# Patient Record
Sex: Male | Born: 1950
Health system: Southern US, Community
[De-identification: ages and names within clinical notes are randomized; demographics above are authoritative.]

## PROBLEM LIST (undated history)

## (undated) DIAGNOSIS — I10 Essential (primary) hypertension: Secondary | ICD-10-CM

## (undated) DIAGNOSIS — D72819 Decreased white blood cell count, unspecified: Secondary | ICD-10-CM

## (undated) DIAGNOSIS — E7800 Pure hypercholesterolemia, unspecified: Secondary | ICD-10-CM

## (undated) HISTORY — DX: Pure hypercholesterolemia, unspecified: E78.00

## (undated) HISTORY — DX: Essential (primary) hypertension: I10

## (undated) HISTORY — DX: Decreased white blood cell count, unspecified: D72.819

## (undated) HISTORY — PX: WISDOM TOOTH EXTRACTION: SHX21

---

## 2007-02-13 ENCOUNTER — Ambulatory Visit: Payer: Self-pay | Admitting: Gastroenterology

## 2007-02-13 LAB — HM COLONOSCOPY: HM Colonoscopy: NORMAL

## 2008-01-01 ENCOUNTER — Ambulatory Visit: Payer: Self-pay | Admitting: Internal Medicine

## 2008-01-16 ENCOUNTER — Ambulatory Visit: Payer: Self-pay | Admitting: Internal Medicine

## 2008-02-01 ENCOUNTER — Ambulatory Visit: Payer: Self-pay | Admitting: Internal Medicine

## 2008-03-03 ENCOUNTER — Ambulatory Visit: Payer: Self-pay | Admitting: Internal Medicine

## 2008-03-31 ENCOUNTER — Ambulatory Visit: Payer: Self-pay | Admitting: Internal Medicine

## 2010-10-13 LAB — LIPID PANEL: HDL: 47 mg/dL (ref 35–70)

## 2011-11-08 ENCOUNTER — Other Ambulatory Visit: Payer: Self-pay | Admitting: *Deleted

## 2011-11-08 MED ORDER — LOSARTAN POTASSIUM 25 MG PO TABS: 25.0000 mg | ORAL_TABLET | Freq: Every day | ORAL | Status: DC

## 2011-11-08 NOTE — Telephone Encounter (Signed)
Rx was faxed to pharmacy.  

## 2011-12-29 ENCOUNTER — Ambulatory Visit: Payer: Self-pay | Admitting: Internal Medicine

## 2012-01-12 ENCOUNTER — Ambulatory Visit: Payer: Self-pay | Admitting: Internal Medicine

## 2012-02-08 ENCOUNTER — Encounter: Payer: Self-pay | Admitting: *Deleted

## 2012-02-08 DIAGNOSIS — E7800 Pure hypercholesterolemia, unspecified: Secondary | ICD-10-CM

## 2012-02-08 DIAGNOSIS — D72819 Decreased white blood cell count, unspecified: Secondary | ICD-10-CM

## 2012-02-08 DIAGNOSIS — I10 Essential (primary) hypertension: Secondary | ICD-10-CM

## 2012-02-09 ENCOUNTER — Encounter: Payer: Self-pay | Admitting: Internal Medicine

## 2012-02-09 ENCOUNTER — Encounter: Payer: Self-pay | Admitting: *Deleted

## 2012-02-09 ENCOUNTER — Ambulatory Visit (INDEPENDENT_AMBULATORY_CARE_PROVIDER_SITE_OTHER): Payer: BC Managed Care – PPO | Admitting: Internal Medicine

## 2012-02-09 VITALS — BP 132/86 | HR 72 | Temp 97.7°F | Ht 72.0 in | Wt 161.0 lb

## 2012-02-09 DIAGNOSIS — D72819 Decreased white blood cell count, unspecified: Secondary | ICD-10-CM | POA: Insufficient documentation

## 2012-02-09 DIAGNOSIS — R5383 Other fatigue: Secondary | ICD-10-CM

## 2012-02-09 DIAGNOSIS — Z23 Encounter for immunization: Secondary | ICD-10-CM

## 2012-02-09 DIAGNOSIS — E7800 Pure hypercholesterolemia, unspecified: Secondary | ICD-10-CM | POA: Insufficient documentation

## 2012-02-09 DIAGNOSIS — I10 Essential (primary) hypertension: Secondary | ICD-10-CM | POA: Insufficient documentation

## 2012-02-09 DIAGNOSIS — R0602 Shortness of breath: Secondary | ICD-10-CM

## 2012-02-09 DIAGNOSIS — R5381 Other malaise: Secondary | ICD-10-CM

## 2012-02-09 LAB — COMPREHENSIVE METABOLIC PANEL
ALT: 28 U/L (ref 0–53)
AST: 30 U/L (ref 0–37)
Alkaline Phosphatase: 54 U/L (ref 39–117)
BUN: 11 mg/dL (ref 6–23)
Calcium: 9 mg/dL (ref 8.4–10.5)
Chloride: 99 mEq/L (ref 96–112)
Creatinine, Ser: 0.9 mg/dL (ref 0.4–1.5)
Total Bilirubin: 0.8 mg/dL (ref 0.3–1.2)

## 2012-02-09 LAB — CBC WITH DIFFERENTIAL/PLATELET
Basophils Absolute: 0 10*3/uL (ref 0.0–0.1)
Basophils Relative: 0.9 % (ref 0.0–3.0)
Eosinophils Absolute: 0 10*3/uL (ref 0.0–0.7)
Hemoglobin: 14.5 g/dL (ref 13.0–17.0)
Lymphocytes Relative: 39.5 % (ref 12.0–46.0)
Lymphs Abs: 1.7 10*3/uL (ref 0.7–4.0)
MCHC: 34.1 g/dL (ref 30.0–36.0)
MCV: 87.8 fl (ref 78.0–100.0)
Monocytes Absolute: 0.4 10*3/uL (ref 0.1–1.0)
Neutro Abs: 2.1 10*3/uL (ref 1.4–7.7)
RBC: 4.86 Mil/uL (ref 4.22–5.81)
RDW: 13 % (ref 11.5–14.6)

## 2012-02-09 LAB — LIPID PANEL
HDL: 59.2 mg/dL (ref >39.00)
Total CHOL/HDL Ratio: 4
Triglycerides: 91 mg/dL (ref 0.0–149.0)

## 2012-02-09 LAB — LDL CHOLESTEROL, DIRECT: Direct LDL: 129.9 mg/dL

## 2012-02-09 MED ORDER — LOSARTAN POTASSIUM 25 MG PO TABS: 25.0000 mg | ORAL_TABLET | Freq: Every day | ORAL | Status: DC

## 2012-02-10 ENCOUNTER — Other Ambulatory Visit: Payer: Self-pay | Admitting: Internal Medicine

## 2012-02-10 DIAGNOSIS — E871 Hypo-osmolality and hyponatremia: Secondary | ICD-10-CM

## 2012-02-10 NOTE — Progress Notes (Signed)
Order placed for follow up sodium

## 2012-02-12 ENCOUNTER — Encounter: Payer: Self-pay | Admitting: Internal Medicine

## 2012-02-12 NOTE — Assessment & Plan Note (Signed)
Low cholesterol diet.  Check lipid panel.    

## 2012-02-12 NOTE — Assessment & Plan Note (Signed)
Blood pressure under good control on outside checks.  Follow. Same medication regimen.  Check metabolic panel.

## 2012-02-12 NOTE — Progress Notes (Signed)
  Subjective:    Patient ID: Yoshua Geisinger, male    DOB: 1950/05/27, 62 y.o.   MRN: [id number]  HPI 62 year old male with past history of leukopenia (felt to be benign cyclical) and hypertension who comes in today to follow up on these issues as well as for a complete physical exam.  He states his blood pressure has been running 115-120 systolic readings.  Some increased stress, but he feels he is handling things relatively well.  He has noticed that at night  He has awakened with some light headedness.  May occur 1x/week.  Feels it is associated with dreaming.  He went swimming yesterday.  Has noticed ncreased sob with exertion.  Bowels stable.    Past Medical History  Diagnosis Date  . Hypercholesterolemia   . Hypertension   . Leukopenia     benign cyclical    Current Outpatient Prescriptions on File Prior to Visit  Medication Sig Dispense Refill  . aspirin EC 81 MG tablet Take 81 mg by mouth daily.      Marland Kitchen losartan (COZAAR) 25 MG tablet Take 1 tablet (25 mg total) by mouth daily.  90 tablet  3    Review of Systems Patient denies any headache.  Light headedness as outlined.  Does not occur during the day.   No chest pain, tightness or palpitations.  No cough or congestion.  Does report increased sob with exertion.  Feels this is increasing.  No nausea or vomiting.  No acid reflux.   No abdominal pain or cramping.  No bowel change, such as diarrhea, constipation, BRBPR or melana.  No urine change.        Objective:   Physical Exam Filed Vitals:   02/09/12 0809  BP: 132/86  Pulse: 72  Temp: 97.7 F (39.51 C)   62 year old male in no acute distress.  HEENT:  Nares - clear.  Oropharynx - without lesions. NECK:  Supple.  Nontender.  No audible carotid bruit.  HEART:  Appears to be regular.   LUNGS:  No crackles or wheezing audible.  Respirations even and unlabored.   RADIAL PULSE:  Equal bilaterally.  ABDOMEN:  Soft.  Nontender.  Bowel sounds present and normal.  No audible abdominal  bruit.  GU:  Performed by Dr Lonna Cobb.   EXTREMITIES:  No increased edema present.  DP pulses palpable and equal bilaterally.          Assessment & Plan:  CARDIOVASCULAR.  SOB with exertion as outlined.  EKG obtained and revealed SR with no acute ischemic changes.  Will schedule stress echo to confirm no active ischemia.  Continue risk factor modification.    GU.  Has seen Dr Lonna Cobb.  Continues to follow with him regarding prostate checks and psa's.    PREVIOUS WEIGHT LOSS.  Weight is up from last weight at [facility] (155).  Follow.  Eating well.    HEALTH MAINTENANCE.  Physical today.  GU exams and PSA - Dr Lonna Cobb.  Colonoscopy 02/13/07 - normal.  IFOB today.  Flu shot given today.

## 2012-02-12 NOTE — Assessment & Plan Note (Signed)
Felt to be benign cyclical leukopenia.  Recheck cbc.

## 2012-03-08 ENCOUNTER — Telehealth: Payer: Self-pay | Admitting: Internal Medicine

## 2012-03-08 NOTE — Telephone Encounter (Signed)
Patient wanted to know if we have received his [place] yet? I did not see that we have received it.

## 2012-03-08 NOTE — Telephone Encounter (Signed)
Will need to call and get stress test results.  He must have had the test at [facility].  Notify pt we will call and get results.

## 2012-03-08 NOTE — Telephone Encounter (Signed)
Pt is calling and says he never received his test results and would like a call back with those.

## 2012-03-12 ENCOUNTER — Other Ambulatory Visit (INDEPENDENT_AMBULATORY_CARE_PROVIDER_SITE_OTHER): Payer: BC Managed Care – PPO

## 2012-03-12 DIAGNOSIS — E871 Hypo-osmolality and hyponatremia: Secondary | ICD-10-CM

## 2012-03-12 NOTE — Telephone Encounter (Signed)
Pt came in today for labs and wanted to get stress test results  Best number [phone]

## 2012-03-12 NOTE — Telephone Encounter (Signed)
Patient notified. Also mailed copy to patient.

## 2012-06-08 ENCOUNTER — Ambulatory Visit (INDEPENDENT_AMBULATORY_CARE_PROVIDER_SITE_OTHER): Payer: BC Managed Care – PPO | Admitting: Internal Medicine

## 2012-06-08 ENCOUNTER — Encounter: Payer: Self-pay | Admitting: Internal Medicine

## 2012-06-08 VITALS — BP 130/80 | HR 74 | Temp 98.0°F | Ht 72.0 in | Wt 162.5 lb

## 2012-06-08 DIAGNOSIS — E7800 Pure hypercholesterolemia, unspecified: Secondary | ICD-10-CM

## 2012-06-08 DIAGNOSIS — I10 Essential (primary) hypertension: Secondary | ICD-10-CM

## 2012-06-08 DIAGNOSIS — D72819 Decreased white blood cell count, unspecified: Secondary | ICD-10-CM

## 2012-06-08 MED ORDER — LOSARTAN POTASSIUM 50 MG PO TABS: 50.0000 mg | ORAL_TABLET | Freq: Every day | ORAL | Status: DC

## 2012-06-10 ENCOUNTER — Encounter: Payer: Self-pay | Admitting: Internal Medicine

## 2012-06-10 NOTE — Assessment & Plan Note (Signed)
Felt to be benign cyclical leukopenia.  Recheck cbc.

## 2012-06-10 NOTE — Assessment & Plan Note (Signed)
Low cholesterol diet.  Check lipid panel.    

## 2012-06-10 NOTE — Assessment & Plan Note (Signed)
Blood pressure as outlined.  Will go ahead and increase cozaar to 50mg  q day.  Follow pressures.  Get him back in soon to reassess.   Check metabolic panel.

## 2012-06-10 NOTE — Progress Notes (Signed)
  Subjective:    Patient ID: Scott Cohen, male    DOB: 1950/04/23, 62 y.o.   MRN: [id number]  HPI 62 year old male with past history of leukopenia (felt to be benign cyclical) and hypertension who comes in today for a scheduled follow up.  He states his blood pressure has been running 135-145 systolic readings.  Some increased stress, but he feels he is handling things relatively well.  Sleeping well.  No chest pain or tightness.  States when he swims (freestyle only) - notices the feeling of not getting enough air.  When he does the back stroke or other swimming or exercise - has no problems with sob or doe.  States he feels it may be a mental thing - with trying to breathe properly (putting his head in and out of the water).  Overall he feels good and feels things are stable.    Past Medical History  Diagnosis Date  . Hypercholesterolemia   . Hypertension   . Leukopenia     benign cyclical    Outpatient Encounter Prescriptions as of 06/08/2012  Medication Sig Dispense Refill  . aspirin EC 81 MG tablet Take 81 mg by mouth daily.      Marland Kitchen losartan (COZAAR) 50 MG tablet Take 1 tablet (50 mg total) by mouth daily.  30 tablet  4  . [DISCONTINUED] losartan (COZAAR) 25 MG tablet Take 1 tablet (25 mg total) by mouth daily.  90 tablet  3   No facility-administered encounter medications on file as of 06/08/2012.    Review of Systems Patient denies any headache, light headedness or dizziness.  No significant sinus or allergy symptoms.   No chest pain, tightness or palpitations.  No cough or congestion. Breathing as outlined.  No nausea or vomiting.  No acid reflux.   No abdominal pain or cramping.  No bowel change, such as diarrhea, constipation, BRBPR or melana.  No urine change.  Blood pressure as outlined.      Objective:   Physical Exam  Filed Vitals:   06/08/12 0803  BP: 130/80  Pulse: 74  Temp: 98 F (36.7 C)   Blood pressure recheck:  70/32  62 year old male in no acute distress.   HEENT:  Nares - clear.  Oropharynx - without lesions. NECK:  Supple.  Nontender.  No audible carotid bruit.  HEART:  Appears to be regular.   LUNGS:  No crackles or wheezing audible.  Respirations even and unlabored.   RADIAL PULSE:  Equal bilaterally.  ABDOMEN:  Soft.  Nontender.  Bowel sounds present and normal.  No audible abdominal bruit.  EXTREMITIES:  No increased edema present.  DP pulses palpable and equal bilaterally.          Assessment & Plan:  CARDIOVASCULAR.  Stress echo recently revealed no ischemia.  Currently doing well.  Follow.  The sob is only noted when he is swimming freestyle and he is worried about breathing in and out of water.  Does not notice at other times during exertion.  Will hold on further w/up.  Pt comfortable with this plan.   GU.  Has seen Dr Lonna Cobb.  Continues to follow with him regarding prostate checks and psa's.    PREVIOUS WEIGHT LOSS.  Weight is up from last weight.   Follow.  Eating well.    HEALTH MAINTENANCE.  Physical 02/09/12.  GU exams and PSA - Dr Lonna Cobb.  Colonoscopy 02/13/07 - normal.

## 2012-07-12 ENCOUNTER — Encounter: Payer: Self-pay | Admitting: Internal Medicine

## 2012-08-22 ENCOUNTER — Other Ambulatory Visit (INDEPENDENT_AMBULATORY_CARE_PROVIDER_SITE_OTHER): Payer: BC Managed Care – PPO

## 2012-08-22 ENCOUNTER — Encounter: Payer: Self-pay | Admitting: Internal Medicine

## 2012-08-22 DIAGNOSIS — D72819 Decreased white blood cell count, unspecified: Secondary | ICD-10-CM

## 2012-08-22 DIAGNOSIS — I10 Essential (primary) hypertension: Secondary | ICD-10-CM

## 2012-08-22 DIAGNOSIS — E7800 Pure hypercholesterolemia, unspecified: Secondary | ICD-10-CM

## 2012-08-22 LAB — BASIC METABOLIC PANEL
CO2: 27 mEq/L (ref 19–32)
Calcium: 9.3 mg/dL (ref 8.4–10.5)
Chloride: 103 mEq/L (ref 96–112)
Creatinine, Ser: 0.9 mg/dL (ref 0.4–1.5)
Glucose, Bld: 90 mg/dL (ref 70–99)

## 2012-08-22 LAB — CBC WITH DIFFERENTIAL/PLATELET
Basophils Absolute: 0 10*3/uL (ref 0.0–0.1)
Eosinophils Relative: 1.5 % (ref 0.0–5.0)
HCT: 40.5 % (ref 39.0–52.0)
Hemoglobin: 13.9 g/dL (ref 13.0–17.0)
Lymphocytes Relative: 47 % — ABNORMAL HIGH (ref 12.0–46.0)
Lymphs Abs: 1.5 10*3/uL (ref 0.7–4.0)
Monocytes Relative: 11.8 % (ref 3.0–12.0)
Neutro Abs: 1.3 10*3/uL — ABNORMAL LOW (ref 1.4–7.7)
RBC: 4.57 Mil/uL (ref 4.22–5.81)
WBC: 3.3 10*3/uL — ABNORMAL LOW (ref 4.5–10.5)

## 2012-08-22 LAB — LIPID PANEL
HDL: 57.6 mg/dL (ref >39.00)
Total CHOL/HDL Ratio: 4
Triglycerides: 120 mg/dL (ref 0.0–149.0)

## 2012-08-22 LAB — LDL CHOLESTEROL, DIRECT: Direct LDL: 144.2 mg/dL

## 2012-09-10 ENCOUNTER — Ambulatory Visit (INDEPENDENT_AMBULATORY_CARE_PROVIDER_SITE_OTHER): Payer: BC Managed Care – PPO | Admitting: Internal Medicine

## 2012-09-10 ENCOUNTER — Encounter: Payer: Self-pay | Admitting: Internal Medicine

## 2012-09-10 VITALS — BP 120/80 | HR 71 | Temp 97.8°F | Ht 72.0 in | Wt 160.8 lb

## 2012-09-10 DIAGNOSIS — D72819 Decreased white blood cell count, unspecified: Secondary | ICD-10-CM

## 2012-09-10 DIAGNOSIS — I10 Essential (primary) hypertension: Secondary | ICD-10-CM

## 2012-09-10 DIAGNOSIS — E7800 Pure hypercholesterolemia, unspecified: Secondary | ICD-10-CM

## 2012-09-10 MED ORDER — LOSARTAN POTASSIUM 50 MG PO TABS: 50.0000 mg | ORAL_TABLET | Freq: Every day | ORAL | Status: DC

## 2012-09-10 NOTE — Assessment & Plan Note (Signed)
Low cholesterol diet.  Follow lipid panel.  Last lipid panel elevated some.

## 2012-09-10 NOTE — Assessment & Plan Note (Signed)
Blood pressure as outlined.  Appears to be better.  Continue current medication regimen.  Follow.

## 2012-09-10 NOTE — Progress Notes (Signed)
  Subjective:    Patient ID: Scott Cohen, male    DOB: September 08, 1950, 62 y.o.   MRN: [id number]  HPI 62 year old male with past history of leukopenia (felt to be benign cyclical) and hypertension who comes in today for a scheduled follow up.   Just changed jobs.  Working for [employer] now.  Some increased stress, but he feels he is handling things relatively well.  No chest pain or tightness.  Breathing stable.  Watching his cholesterol.  Bowels stable.  Overall he feels good and feels things are stable.    Past Medical History  Diagnosis Date  . Hypercholesterolemia   . Hypertension   . Leukopenia     benign cyclical    Outpatient Encounter Prescriptions as of 09/10/2012  Medication Sig Dispense Refill  . aspirin EC 81 MG tablet Take 81 mg by mouth daily.      Marland Kitchen losartan (COZAAR) 50 MG tablet Take 1 tablet (50 mg total) by mouth daily.  30 tablet  4   No facility-administered encounter medications on file as of 09/10/2012.    Review of Systems Patient denies any headache, light headedness or dizziness.  No significant sinus or allergy symptoms.   No chest pain, tightness or palpitations.  No cough or congestion.  Breathing as outlined.  No nausea or vomiting.  No acid reflux.   No abdominal pain or cramping.  No bowel change, such as diarrhea, constipation, BRBPR or melana.  No urine change.       Objective:   Physical Exam  Filed Vitals:   09/10/12 0803  BP: 120/80  Pulse: 71  Temp: 97.8 F (54.42 C)   62 year old male in no acute distress.  HEENT:  Nares - clear.  Oropharynx - without lesions. NECK:  Supple.  Nontender.  No audible carotid bruit.  HEART:  Appears to be regular.   LUNGS:  No crackles or wheezing audible.  Respirations even and unlabored.   RADIAL PULSE:  Equal bilaterally.  ABDOMEN:  Soft.  Nontender.  Bowel sounds present and normal.  No audible abdominal bruit.  EXTREMITIES:  No increased edema present.  DP pulses palpable and equal bilaterally.           Assessment & Plan:  CARDIOVASCULAR.  Stress echo recently revealed no ischemia.  Currently doing well.  Follow.   GU.  Has seen Dr Lonna Cobb.  Continues to follow with him regarding prostate checks and psa's.    PREVIOUS WEIGHT LOSS.  Weight relatively stable.   Follow.  Eating well.    HEALTH MAINTENANCE.  Physical 02/09/12.  GU exams and PSA - Dr Lonna Cobb.  Colonoscopy 02/13/07 - normal.

## 2012-09-10 NOTE — Assessment & Plan Note (Signed)
Felt to be benign cyclical leukopenia.  Follow cbc.

## 2012-12-06 ENCOUNTER — Other Ambulatory Visit: Payer: Self-pay

## 2013-03-07 ENCOUNTER — Other Ambulatory Visit: Payer: BC Managed Care – PPO

## 2013-03-12 ENCOUNTER — Other Ambulatory Visit: Payer: Self-pay | Admitting: Internal Medicine

## 2013-03-12 ENCOUNTER — Ambulatory Visit (INDEPENDENT_AMBULATORY_CARE_PROVIDER_SITE_OTHER): Payer: BC Managed Care – PPO | Admitting: Internal Medicine

## 2013-03-12 ENCOUNTER — Encounter: Payer: Self-pay | Admitting: Internal Medicine

## 2013-03-12 VITALS — BP 130/90 | HR 75 | Temp 97.6°F | Ht 71.5 in | Wt 158.8 lb

## 2013-03-12 DIAGNOSIS — E7800 Pure hypercholesterolemia, unspecified: Secondary | ICD-10-CM

## 2013-03-12 DIAGNOSIS — I10 Essential (primary) hypertension: Secondary | ICD-10-CM

## 2013-03-12 DIAGNOSIS — E559 Vitamin D deficiency, unspecified: Secondary | ICD-10-CM

## 2013-03-12 DIAGNOSIS — D72819 Decreased white blood cell count, unspecified: Secondary | ICD-10-CM

## 2013-03-12 DIAGNOSIS — Z1211 Encounter for screening for malignant neoplasm of colon: Secondary | ICD-10-CM

## 2013-03-12 NOTE — Progress Notes (Signed)
  Subjective:    Patient ID: Scott Cohen, male    DOB: 1950/10/20, 63 y.o.   MRN: [id number]  HPI 63 year old male with past history of leukopenia (felt to be benign cyclical) and hypertension who comes in today to follow up on these issues as well as for a complete physical exam.   Has changed jobs.  Working for [employer] now.  Job is going well.   No chest pain or tightness.  Breathing stable.  Watching his cholesterol.  Bowels stable.  Overall he feels good and feels things are stable.  Wakes up some - early am.  Not exercising.  Plans to restart.  Feels this will help.    Past Medical History  Diagnosis Date  . Hypercholesterolemia   . Hypertension   . Leukopenia     benign cyclical    Outpatient Encounter Prescriptions as of 03/12/2013  Medication Sig  . aspirin EC 81 MG tablet Take 81 mg by mouth daily.  Marland Kitchen. losartan (COZAAR) 50 MG tablet Take 1 tablet (50 mg total) by mouth daily.    Review of Systems Patient denies any headache, light headedness or dizziness.  No significant sinus or allergy symptoms.   No chest pain, tightness or palpitations.  No cough or congestion.  No sob.  No nausea or vomiting.  No acid reflux.   No abdominal pain or cramping.  No bowel change, such as diarrhea, constipation, BRBPR or melana.  No urine change.  Overall feels good. Plans to start exercising.       Objective:   Physical Exam  Filed Vitals:   03/12/13 0839  BP: 130/90  Pulse: 75  Temp: 97.6 F (36.4 C)   Blood pressure recheck:  72134/4384  63 year old male in no acute distress.   HEENT:  Nares- clear.  Oropharynx - without lesions. NECK:  Supple.  Nontender.  No audible bruit.  HEART:  Appears to be regular. LUNGS:  No crackles or wheezing audible.  Respirations even and unlabored.  RADIAL PULSE:  Equal bilaterally.    BREASTS:  No nipple discharge or nipple retraction present.  Could not appreciate any distinct nodules or axillary adenopathy.  ABDOMEN:  Soft, nontender.  Bowel sounds  present and normal.  No audible abdominal bruit.  GU:  Normal external genitalia.  Vaginal vault without lesions.  Cervix identified.  Pap performed. Could not appreciate any adnexal masses or tenderness.   RECTAL:  Heme negative.   EXTREMITIES:  No increased edema present.  DP pulses palpable and equal bilaterally.          Assessment & Plan:  CARDIOVASCULAR.  Stress echo recently revealed no ischemia.  Currently doing well.  Follow.   GU.  Has seen Dr Lonna CobbStoioff.  Will start following here for f/u prostate checks and PSA.     PREVIOUS WEIGHT LOSS.  Weight relatively stable.   Follow.  Eating well.    HEALTH MAINTENANCE.  Physical today.  Check psa with next lab.   Colonoscopy 02/13/07 - normal.  IFOB given today.

## 2013-03-12 NOTE — Assessment & Plan Note (Signed)
Blood pressure as outlined.  Outside checks averaging 120-125/<90.   Continue current medication regimen.  Follow.

## 2013-03-12 NOTE — Assessment & Plan Note (Signed)
White blood cell count 3.1 recently.  Recheck cbc in 2 months to confirm stable.

## 2013-03-12 NOTE — Assessment & Plan Note (Signed)
Vitamin D level decreased.  Start vitamin D3 1000 q day.  Follow.

## 2013-03-12 NOTE — Progress Notes (Signed)
Ordered placed for labs.   

## 2013-03-12 NOTE — Assessment & Plan Note (Signed)
Low cholesterol diet.  Follow lipid panel.  Last LDL 112.

## 2013-03-12 NOTE — Progress Notes (Signed)
Pre-visit discussion using our clinic review tool. No additional management support is needed unless otherwise documented below in the visit note.  

## 2013-03-27 ENCOUNTER — Other Ambulatory Visit (INDEPENDENT_AMBULATORY_CARE_PROVIDER_SITE_OTHER): Payer: BC Managed Care – PPO

## 2013-03-27 DIAGNOSIS — Z1211 Encounter for screening for malignant neoplasm of colon: Secondary | ICD-10-CM

## 2013-03-27 LAB — FECAL OCCULT BLOOD, IMMUNOCHEMICAL: Fecal Occult Bld: NEGATIVE

## 2013-03-28 ENCOUNTER — Encounter: Payer: Self-pay | Admitting: Internal Medicine

## 2013-07-11 ENCOUNTER — Ambulatory Visit (INDEPENDENT_AMBULATORY_CARE_PROVIDER_SITE_OTHER): Payer: BC Managed Care – PPO | Admitting: Internal Medicine

## 2013-07-11 ENCOUNTER — Encounter: Payer: Self-pay | Admitting: Internal Medicine

## 2013-07-11 VITALS — BP 130/70 | HR 71 | Temp 97.9°F | Ht 71.5 in | Wt 166.2 lb

## 2013-07-11 DIAGNOSIS — D72819 Decreased white blood cell count, unspecified: Secondary | ICD-10-CM

## 2013-07-11 DIAGNOSIS — E559 Vitamin D deficiency, unspecified: Secondary | ICD-10-CM

## 2013-07-11 DIAGNOSIS — I10 Essential (primary) hypertension: Secondary | ICD-10-CM

## 2013-07-11 DIAGNOSIS — E7800 Pure hypercholesterolemia, unspecified: Secondary | ICD-10-CM

## 2013-07-11 MED ORDER — CLOTRIMAZOLE 1 % EX CREA: 1.0000 "application " | TOPICAL_CREAM | Freq: Two times a day (BID) | CUTANEOUS | Status: DC

## 2013-07-11 NOTE — Progress Notes (Signed)
Pre visit review using our clinic review tool, if applicable. No additional management support is needed unless otherwise documented below in the visit note. 

## 2013-07-11 NOTE — Progress Notes (Signed)
  Subjective:    Patient ID: Scott Cohen, male    DOB: [birth year], 63 y.o.   MRN: [id number]  HPI 63 year old male with past history of leukopenia (felt to be benign cyclical) and hypertension who comes in today for a scheduled follow up.  Has changed jobs.  Working for [employer] now.  Job is going well.   No chest pain or tightness.  Breathing stable.  Watching his cholesterol.  Bowels stable.  Overall he feels good and feels things are stable.  Not exercising.  Plans to restart.  Is going to be involved in a research study at [place] (exercise study).    Past Medical History  Diagnosis Date  . Hypercholesterolemia   . Hypertension   . Leukopenia     benign cyclical    Outpatient Encounter Prescriptions as of 07/11/2013  Medication Sig  . aspirin EC 81 MG tablet Take 81 mg by mouth daily.  Marland Kitchen losartan (COZAAR) 50 MG tablet Take 1 tablet (50 mg total) by mouth daily.  . clotrimazole (LOTRIMIN) 1 % cream Apply 1 application topically 2 (two) times daily.    Review of Systems Patient denies any headache, light headedness or dizziness.  No significant sinus or allergy symptoms.   No chest pain, tightness or palpitations.  No cough or congestion.  No sob.  No nausea or vomiting.  No acid reflux.   No abdominal pain or cramping.  No bowel change, such as diarrhea, constipation, BRBPR or melana.  No urine change.  Overall feels good. Plans to start exercising.       Objective:   Physical Exam  Filed Vitals:   07/11/13 0756  BP: 130/70  Pulse: 71  Temp: 97.9 F (36.6 C)   Blood pressure recheck:  70/82  63 year old male in no acute distress.   HEENT:  Nares- clear.  Oropharynx - without lesions. NECK:  Supple.  Nontender.  No audible bruit.  HEART:  Appears to be regular. LUNGS:  No crackles or wheezing audible.  Respirations even and unlabored.  RADIAL PULSE:  Equal bilaterally.  ABDOMEN:  Soft, nontender.  Bowel sounds present and normal.  No audible abdominal bruit.  EXTREMITIES:  No  increased edema present.  DP pulses palpable and equal bilaterally.          Assessment & Plan:  CARDIOVASCULAR.  Stress echo recently revealed no ischemia.  Currently doing well.  Follow.   GU.  Has seen Dr Lonna Cobb.  Will start following here for f/u prostate checks and PSA.  PSA 07/04/13 - .5.    PREVIOUS WEIGHT LOSS.  Weight up a few pounds from last check.   Follow.  Eating well.    HEALTH MAINTENANCE.  Physical 03/12/13.  PSA 07/04/13 .5.   Colonoscopy 02/13/07 - normal.  IFOB negative 03/17/13.

## 2013-07-14 ENCOUNTER — Encounter: Payer: Self-pay | Admitting: Internal Medicine

## 2013-07-14 NOTE — Assessment & Plan Note (Signed)
Low cholesterol diet.  Follow lipid panel.    

## 2013-07-14 NOTE — Assessment & Plan Note (Signed)
White blood cell count just checked 3.5.  Stable.

## 2013-07-14 NOTE — Assessment & Plan Note (Signed)
Vitamin D level previously decreased.  Taking vitamin D3 1000 q day.  Follow.

## 2013-07-14 NOTE — Assessment & Plan Note (Signed)
Blood pressure doing well.  Continue current medication regimen.  Follow.  

## 2013-07-24 ENCOUNTER — Encounter: Payer: Self-pay | Admitting: Internal Medicine

## 2013-08-18 ENCOUNTER — Telehealth: Payer: Self-pay | Admitting: Internal Medicine

## 2013-08-18 NOTE — Telephone Encounter (Signed)
Pt notified of lab results via my chart.  Cholesterol improved.

## 2013-08-31 ENCOUNTER — Encounter: Payer: Self-pay | Admitting: Internal Medicine

## 2013-09-08 ENCOUNTER — Other Ambulatory Visit: Payer: Self-pay | Admitting: Internal Medicine

## 2014-03-03 ENCOUNTER — Encounter: Payer: Self-pay | Admitting: Internal Medicine

## 2014-03-14 ENCOUNTER — Other Ambulatory Visit: Payer: Self-pay | Admitting: Internal Medicine

## 2014-04-22 ENCOUNTER — Encounter: Payer: Self-pay | Admitting: Internal Medicine

## 2014-04-29 ENCOUNTER — Ambulatory Visit: Payer: Self-pay | Admitting: Internal Medicine

## 2014-06-02 ENCOUNTER — Telehealth: Payer: Self-pay | Admitting: Internal Medicine

## 2014-06-02 DIAGNOSIS — D72819 Decreased white blood cell count, unspecified: Secondary | ICD-10-CM

## 2014-06-02 DIAGNOSIS — I10 Essential (primary) hypertension: Secondary | ICD-10-CM

## 2014-06-02 DIAGNOSIS — E7800 Pure hypercholesterolemia, unspecified: Secondary | ICD-10-CM

## 2014-06-02 NOTE — Telephone Encounter (Signed)
[facility]  Does he want them ordered here of rx [facility]

## 2014-06-02 NOTE — Telephone Encounter (Signed)
Pt request to have blood work done prior to appt. Please advise

## 2014-06-04 NOTE — Telephone Encounter (Signed)
Order placed for labs.

## 2014-06-04 NOTE — Telephone Encounter (Signed)
Pt new insurance plan doesn't allow labs to be drawn in [place]. Needs labs drawn here. I scheduled him a fasting lab appt on: 06/27/14 (Fri) @ 9:00 AM.  (Needs orders placed)

## 2014-06-14 ENCOUNTER — Other Ambulatory Visit: Payer: Self-pay | Admitting: Internal Medicine

## 2014-06-16 ENCOUNTER — Other Ambulatory Visit: Payer: Self-pay | Admitting: Internal Medicine

## 2014-06-16 NOTE — Telephone Encounter (Signed)
Appt 07/03/14

## 2014-06-16 NOTE — Telephone Encounter (Signed)
Refilled losartan #90 with no refills.

## 2014-06-16 NOTE — Telephone Encounter (Signed)
Last OV 6.11.15, nest OV 6.2.16.  Please advise refill

## 2014-06-24 ENCOUNTER — Encounter: Payer: Self-pay | Admitting: Internal Medicine

## 2014-06-27 ENCOUNTER — Other Ambulatory Visit (INDEPENDENT_AMBULATORY_CARE_PROVIDER_SITE_OTHER): Payer: BLUE CROSS/BLUE SHIELD

## 2014-06-27 DIAGNOSIS — I10 Essential (primary) hypertension: Secondary | ICD-10-CM

## 2014-06-27 DIAGNOSIS — E7800 Pure hypercholesterolemia, unspecified: Secondary | ICD-10-CM

## 2014-06-27 DIAGNOSIS — D72819 Decreased white blood cell count, unspecified: Secondary | ICD-10-CM

## 2014-06-27 LAB — BASIC METABOLIC PANEL
BUN: 13 mg/dL (ref 6–23)
CALCIUM: 9.2 mg/dL (ref 8.4–10.5)
CHLORIDE: 98 meq/L (ref 96–112)
CO2: 28 meq/L (ref 19–32)
CREATININE: 0.86 mg/dL (ref 0.40–1.50)
GFR: 95.22 mL/min (ref >60.00)
Glucose, Bld: 95 mg/dL (ref 70–99)
Potassium: 4.5 mEq/L (ref 3.5–5.1)
SODIUM: 132 meq/L — AB (ref 135–145)

## 2014-06-27 LAB — LIPID PANEL
Cholesterol: 218 mg/dL — ABNORMAL HIGH (ref 0–200)
HDL: 65 mg/dL (ref >39.00)
LDL Cholesterol: 134 mg/dL — ABNORMAL HIGH (ref 0–99)
NonHDL: 153
Total CHOL/HDL Ratio: 3
Triglycerides: 94 mg/dL (ref 0.0–149.0)
VLDL: 18.8 mg/dL (ref 0.0–40.0)

## 2014-06-27 LAB — CBC WITH DIFFERENTIAL/PLATELET
BASOS PCT: 1.1 % (ref 0.0–3.0)
Basophils Absolute: 0 10*3/uL (ref 0.0–0.1)
Eosinophils Absolute: 0.1 10*3/uL (ref 0.0–0.7)
Eosinophils Relative: 1.4 % (ref 0.0–5.0)
HCT: 42.1 % (ref 39.0–52.0)
Hemoglobin: 14.5 g/dL (ref 13.0–17.0)
LYMPHS ABS: 1.8 10*3/uL (ref 0.7–4.0)
LYMPHS PCT: 43.9 % (ref 12.0–46.0)
MCHC: 34.5 g/dL (ref 30.0–36.0)
MCV: 88.6 fl (ref 78.0–100.0)
MONO ABS: 0.5 10*3/uL (ref 0.1–1.0)
MONOS PCT: 11.8 % (ref 3.0–12.0)
NEUTROS ABS: 1.7 10*3/uL (ref 1.4–7.7)
Neutrophils Relative %: 41.8 % — ABNORMAL LOW (ref 43.0–77.0)
Platelets: 225 10*3/uL (ref 150.0–400.0)
RBC: 4.75 Mil/uL (ref 4.22–5.81)
RDW: 12.2 % (ref 11.5–15.5)
WBC: 4 10*3/uL (ref 4.0–10.5)

## 2014-06-27 LAB — HEPATIC FUNCTION PANEL
ALK PHOS: 54 U/L (ref 39–117)
ALT: 19 U/L (ref 0–53)
AST: 20 U/L (ref 0–37)
Albumin: 4.4 g/dL (ref 3.5–5.2)
BILIRUBIN DIRECT: 0.1 mg/dL (ref 0.0–0.3)
Total Bilirubin: 0.5 mg/dL (ref 0.2–1.2)
Total Protein: 6.8 g/dL (ref 6.0–8.3)

## 2014-06-27 LAB — TSH: TSH: 1.41 u[IU]/mL (ref 0.35–4.50)

## 2014-06-28 ENCOUNTER — Encounter: Payer: Self-pay | Admitting: Internal Medicine

## 2014-07-03 ENCOUNTER — Ambulatory Visit (INDEPENDENT_AMBULATORY_CARE_PROVIDER_SITE_OTHER): Payer: BLUE CROSS/BLUE SHIELD | Admitting: Internal Medicine

## 2014-07-03 ENCOUNTER — Encounter: Payer: Self-pay | Admitting: Internal Medicine

## 2014-07-03 VITALS — BP 120/80 | HR 65 | Temp 97.9°F | Ht 71.0 in | Wt 161.0 lb

## 2014-07-03 DIAGNOSIS — E7800 Pure hypercholesterolemia, unspecified: Secondary | ICD-10-CM

## 2014-07-03 DIAGNOSIS — Z125 Encounter for screening for malignant neoplasm of prostate: Secondary | ICD-10-CM | POA: Diagnosis not present

## 2014-07-03 DIAGNOSIS — D72819 Decreased white blood cell count, unspecified: Secondary | ICD-10-CM

## 2014-07-03 DIAGNOSIS — E871 Hypo-osmolality and hyponatremia: Secondary | ICD-10-CM

## 2014-07-03 DIAGNOSIS — E559 Vitamin D deficiency, unspecified: Secondary | ICD-10-CM

## 2014-07-03 DIAGNOSIS — I10 Essential (primary) hypertension: Secondary | ICD-10-CM

## 2014-07-03 DIAGNOSIS — Z0000 Encounter for general adult medical examination without abnormal findings: Secondary | ICD-10-CM | POA: Diagnosis not present

## 2014-07-03 NOTE — Progress Notes (Signed)
Patient ID: Scott GessDavid A Cohen, male   DOB: 06/30/1950, 64 y.o.   MRN: [id number]   Subjective:    Patient ID: Scott Cohen, male    DOB: 06/30/1950, 64 y.o.   MRN: [id number]  HPI  Patient here to follow up on his current medical issues as well as for a complete physical exam.  Job is going well.  He and his wife are taking care of their grandchildren.  Not exercising on a regular basis.  Left knee bothers him at times.  Notices if he turns fast.  No significant pain. Stays active.  No cardiac symptoms with increased activity or exertion.  Breathing stable.  Bowels stable.     Past Medical History  Diagnosis Date  . Hypercholesterolemia   . Hypertension   . Leukopenia     benign cyclical    Current Outpatient Prescriptions on File Prior to Visit  Medication Sig Dispense Refill  . aspirin EC 81 MG tablet Take 81 mg by mouth daily.    . clotrimazole (LOTRIMIN) 1 % cream Apply 1 application topically 2 (two) times daily. 30 g 0  . losartan (COZAAR) 50 MG tablet TAKE ONE TABLET BY MOUTH ONCE DAILY 90 tablet 0   No current facility-administered medications on file prior to visit.    Review of Systems  Constitutional: Negative for appetite change and unexpected weight change.  HENT: Negative for congestion and sinus pressure.   Eyes: Negative for pain and visual disturbance.  Respiratory: Negative for cough, chest tightness and shortness of breath.   Cardiovascular: Negative for chest pain, palpitations and leg swelling.  Gastrointestinal: Negative for nausea, vomiting, abdominal pain and diarrhea.  Genitourinary: Negative for dysuria and difficulty urinating.  Musculoskeletal: Negative for back pain.       Some left knee pain as outlined.   Skin: Negative for color change and rash.  Neurological: Negative for dizziness, light-headedness and headaches.  Hematological: Negative for adenopathy. Does not bruise/bleed easily.  Psychiatric/Behavioral: Negative for dysphoric mood and agitation.         Objective:     Blood pressure recheck:  132/74  Physical Exam  Constitutional: He is oriented to person, place, and time. He appears well-developed and well-nourished. No distress.  HENT:  Head: Normocephalic and atraumatic.  Nose: Nose normal.  Mouth/Throat: Oropharynx is clear and moist. No oropharyngeal exudate.  Eyes: Conjunctivae are normal. Right eye exhibits no discharge. Left eye exhibits no discharge.  Neck: Neck supple. No thyromegaly present.  Cardiovascular: Normal rate and regular rhythm.   Pulmonary/Chest: Breath sounds normal. No respiratory distress. He has no wheezes.  Abdominal: Soft. Bowel sounds are normal. There is no tenderness.  Genitourinary:  Rectal exam - no palpable prostate nodules.  Heme negative.   Musculoskeletal: He exhibits no edema or tenderness.  Lymphadenopathy:    He has no cervical adenopathy.  Neurological: He is alert and oriented to person, place, and time.  Skin: Skin is warm and dry. No rash noted.  Psychiatric: He has a normal mood and affect. His behavior is normal.    BP 120/80 mmHg  Pulse 65  Temp(Src) 97.9 F (36.6 C) (Oral)  Ht 5\' 11"  (1.803 m)  Wt 161 lb (73.029 kg)  BMI 22.46 kg/m2  SpO2 97% Wt Readings from Last 3 Encounters:  07/03/14 161 lb (73.029 kg)  07/11/13 166 lb 4 oz (75.411 kg)  03/12/13 158 lb 12 oz (72.009 kg)     Lab Results  Component Value Date  WBC 4.0 06/27/2014   HGB 14.5 06/27/2014   HCT 42.1 06/27/2014   PLT 225.0 06/27/2014   GLUCOSE 95 06/27/2014   CHOL 218* 06/27/2014   TRIG 94.0 06/27/2014   HDL 65.00 06/27/2014   LDLDIRECT 144.2 08/22/2012   LDLCALC 134* 06/27/2014   ALT 19 06/27/2014   AST 20 06/27/2014   NA 136 07/03/2014   K 4.5 06/27/2014   CL 98 06/27/2014   CREATININE 0.86 06/27/2014   BUN 13 06/27/2014   CO2 28 06/27/2014   TSH 1.41 06/27/2014   PSA 0.50 07/03/2014       Assessment & Plan:   Problem List Items Addressed This Visit    Health care  maintenance    Physical today 07/03/14.  Check psa today.  Colonoscopy 1/13/12009 - normal.        Hypercholesterolemia    Low cholesterol diet and exercise.  Follow lipid panel.  Discussed exercise.        Hypertension    Blood pressure doing well.  Same medication regimen.  Follow pressures.  Follow metabolic panel.       Leukopenia    White count wnl 06/27/14.        Vitamin D deficiency - Primary    Recheck vitamin D level with these labs.       Relevant Orders   Vit D  25 hydroxy (rtn osteoporosis monitoring) (Completed)    Other Visit Diagnoses    Prostate cancer screening        Relevant Orders    PSA (Completed)    Hyponatremia        Relevant Orders    Sodium (Completed)      I spent 25 minutes with the patient and more than 50% of the time was spent in consultation regarding the above.     [place], MD

## 2014-07-03 NOTE — Progress Notes (Signed)
Pre visit review using our clinic review tool, if applicable. No additional management support is needed unless otherwise documented below in the visit note. 

## 2014-07-04 LAB — SODIUM: Sodium: 136 mEq/L (ref 135–145)

## 2014-07-04 LAB — VITAMIN D 25 HYDROXY (VIT D DEFICIENCY, FRACTURES): VITD: 22.42 ng/mL — AB (ref 30.00–100.00)

## 2014-07-04 LAB — PSA: PSA: 0.5 ng/mL (ref 0.10–4.00)

## 2014-07-06 ENCOUNTER — Encounter: Payer: Self-pay | Admitting: Internal Medicine

## 2014-07-13 ENCOUNTER — Encounter: Payer: Self-pay | Admitting: Internal Medicine

## 2014-07-13 DIAGNOSIS — Z0000 Encounter for general adult medical examination without abnormal findings: Secondary | ICD-10-CM | POA: Insufficient documentation

## 2014-07-13 NOTE — Assessment & Plan Note (Signed)
Recheck vitamin D level with these labs.

## 2014-07-13 NOTE — Assessment & Plan Note (Signed)
Physical today 07/03/14.  Check psa today.  Colonoscopy 1/13/12009 - normal.

## 2014-07-13 NOTE — Assessment & Plan Note (Signed)
Blood pressure doing well.  Same medication regimen.  Follow pressures.  Follow metabolic panel.   

## 2014-07-13 NOTE — Assessment & Plan Note (Signed)
Low cholesterol diet and exercise.  Follow lipid panel.  Discussed exercise.

## 2014-07-13 NOTE — Assessment & Plan Note (Signed)
White count wnl 06/27/14.

## 2014-09-02 ENCOUNTER — Encounter: Payer: Self-pay | Admitting: Internal Medicine

## 2014-09-02 NOTE — Telephone Encounter (Signed)
I am ok to change this to a physical.  Will you forward this to the correct person, or let me know what I need to do.   Thanks.

## 2014-09-02 NOTE — Telephone Encounter (Signed)
Please advise 

## 2014-09-17 NOTE — Telephone Encounter (Signed)
This has been forwarded to charge correction.

## 2014-09-24 ENCOUNTER — Encounter: Payer: Self-pay | Admitting: Internal Medicine

## 2014-09-25 NOTE — Telephone Encounter (Signed)
I am ok to change this to a physical.  Please forward to appropriate person.  Let me know if I need to do anything.

## 2014-10-15 ENCOUNTER — Other Ambulatory Visit: Payer: Self-pay | Admitting: Internal Medicine

## 2014-12-31 ENCOUNTER — Ambulatory Visit: Payer: BLUE CROSS/BLUE SHIELD | Admitting: Internal Medicine

## 2015-01-02 ENCOUNTER — Ambulatory Visit: Payer: BLUE CROSS/BLUE SHIELD | Admitting: Internal Medicine

## 2015-01-07 ENCOUNTER — Ambulatory Visit (INDEPENDENT_AMBULATORY_CARE_PROVIDER_SITE_OTHER): Payer: BLUE CROSS/BLUE SHIELD | Admitting: Internal Medicine

## 2015-01-07 ENCOUNTER — Encounter: Payer: Self-pay | Admitting: Internal Medicine

## 2015-01-07 VITALS — BP 122/80 | HR 69 | Temp 97.9°F | Resp 18 | Ht 71.0 in | Wt 155.5 lb

## 2015-01-07 DIAGNOSIS — D72819 Decreased white blood cell count, unspecified: Secondary | ICD-10-CM

## 2015-01-07 DIAGNOSIS — Z23 Encounter for immunization: Secondary | ICD-10-CM | POA: Diagnosis not present

## 2015-01-07 DIAGNOSIS — E559 Vitamin D deficiency, unspecified: Secondary | ICD-10-CM

## 2015-01-07 DIAGNOSIS — E7800 Pure hypercholesterolemia, unspecified: Secondary | ICD-10-CM | POA: Diagnosis not present

## 2015-01-07 DIAGNOSIS — I10 Essential (primary) hypertension: Secondary | ICD-10-CM | POA: Diagnosis not present

## 2015-01-07 NOTE — Patient Instructions (Signed)

## 2015-01-07 NOTE — Progress Notes (Signed)
Patient ID: Scott GessDavid A Cohen, male   DOB: 1950-06-10, 64 y.o.   MRN: [id number]   Subjective:    Patient ID: Scott Cohen, male    DOB: 1950-06-10, 64 y.o.   MRN: [id number]  HPI  Patient with past history of hypercholesterolemia and hypertension.  He comes in today to follow up on these issues.  He states he is doing well.  Stays active.  No cardiac symptoms with increased activity or exertion.  No sob.  No acid reflux.  No abdominal pain or cramping.  Bowels stable.   Is taking vitamin D supplements.  Handling stress well.    Past Medical History  Diagnosis Date  . Hypercholesterolemia   . Hypertension   . Leukopenia     benign cyclical   Past Surgical History  Procedure Laterality Date  . Wisdom tooth extraction     Family History  Problem Relation Age of Onset  . Heart disease Mother     pacemaker  . Heart disease Father     myocardial infarction  . Hypertension Father   . Diabetes Father   . CVA Father   . Diabetes Mother   . Colon cancer Neg Hx   . Prostate cancer Neg Hx    Social History   Social History  . Marital Status: Married    Spouse Name: N/A  . Number of Children: N/A  . Years of Education: N/A   Occupational History  .     Social History Main Topics  . Smoking status: Never Smoker   . Smokeless tobacco: Never Used  . Alcohol Use: 0.0 oz/week    0 Standard drinks or equivalent per week     Comment: occasional  . Drug Use: No  . Sexual Activity: Not Asked   Other Topics Concern  . None   Social History Narrative    Outpatient Encounter Prescriptions as of 01/07/2015  Medication Sig  . aspirin EC 81 MG tablet Take 81 mg by mouth daily.  Marland Kitchen. losartan (COZAAR) 50 MG tablet TAKE ONE TABLET BY MOUTH ONCE DAILY  . [DISCONTINUED] clotrimazole (LOTRIMIN) 1 % cream Apply 1 application topically 2 (two) times daily.  . [DISCONTINUED] losartan (COZAAR) 50 MG tablet TAKE ONE TABLET BY MOUTH ONCE DAILY   No facility-administered encounter medications on  file as of 01/07/2015.    Review of Systems  Constitutional: Negative for appetite change and unexpected weight change.  HENT: Negative for congestion and sinus pressure.   Respiratory: Negative for cough, chest tightness and shortness of breath.   Cardiovascular: Negative for chest pain, palpitations and leg swelling.  Gastrointestinal: Negative for nausea, vomiting, abdominal pain and diarrhea.  Skin: Negative for color change and rash.  Neurological: Negative for dizziness, light-headedness and headaches.  Psychiatric/Behavioral: Negative for dysphoric mood and agitation.       Objective:    Physical Exam  Constitutional: He appears well-developed and well-nourished. No distress.  HENT:  Nose: Nose normal.  Mouth/Throat: Oropharynx is clear and moist.  Eyes: Conjunctivae are normal. Right eye exhibits no discharge. Left eye exhibits no discharge.  Neck: Neck supple. No thyromegaly present.  Cardiovascular: Normal rate and regular rhythm.   Pulmonary/Chest: Effort normal and breath sounds normal. No respiratory distress.  Abdominal: Soft. Bowel sounds are normal. There is no tenderness.  Musculoskeletal: He exhibits no edema or tenderness.  Lymphadenopathy:    He has no cervical adenopathy.  Skin: No rash noted. No erythema.  Psychiatric: He has a normal mood and  affect. His behavior is normal.    BP 122/80 mmHg  Pulse 69  Temp(Src) 97.9 F (36.6 C) (Oral)  Resp 18  Ht  (1.803 m)  Wt 155 lb 8 oz (70.534 kg)  BMI 21.70 kg/m2  SpO2 98% Wt Readings from Last 3 Encounters:  01/07/15 155 lb 8 oz (70.534 kg)  07/03/14 161 lb (73.029 kg)  07/11/13 166 lb 4 oz (75.411 kg)     Lab Results  Component Value Date   WBC 4.0 06/27/2014   HGB 14.5 06/27/2014   HCT 42.1 06/27/2014   PLT 225.0 06/27/2014   GLUCOSE 95 06/27/2014   CHOL 218* 06/27/2014   TRIG 94.0 06/27/2014   HDL 65.00 06/27/2014   LDLDIRECT 144.2 08/22/2012   LDLCALC 134* 06/27/2014   ALT 19  06/27/2014   AST 20 06/27/2014   NA 136 07/03/2014   K 4.5 06/27/2014   CL 98 06/27/2014   CREATININE 0.86 06/27/2014   BUN 13 06/27/2014   CO2 28 06/27/2014   TSH 1.41 06/27/2014   PSA 0.50 07/03/2014       Assessment & Plan:   Problem List Items Addressed This Visit    Hypercholesterolemia    Low cholesterol diet and exercise.  Follow lipid panel.       Relevant Orders   Lipid panel   Hepatic function panel   Hypertension - Primary    Blood pressure under good control.  Continue same medication regimen.  Follow pressures.  Follow metabolic panel.        Relevant Orders   Basic metabolic panel   Leukopenia    06/27/14 wbc count wnl.  Follow.        Relevant Orders   CBC with Differential/Platelet   Vitamin D deficiency    Continue vitamin D supplements.  Follow vitamin D level.           Dale Warren, MD

## 2015-01-07 NOTE — Progress Notes (Signed)
Pre-visit discussion using our clinic review tool. No additional management support is needed unless otherwise documented below in the visit note.  

## 2015-01-12 ENCOUNTER — Encounter: Payer: Self-pay | Admitting: Internal Medicine

## 2015-01-12 NOTE — Assessment & Plan Note (Signed)
Blood pressure under good control.  Continue same medication regimen.  Follow pressures.  Follow metabolic panel.   

## 2015-01-12 NOTE — Assessment & Plan Note (Signed)
Low cholesterol diet and exercise.  Follow lipid panel.   

## 2015-01-12 NOTE — Assessment & Plan Note (Signed)
Continue vitamin D supplements.  Follow vitamin D level.   

## 2015-01-12 NOTE — Assessment & Plan Note (Signed)
06/27/14 wbc count wnl.  Follow.

## 2015-01-14 ENCOUNTER — Other Ambulatory Visit (INDEPENDENT_AMBULATORY_CARE_PROVIDER_SITE_OTHER): Payer: BLUE CROSS/BLUE SHIELD

## 2015-01-14 DIAGNOSIS — E7800 Pure hypercholesterolemia, unspecified: Secondary | ICD-10-CM

## 2015-01-14 DIAGNOSIS — D72819 Decreased white blood cell count, unspecified: Secondary | ICD-10-CM

## 2015-01-14 DIAGNOSIS — I10 Essential (primary) hypertension: Secondary | ICD-10-CM

## 2015-01-14 LAB — CBC WITH DIFFERENTIAL/PLATELET
BASOS ABS: 0 10*3/uL (ref 0.0–0.1)
BASOS PCT: 0.7 % (ref 0.0–3.0)
Eosinophils Absolute: 0 10*3/uL (ref 0.0–0.7)
Eosinophils Relative: 0.9 % (ref 0.0–5.0)
HEMATOCRIT: 43.1 % (ref 39.0–52.0)
Hemoglobin: 14.7 g/dL (ref 13.0–17.0)
LYMPHS ABS: 1.6 10*3/uL (ref 0.7–4.0)
Lymphocytes Relative: 41.1 % (ref 12.0–46.0)
MCHC: 34.1 g/dL (ref 30.0–36.0)
MCV: 90.1 fl (ref 78.0–100.0)
MONOS PCT: 9.3 % (ref 3.0–12.0)
Monocytes Absolute: 0.4 10*3/uL (ref 0.1–1.0)
NEUTROS ABS: 1.8 10*3/uL (ref 1.4–7.7)
NEUTROS PCT: 48 % (ref 43.0–77.0)
PLATELETS: 246 10*3/uL (ref 150.0–400.0)
RBC: 4.78 Mil/uL (ref 4.22–5.81)
RDW: 12.2 % (ref 11.5–15.5)
WBC: 3.8 10*3/uL — ABNORMAL LOW (ref 4.0–10.5)

## 2015-01-14 LAB — HEPATIC FUNCTION PANEL
ALBUMIN: 4.5 g/dL (ref 3.5–5.2)
ALK PHOS: 62 U/L (ref 39–117)
ALT: 16 U/L (ref 0–53)
AST: 19 U/L (ref 0–37)
BILIRUBIN DIRECT: 0.2 mg/dL (ref 0.0–0.3)
BILIRUBIN TOTAL: 0.6 mg/dL (ref 0.2–1.2)
Total Protein: 7.2 g/dL (ref 6.0–8.3)

## 2015-01-14 LAB — BASIC METABOLIC PANEL
BUN: 12 mg/dL (ref 6–23)
CALCIUM: 9.5 mg/dL (ref 8.4–10.5)
CHLORIDE: 101 meq/L (ref 96–112)
CO2: 28 meq/L (ref 19–32)
CREATININE: 0.92 mg/dL (ref 0.40–1.50)
GFR: 87.94 mL/min (ref >60.00)
GLUCOSE: 95 mg/dL (ref 70–99)
Potassium: 4.4 mEq/L (ref 3.5–5.1)
Sodium: 136 mEq/L (ref 135–145)

## 2015-01-14 LAB — LIPID PANEL
CHOL/HDL RATIO: 3
Cholesterol: 211 mg/dL — ABNORMAL HIGH (ref 0–200)
HDL: 76 mg/dL (ref >39.00)
LDL Cholesterol: 121 mg/dL — ABNORMAL HIGH (ref 0–99)
NONHDL: 134.81
Triglycerides: 71 mg/dL (ref 0.0–149.0)
VLDL: 14.2 mg/dL (ref 0.0–40.0)

## 2015-01-15 ENCOUNTER — Encounter: Payer: Self-pay | Admitting: Internal Medicine

## 2015-07-08 ENCOUNTER — Encounter: Payer: Self-pay | Admitting: Internal Medicine

## 2015-07-08 ENCOUNTER — Ambulatory Visit (INDEPENDENT_AMBULATORY_CARE_PROVIDER_SITE_OTHER): Payer: BLUE CROSS/BLUE SHIELD | Admitting: Internal Medicine

## 2015-07-08 VITALS — BP 128/80 | HR 78 | Temp 98.9°F | Resp 16 | Ht 71.0 in | Wt 156.0 lb

## 2015-07-08 DIAGNOSIS — I10 Essential (primary) hypertension: Secondary | ICD-10-CM | POA: Diagnosis not present

## 2015-07-08 DIAGNOSIS — D72819 Decreased white blood cell count, unspecified: Secondary | ICD-10-CM | POA: Diagnosis not present

## 2015-07-08 DIAGNOSIS — E7800 Pure hypercholesterolemia, unspecified: Secondary | ICD-10-CM

## 2015-07-08 DIAGNOSIS — Z125 Encounter for screening for malignant neoplasm of prostate: Secondary | ICD-10-CM

## 2015-07-08 DIAGNOSIS — R0789 Other chest pain: Secondary | ICD-10-CM

## 2015-07-08 DIAGNOSIS — Z0000 Encounter for general adult medical examination without abnormal findings: Secondary | ICD-10-CM

## 2015-07-08 DIAGNOSIS — E559 Vitamin D deficiency, unspecified: Secondary | ICD-10-CM

## 2015-07-08 NOTE — Progress Notes (Signed)
Patient ID: Scott Cohen, male   DOB: October 29, 1950, 65 y.o.   MRN: [id number]   Subjective:    Patient ID: Scott Cohen, male    DOB: Dec 29, 1950, 65 y.o.   MRN: [id number]  HPI  Patient here for a physical exam.   He reports he has been doing well.  Working.  Not exercising regularly.  No chest pain or tightness with increased activity or exertion.  No sob. Reports some increased left side pain - flares intermittently. Notices when lying down on left side.  May occur one time per week.  More tender if presses on the area.  No pain with deep breathing.  No sob.  No known injury or trauma.  Has noticed some left shoulder pain after working - at times.  No abdominal pain or cramping.  Bowels stable.     Past Medical History  Diagnosis Date  . Hypercholesterolemia   . Hypertension   . Leukopenia     benign cyclical   Past Surgical History  Procedure Laterality Date  . Wisdom tooth extraction     Family History  Problem Relation Age of Onset  . Heart disease Mother     pacemaker  . Heart disease Father     myocardial infarction  . Hypertension Father   . Diabetes Father   . CVA Father   . Diabetes Mother   . Colon cancer Neg Hx   . Prostate cancer Neg Hx    Social History   Social History  . Marital Status: Married    Spouse Name: N/A  . Number of Children: N/A  . Years of Education: N/A   Occupational History  .     Social History Main Topics  . Smoking status: Never Smoker   . Smokeless tobacco: Never Used  . Alcohol Use: 0.0 oz/week    0 Standard drinks or equivalent per week     Comment: 2-3 beers daily  . Drug Use: No  . Sexual Activity: Not Asked   Other Topics Concern  . None   Social History Narrative    Outpatient Encounter Prescriptions as of 07/08/2015  Medication Sig  . aspirin EC 81 MG tablet Take 81 mg by mouth daily.  . cholecalciferol (VITAMIN D) 1000 units tablet Take 1,000 Units by mouth daily.  Marland Kitchen losartan (COZAAR) 50 MG tablet TAKE ONE TABLET BY  MOUTH ONCE DAILY   No facility-administered encounter medications on file as of 07/08/2015.    Review of Systems  Constitutional: Negative for appetite change and unexpected weight change.  HENT: Negative for congestion and sinus pressure.   Eyes: Negative for pain and visual disturbance.  Respiratory: Negative for cough, chest tightness and shortness of breath.   Cardiovascular: Positive for chest pain (chest wall pain as outlined. ). Negative for palpitations and leg swelling.  Gastrointestinal: Negative for nausea, vomiting, abdominal pain and diarrhea.  Genitourinary: Negative for dysuria and difficulty urinating.  Musculoskeletal: Negative for back pain and joint swelling.  Skin: Negative for color change and rash.  Neurological: Negative for dizziness, light-headedness and headaches.  Hematological: Negative for adenopathy. Does not bruise/bleed easily.  Psychiatric/Behavioral: Negative for dysphoric mood and agitation.       Objective:    Physical Exam  Constitutional: He is oriented to person, place, and time. He appears well-developed and well-nourished. No distress.  HENT:  Head: Normocephalic and atraumatic.  Nose: Nose normal.  Mouth/Throat: Oropharynx is clear and moist. No oropharyngeal exudate.  Eyes: Conjunctivae are  normal. Right eye exhibits no discharge. Left eye exhibits no discharge.  Neck: Neck supple. No thyromegaly present.  Cardiovascular: Normal rate and regular rhythm.   Pulmonary/Chest: Breath sounds normal. No respiratory distress. He has no wheezes.  Abdominal: Soft. Bowel sounds are normal. There is no tenderness.  Genitourinary:  Rectal exam - no palpable prostate nodule.  Heme negative.   Musculoskeletal: He exhibits no edema or tenderness.  Lymphadenopathy:    He has no cervical adenopathy.  Neurological: He is alert and oriented to person, place, and time.  Skin: Skin is warm and dry. No rash noted. No erythema.  Psychiatric: He has a normal  mood and affect. His behavior is normal.    BP 128/80 mmHg  Pulse 78  Temp(Src) 98.9 F (37.2 C) (Oral)  Resp 16  Ht 5\' 11"  (1.803 m)  Wt 156 lb (70.761 kg)  BMI 21.77 kg/m2  SpO2 98% Wt Readings from Last 3 Encounters:  07/09/15 156 lb (70.761 kg)  07/08/15 156 lb (70.761 kg)  01/07/15 155 lb 8 oz (70.534 kg)     Lab Results  Component Value Date   WBC 4.2 07/09/2015   HGB 14.1 07/09/2015   HCT 41.1 07/09/2015   PLT 195 07/09/2015   GLUCOSE 101* 07/09/2015   CHOL 211* 01/14/2015   TRIG 71.0 01/14/2015   HDL 76.00 01/14/2015   LDLDIRECT 144.2 08/22/2012   LDLCALC 121* 01/14/2015   ALT 22 07/09/2015   AST 26 07/09/2015   NA 134* 07/09/2015   K 4.6 07/09/2015   CL 102 07/09/2015   CREATININE 0.88 07/09/2015   BUN 11 07/09/2015   CO2 24 07/09/2015   TSH 1.41 06/27/2014   PSA 0.50 07/03/2014       Assessment & Plan:   Problem List Items Addressed This Visit    Health care maintenance    Physical today 07/08/15.  Check psa with next labs.  Colonoscopy 02/13/07 - normal.       Hypercholesterolemia    Low cholesterol diet and exercise.  Follow lipid panel.       Relevant Orders   Lipid panel   Hypertension    Blood pressure under good control.  Continue same medication regimen.  Follow pressures.  Follow metabolic panel.        Relevant Orders   TSH   Basic metabolic panel   Left-sided chest wall pain    Occurs intermittently when lying on left side.  Is aggravated by palpation on the area.  No known injury or trauma.  Discussed further w/up.  Avoid pressure on that side.  Tylenol as directed.  Hold on xray or scanning.  Follow.  Call with update.        Leukopenia    Recheck cbc to confirm wnl.       Vitamin D deficiency    Continue vitamin D supplements.  Check vitamin D level with next labs.       Relevant Orders   VITAMIN D 25 Hydroxy (Vit-D Deficiency, Fractures)    Other Visit Diagnoses    Routine general medical examination at a health care  facility    -  Primary    Prostate cancer screening        Relevant Orders    PSA        Scott Cohen, Scott Tosh, MD

## 2015-07-09 ENCOUNTER — Encounter: Payer: Self-pay | Admitting: Internal Medicine

## 2015-07-09 ENCOUNTER — Emergency Department
Admission: EM | Admit: 2015-07-09 | Discharge: 2015-07-09 | Disposition: A | Discharge status: Home / Self Care | Payer: BLUE CROSS/BLUE SHIELD | Attending: Emergency Medicine | Admitting: Emergency Medicine

## 2015-07-09 ENCOUNTER — Telehealth: Payer: Self-pay | Admitting: Cardiology

## 2015-07-09 ENCOUNTER — Emergency Department: Payer: BLUE CROSS/BLUE SHIELD

## 2015-07-09 DIAGNOSIS — Z79899 Other long term (current) drug therapy: Secondary | ICD-10-CM | POA: Diagnosis not present

## 2015-07-09 DIAGNOSIS — R55 Syncope and collapse: Secondary | ICD-10-CM | POA: Diagnosis not present

## 2015-07-09 DIAGNOSIS — Z7982 Long term (current) use of aspirin: Secondary | ICD-10-CM | POA: Insufficient documentation

## 2015-07-09 DIAGNOSIS — I10 Essential (primary) hypertension: Secondary | ICD-10-CM | POA: Diagnosis not present

## 2015-07-09 LAB — URINE DRUG SCREEN, QUALITATIVE (ARMC ONLY)
AMPHETAMINES, UR SCREEN: NOT DETECTED
BENZODIAZEPINE, UR SCRN: NOT DETECTED
Barbiturates, Ur Screen: NOT DETECTED
Cannabinoid 50 Ng, Ur ~~LOC~~: NOT DETECTED
Cocaine Metabolite,Ur ~~LOC~~: NOT DETECTED
MDMA (ECSTASY) UR SCREEN: NOT DETECTED
METHADONE SCREEN, URINE: NOT DETECTED
Opiate, Ur Screen: NOT DETECTED
Phencyclidine (PCP) Ur S: NOT DETECTED
TRICYCLIC, UR SCREEN: NOT DETECTED

## 2015-07-09 LAB — CBC WITH DIFFERENTIAL/PLATELET
Basophils Absolute: 0.1 10*3/uL (ref 0–0.1)
Basophils Relative: 1 %
EOS ABS: 0 10*3/uL (ref 0–0.7)
Eosinophils Relative: 1 %
HCT: 41.1 % (ref 40.0–52.0)
HEMOGLOBIN: 14.1 g/dL (ref 13.0–18.0)
LYMPHS ABS: 1.1 10*3/uL (ref 1.0–3.6)
LYMPHS PCT: 26 %
MCH: 30.2 pg (ref 26.0–34.0)
MCHC: 34.2 g/dL (ref 32.0–36.0)
MCV: 88.4 fL (ref 80.0–100.0)
Monocytes Absolute: 0.4 10*3/uL (ref 0.2–1.0)
Monocytes Relative: 9 %
NEUTROS PCT: 63 %
Neutro Abs: 2.7 10*3/uL (ref 1.4–6.5)
Platelets: 195 10*3/uL (ref 150–440)
RBC: 4.65 MIL/uL (ref 4.40–5.90)
RDW: 12.3 % (ref 11.5–14.5)
WBC: 4.2 10*3/uL (ref 3.8–10.6)

## 2015-07-09 LAB — COMPREHENSIVE METABOLIC PANEL
ALK PHOS: 56 U/L (ref 38–126)
ALT: 22 U/L (ref 17–63)
AST: 26 U/L (ref 15–41)
Albumin: 4.2 g/dL (ref 3.5–5.0)
Anion gap: 8 (ref 5–15)
BUN: 11 mg/dL (ref 6–20)
CALCIUM: 9 mg/dL (ref 8.9–10.3)
CO2: 24 mmol/L (ref 22–32)
CREATININE: 0.88 mg/dL (ref 0.61–1.24)
Chloride: 102 mmol/L (ref 101–111)
GFR calc non Af Amer: >60 mL/min (ref >60)
GLUCOSE: 101 mg/dL — AB (ref 65–99)
Potassium: 4.6 mmol/L (ref 3.5–5.1)
SODIUM: 134 mmol/L — AB (ref 135–145)
Total Bilirubin: 0.7 mg/dL (ref 0.3–1.2)
Total Protein: 7 g/dL (ref 6.5–8.1)

## 2015-07-09 LAB — TROPONIN I: Troponin I: <0.03 ng/mL (ref <0.031)

## 2015-07-09 LAB — URINALYSIS COMPLETE WITH MICROSCOPIC (ARMC ONLY)
BACTERIA UA: NONE SEEN
Bilirubin Urine: NEGATIVE
GLUCOSE, UA: NEGATIVE mg/dL
Ketones, ur: NEGATIVE mg/dL
Leukocytes, UA: NEGATIVE
Nitrite: NEGATIVE
Protein, ur: NEGATIVE mg/dL
SQUAMOUS EPITHELIAL / LPF: NONE SEEN
Specific Gravity, Urine: 1.005 (ref 1.005–1.030)
WBC UA: NONE SEEN WBC/hpf (ref 0–5)
pH: 6 (ref 5.0–8.0)

## 2015-07-09 LAB — ETHANOL

## 2015-07-09 NOTE — Telephone Encounter (Signed)
Lmov for patient to make an ED fu apportionment seen for Near Syncope

## 2015-07-09 NOTE — ED Provider Notes (Signed)
[facility] Emergency Department Provider Note        Time seen: ----------------------------------------- 11:37 AM on 07/09/2015 -----------------------------------------    I have reviewed the triage vital signs and the nursing notes.   HISTORY  Chief Complaint No chief complaint on file.    HPI Scott Cohen is a 65 y.o. male who presents to ER after having had a syncopal event. Patient was talking on the phone sitting down and describes sensation like a wave was washed over him. Patient then realized he had dropped a phone and realize he must of blacked out temporarily. He denies any recent illness, states he ate breakfast like normal. He does admit to taking alcohol daily. Denies history of this before.   Past Medical History  Diagnosis Date  . Hypercholesterolemia   . Hypertension   . Leukopenia     benign cyclical    Patient Active Problem List   Diagnosis Date Noted  . Health care maintenance 07/13/2014  . Vitamin D deficiency 03/12/2013  . Hypertension 02/09/2012  . Hypercholesterolemia 02/09/2012  . Leukopenia 02/09/2012    Past Surgical History  Procedure Laterality Date  . Wisdom tooth extraction      Allergies Review of patient's allergies indicates no known allergies.  Social History Social History  Substance Use Topics  . Smoking status: Never Smoker   . Smokeless tobacco: Never Used  . Alcohol Use: 0.0 oz/week    0 Standard drinks or equivalent per week     Comment: occasional    Review of Systems Constitutional: Negative for fever. Eyes: Negative for visual changes. ENT: Negative for sore throat. Cardiovascular: Negative for chest pain. Respiratory: Negative for shortness of breath. Gastrointestinal: Negative for abdominal pain, vomiting and diarrhea. Genitourinary: Negative for dysuria. Musculoskeletal: Negative for back pain. Skin: Negative for rash. Neurological: Negative for headaches, focal weakness or  numbness.  10-point ROS otherwise negative.  ____________________________________________   PHYSICAL EXAM:  VITAL SIGNS: ED Triage Vitals  Enc Vitals Group     BP --      Pulse --      Resp --      Temp --      Temp src --      SpO2 --      Weight --      Height --      Head Cir --      Peak Flow --      Pain Score --      Pain Loc --      Pain Edu? --      Excl. in GC? --     Constitutional: Alert and oriented. Well appearing and in no distress. Eyes: Conjunctivae are normal. PERRL. Normal extraocular movements. ENT   Head: Normocephalic and atraumatic.   Nose: No congestion/rhinnorhea.   Mouth/Throat: Mucous membranes are moist.   Neck: No stridor. Cardiovascular: Normal rate, regular rhythm. No murmurs, rubs, or gallops. Respiratory: Normal respiratory effort without tachypnea nor retractions. Breath sounds are clear and equal bilaterally. No wheezes/rales/rhonchi. Gastrointestinal: Soft and nontender. Normal bowel sounds Musculoskeletal: Nontender with normal range of motion in all extremities. No lower extremity tenderness nor edema. Neurologic:  Normal speech and language. No gross focal neurologic deficits are appreciated.  Skin:  Skin is warm, dry and intact. No rash noted. Psychiatric: Mood and affect are normal. Speech and behavior are normal.  ____________________________________________  EKG: Interpreted by me.Sinus rhythm with a rate of 69 bpm, normal axis, normal intervals, no evidence  of hypertrophy or acute infarction  ____________________________________________  ED COURSE:  Pertinent labs & imaging results that were available during my care of the patient were reviewed by me and considered in my medical decision making (see chart for details). Patient was syncope of unclear etiology. We will check cardiac labs, placed on the monitor and observe in the ER. ____________________________________________    LABS (pertinent  positives/negatives)  Labs Reviewed  COMPREHENSIVE METABOLIC PANEL - Abnormal; Notable for the following:    Sodium 134 (*)    Glucose, Bld 101 (*)    All other components within normal limits  URINALYSIS COMPLETEWITH MICROSCOPIC ([facility] ONLY) - Abnormal; Notable for the following:    Color, Urine COLORLESS (*)    APPearance CLEAR (*)    Hgb urine dipstick 1+ (*)    All other components within normal limits  CBC WITH DIFFERENTIAL/PLATELET  TROPONIN I  ETHANOL  URINE DRUG SCREEN, QUALITATIVE ([facility] ONLY)    RADIOLOGY  Chest x-ray  IMPRESSION: No edema or consolidation. ____________________________________________  FINAL ASSESSMENT AND PLAN  Syncope  Plan: Patient with labs and imaging as dictated above. Patient presents with very brief syncopal event of uncertain etiology. His lab work, chest x-ray, EKG and telemetry while he's been in the ER has been normal. He will be referred to cardiology for outpatient follow-up.   [name], [name], MD   Note: This dictation was prepared with Dragon dictation. Any transcriptional errors that result from this process are unintentional   [name], MD 07/09/15 [phone]

## 2015-07-09 NOTE — Discharge Instructions (Signed)

## 2015-07-09 NOTE — ED Notes (Signed)
Patient at work. Felt "funny" and dropped phone while talking. Did not pass out but had memory loss at time.

## 2015-07-10 DIAGNOSIS — R0789 Other chest pain: Secondary | ICD-10-CM | POA: Diagnosis not present

## 2015-07-10 DIAGNOSIS — E7800 Pure hypercholesterolemia, unspecified: Secondary | ICD-10-CM | POA: Diagnosis not present

## 2015-07-10 DIAGNOSIS — I10 Essential (primary) hypertension: Secondary | ICD-10-CM | POA: Diagnosis not present

## 2015-07-10 DIAGNOSIS — Z87898 Personal history of other specified conditions: Secondary | ICD-10-CM | POA: Diagnosis not present

## 2015-07-10 DIAGNOSIS — R55 Syncope and collapse: Secondary | ICD-10-CM | POA: Diagnosis not present

## 2015-07-10 NOTE — Telephone Encounter (Signed)
Order placed for cardiology referral.  Pt notified via my chart.

## 2015-07-11 ENCOUNTER — Encounter: Payer: Self-pay | Admitting: Internal Medicine

## 2015-07-11 DIAGNOSIS — R0789 Other chest pain: Secondary | ICD-10-CM | POA: Insufficient documentation

## 2015-07-11 NOTE — Assessment & Plan Note (Signed)
Continue vitamin D supplements.  Check vitamin D level with next labs.  

## 2015-07-11 NOTE — Assessment & Plan Note (Signed)
Recheck cbc to confirm wnl.  

## 2015-07-11 NOTE — Assessment & Plan Note (Signed)
Occurs intermittently when lying on left side.  Is aggravated by palpation on the area.  No known injury or trauma.  Discussed further w/up.  Avoid pressure on that side.  Tylenol as directed.  Hold on xray or scanning.  Follow.  Call with update.

## 2015-07-11 NOTE — Assessment & Plan Note (Signed)
Blood pressure under good control.  Continue same medication regimen.  Follow pressures.  Follow metabolic panel.   

## 2015-07-11 NOTE — Assessment & Plan Note (Signed)
Physical today 07/08/15.  Check psa with next labs.  Colonoscopy 02/13/07 - normal.

## 2015-07-11 NOTE — Assessment & Plan Note (Signed)
Low cholesterol diet and exercise.  Follow lipid panel.   

## 2015-07-14 DIAGNOSIS — Z87898 Personal history of other specified conditions: Secondary | ICD-10-CM | POA: Diagnosis not present

## 2015-07-15 ENCOUNTER — Ambulatory Visit: Payer: BLUE CROSS/BLUE SHIELD | Admitting: Cardiology

## 2015-07-17 ENCOUNTER — Other Ambulatory Visit (INDEPENDENT_AMBULATORY_CARE_PROVIDER_SITE_OTHER): Payer: BLUE CROSS/BLUE SHIELD

## 2015-07-17 DIAGNOSIS — E559 Vitamin D deficiency, unspecified: Secondary | ICD-10-CM | POA: Diagnosis not present

## 2015-07-17 DIAGNOSIS — I10 Essential (primary) hypertension: Secondary | ICD-10-CM

## 2015-07-17 DIAGNOSIS — E7800 Pure hypercholesterolemia, unspecified: Secondary | ICD-10-CM | POA: Diagnosis not present

## 2015-07-17 DIAGNOSIS — Z125 Encounter for screening for malignant neoplasm of prostate: Secondary | ICD-10-CM | POA: Diagnosis not present

## 2015-07-17 LAB — LIPID PANEL
CHOL/HDL RATIO: 3
CHOLESTEROL: 208 mg/dL — AB (ref 0–200)
HDL: 64 mg/dL (ref >39.00)
LDL CALC: 112 mg/dL — AB (ref 0–99)
NonHDL: 144.42
TRIGLYCERIDES: 164 mg/dL — AB (ref 0.0–149.0)
VLDL: 32.8 mg/dL (ref 0.0–40.0)

## 2015-07-17 LAB — BASIC METABOLIC PANEL
BUN: 13 mg/dL (ref 6–23)
CALCIUM: 9.2 mg/dL (ref 8.4–10.5)
CHLORIDE: 99 meq/L (ref 96–112)
CO2: 25 meq/L (ref 19–32)
CREATININE: 0.98 mg/dL (ref 0.40–1.50)
GFR: 81.63 mL/min (ref >60.00)
Glucose, Bld: 89 mg/dL (ref 70–99)
Potassium: 4.5 mEq/L (ref 3.5–5.1)
Sodium: 134 mEq/L — ABNORMAL LOW (ref 135–145)

## 2015-07-17 LAB — TSH: TSH: 1.23 u[IU]/mL (ref 0.35–4.50)

## 2015-07-17 LAB — VITAMIN D 25 HYDROXY (VIT D DEFICIENCY, FRACTURES): VITD: 37.3 ng/mL (ref 30.00–100.00)

## 2015-07-17 LAB — PSA: PSA: 1.02 ng/mL (ref 0.10–4.00)

## 2015-07-21 ENCOUNTER — Other Ambulatory Visit: Payer: Self-pay | Admitting: Internal Medicine

## 2015-07-21 ENCOUNTER — Encounter: Payer: Self-pay | Admitting: *Deleted

## 2015-07-21 DIAGNOSIS — E871 Hypo-osmolality and hyponatremia: Secondary | ICD-10-CM

## 2015-07-21 NOTE — Progress Notes (Signed)
Order placed for f/u sodium.  ?

## 2015-08-19 ENCOUNTER — Other Ambulatory Visit (INDEPENDENT_AMBULATORY_CARE_PROVIDER_SITE_OTHER): Payer: BLUE CROSS/BLUE SHIELD

## 2015-08-19 DIAGNOSIS — E871 Hypo-osmolality and hyponatremia: Secondary | ICD-10-CM | POA: Diagnosis not present

## 2015-08-19 LAB — SODIUM: Sodium: 133 mEq/L — ABNORMAL LOW (ref 135–145)

## 2015-08-20 ENCOUNTER — Other Ambulatory Visit: Payer: Self-pay | Admitting: Internal Medicine

## 2015-08-20 ENCOUNTER — Encounter: Payer: Self-pay | Admitting: *Deleted

## 2015-08-20 DIAGNOSIS — E871 Hypo-osmolality and hyponatremia: Secondary | ICD-10-CM

## 2015-08-20 NOTE — Progress Notes (Signed)
Order placed for f/u sodium lab.  

## 2015-10-06 ENCOUNTER — Encounter: Payer: Self-pay | Admitting: Internal Medicine

## 2015-10-06 ENCOUNTER — Other Ambulatory Visit (INDEPENDENT_AMBULATORY_CARE_PROVIDER_SITE_OTHER): Payer: BLUE CROSS/BLUE SHIELD

## 2015-10-06 DIAGNOSIS — E871 Hypo-osmolality and hyponatremia: Secondary | ICD-10-CM

## 2015-10-06 LAB — SODIUM: SODIUM: 135 meq/L (ref 135–145)

## 2015-10-17 ENCOUNTER — Other Ambulatory Visit: Payer: Self-pay | Admitting: Internal Medicine

## 2015-11-09 ENCOUNTER — Ambulatory Visit (INDEPENDENT_AMBULATORY_CARE_PROVIDER_SITE_OTHER): Payer: BLUE CROSS/BLUE SHIELD | Admitting: Internal Medicine

## 2015-11-09 ENCOUNTER — Encounter: Payer: Self-pay | Admitting: Internal Medicine

## 2015-11-09 VITALS — BP 138/88 | HR 62 | Temp 97.7°F | Ht 72.0 in | Wt 157.4 lb

## 2015-11-09 DIAGNOSIS — I10 Essential (primary) hypertension: Secondary | ICD-10-CM

## 2015-11-09 DIAGNOSIS — D72819 Decreased white blood cell count, unspecified: Secondary | ICD-10-CM | POA: Diagnosis not present

## 2015-11-09 DIAGNOSIS — E559 Vitamin D deficiency, unspecified: Secondary | ICD-10-CM | POA: Diagnosis not present

## 2015-11-09 DIAGNOSIS — E7800 Pure hypercholesterolemia, unspecified: Secondary | ICD-10-CM

## 2015-11-09 DIAGNOSIS — R972 Elevated prostate specific antigen [PSA]: Secondary | ICD-10-CM

## 2015-11-09 DIAGNOSIS — G479 Sleep disorder, unspecified: Secondary | ICD-10-CM

## 2015-11-09 DIAGNOSIS — R0981 Nasal congestion: Secondary | ICD-10-CM | POA: Diagnosis not present

## 2015-11-09 NOTE — Patient Instructions (Signed)
Saline nasal spray - flush nose at least 2-3x/day  flonase nasal spray - 2 sprays each nostril one time per day.  Do this in the evening.   

## 2015-11-09 NOTE — Progress Notes (Signed)
Patient ID: MOHAMMEDALI BEDOY, male   DOB: Jun 13, 1950, 65 y.o.   MRN: [id number]   Subjective:    Patient ID: [name], male    DOB: 07-19-1950, 65 y.o.   MRN: [id number]  HPI  Patient here for a scheduled follow up.  States he is doing relatively well.  Increased stress.  Overall handling things relatively well.  States this has been a stressful morning.  No chest pain.  Tries to stay active.  Not exercising as much recently.  No sob.  No further syncope or near syncopal episodes.  Saw cardiology.  Had holter monitor - SR with occasional PVCs.  States stress test was ok.  Eating and drinking.  Trying to stay hydrated.  No nausea or vomiting.  Bowels stable.  Has been having trouble staying asleep.  This has become an every night occurrence.     Past Medical History:  Diagnosis Date  . Hypercholesterolemia   . Hypertension   . Leukopenia    benign cyclical   Past Surgical History:  Procedure Laterality Date  . WISDOM TOOTH EXTRACTION     Family History  Problem Relation Age of Onset  . Heart disease Mother     pacemaker  . Diabetes Mother   . Heart disease Father     myocardial infarction  . Hypertension Father   . Diabetes Father   . CVA Father   . Colon cancer Neg Hx   . Prostate cancer Neg Hx    Social History   Social History  . Marital status: Married    Spouse name: N/A  . Number of children: N/A  . Years of education: N/A   Occupational History  .  [employer]   Social History Main Topics  . Smoking status: Never Smoker  . Smokeless tobacco: Never Used  . Alcohol use 0.0 oz/week     Comment: 2-3 beers daily  . Drug use: No  . Sexual activity: Not Asked   Other Topics Concern  . None   Social History Narrative  . None    Outpatient Encounter Prescriptions as of 11/09/2015  Medication Sig  . aspirin EC 81 MG tablet Take 81 mg by mouth daily.  . cholecalciferol (VITAMIN D) 1000 units tablet Take 1,000 Units by mouth daily.  Marland Kitchen losartan (COZAAR) 50  MG tablet TAKE ONE TABLET BY MOUTH ONCE DAILY   No facility-administered encounter medications on file as of 11/09/2015.     Review of Systems  Constitutional: Negative for appetite change and unexpected weight change.       No significant increased daytime somnolence.    HENT: Negative for sinus pressure.        Minimal nasal congestion and drainage.    Respiratory: Negative for cough, chest tightness and shortness of breath.   Cardiovascular: Negative for chest pain, palpitations and leg swelling.  Gastrointestinal: Negative for abdominal pain, diarrhea, nausea and vomiting.  Genitourinary: Negative for difficulty urinating and dysuria.  Musculoskeletal: Negative for back pain and joint swelling.  Skin: Negative for color change and rash.  Neurological: Negative for dizziness, light-headedness and headaches.  Psychiatric/Behavioral: Positive for sleep disturbance. Negative for agitation and dysphoric mood.       Objective:     Blood pressure rechecked by me:  142/84  Physical Exam  Constitutional: He appears well-developed and well-nourished. No distress.  HENT:  Nose: Nose normal.  Mouth/Throat: Oropharynx is clear and moist.  Neck: Neck supple. No thyromegaly present.  Cardiovascular: Normal  rate and regular rhythm.   Pulmonary/Chest: Effort normal and breath sounds normal. No respiratory distress.  Abdominal: Soft. Bowel sounds are normal. There is no tenderness.  Musculoskeletal: He exhibits no edema or tenderness.  Lymphadenopathy:    He has no cervical adenopathy.  Skin: No rash noted. No erythema.  Psychiatric: He has a normal mood and affect. His behavior is normal.    BP 138/88   Pulse 62   Temp 97.7 F (36.5 C) (Oral)   Ht 6' (1.829 m)   Wt 157 lb 6.4 oz (71.4 kg)   SpO2 98%   BMI 21.35 kg/m  Wt Readings from Last 3 Encounters:  11/09/15 157 lb 6.4 oz (71.4 kg)  07/09/15 156 lb (70.8 kg)  07/08/15 156 lb (70.8 kg)     Lab Results  Component Value  Date   WBC 4.2 07/09/2015   HGB 14.1 07/09/2015   HCT 41.1 07/09/2015   PLT 195 07/09/2015   GLUCOSE 89 07/17/2015   CHOL 208 (H) 07/17/2015   TRIG 164.0 (H) 07/17/2015   HDL 64.00 07/17/2015   LDLDIRECT 144.2 08/22/2012   LDLCALC 112 (H) 07/17/2015   ALT 22 07/09/2015   AST 26 07/09/2015   NA 135 10/06/2015   K 4.5 07/17/2015   CL 99 07/17/2015   CREATININE 0.98 07/17/2015   BUN 13 07/17/2015   CO2 25 07/17/2015   TSH 1.23 07/17/2015   PSA 1.02 07/17/2015    Dg Chest 1 View  Result Date: 07/09/2015 CLINICAL DATA:  Syncope EXAM: CHEST 1 VIEW COMPARISON:  None. FINDINGS: There is no edema or consolidation. Heart size and pulmonary vascularity are normal. No adenopathy. No bone lesions. IMPRESSION: No edema or consolidation. Electronically Signed   By: Bretta BangWilliam  Woodruff III M.D.   On: 07/09/2015 12:26       Assessment & Plan:   Problem List Items Addressed This Visit    Hypercholesterolemia    Low cholesterol diet and exercise.  Follow lipid panel.       Relevant Orders   Lipid panel   Hepatic function panel   Hypertension    Blood pressure a little elevated today.  He is not sleeping as well.  Increased stress.  Discussed with him today.  Will try melatonin to help him sleep.  Have him spot check his pressure.  Follow. Get him back in soon to reassess.  Hold on making changes today.  If persistent elevation, will adjust medication.       Relevant Orders   Basic metabolic panel   Leukopenia    Follow cbc.       Relevant Orders   CBC with Differential/Platelet   Sleeping difficulty    Has been having trouble staying asleep.  Discussed treatment options.  Try melatonin.  Follow.        Vitamin D deficiency    Continue vitamin D supplements.  Follow vitamin D level.         Other Visit Diagnoses    Nasal congestion    -  Primary   saline nasal spray and flonase as directed.  follow.    PSA elevation       Relevant Orders   PSA       Dale DurhamSCOTT, Collene Massimino,  MD

## 2015-11-09 NOTE — Progress Notes (Signed)
Pre visit review using our clinic review tool, if applicable. No additional management support is needed unless otherwise documented below in the visit note. 

## 2015-11-10 ENCOUNTER — Encounter: Payer: Self-pay | Admitting: Internal Medicine

## 2015-11-10 DIAGNOSIS — G479 Sleep disorder, unspecified: Secondary | ICD-10-CM | POA: Insufficient documentation

## 2015-11-10 NOTE — Assessment & Plan Note (Signed)
Continue vitamin D supplements.  Follow vitamin D level.   

## 2015-11-10 NOTE — Assessment & Plan Note (Signed)
Follow cbc.  

## 2015-11-10 NOTE — Assessment & Plan Note (Signed)
Blood pressure a little elevated today.  He is not sleeping as well.  Increased stress.  Discussed with him today.  Will try melatonin to help him sleep.  Have him spot check his pressure.  Follow. Get him back in soon to reassess.  Hold on making changes today.  If persistent elevation, will adjust medication.

## 2015-11-10 NOTE — Assessment & Plan Note (Signed)
Has been having trouble staying asleep.  Discussed treatment options.  Try melatonin.  Follow.

## 2015-11-10 NOTE — Assessment & Plan Note (Signed)
Low cholesterol diet and exercise.  Follow lipid panel.   

## 2015-12-29 ENCOUNTER — Other Ambulatory Visit (INDEPENDENT_AMBULATORY_CARE_PROVIDER_SITE_OTHER): Payer: BLUE CROSS/BLUE SHIELD

## 2015-12-29 DIAGNOSIS — E7800 Pure hypercholesterolemia, unspecified: Secondary | ICD-10-CM | POA: Diagnosis not present

## 2015-12-29 DIAGNOSIS — D72819 Decreased white blood cell count, unspecified: Secondary | ICD-10-CM

## 2015-12-29 DIAGNOSIS — I10 Essential (primary) hypertension: Secondary | ICD-10-CM

## 2015-12-29 DIAGNOSIS — R972 Elevated prostate specific antigen [PSA]: Secondary | ICD-10-CM | POA: Diagnosis not present

## 2015-12-29 LAB — CBC WITH DIFFERENTIAL/PLATELET
BASOS ABS: 0 10*3/uL (ref 0.0–0.1)
Basophils Relative: 0.6 % (ref 0.0–3.0)
Eosinophils Absolute: 0 10*3/uL (ref 0.0–0.7)
Eosinophils Relative: 1 % (ref 0.0–5.0)
HEMATOCRIT: 43 % (ref 39.0–52.0)
HEMOGLOBIN: 14.9 g/dL (ref 13.0–17.0)
LYMPHS PCT: 36.6 % (ref 12.0–46.0)
Lymphs Abs: 1.7 10*3/uL (ref 0.7–4.0)
MCHC: 34.5 g/dL (ref 30.0–36.0)
MCV: 89.3 fl (ref 78.0–100.0)
MONOS PCT: 10.7 % (ref 3.0–12.0)
Monocytes Absolute: 0.5 10*3/uL (ref 0.1–1.0)
NEUTROS PCT: 51.1 % (ref 43.0–77.0)
Neutro Abs: 2.3 10*3/uL (ref 1.4–7.7)
Platelets: 234 10*3/uL (ref 150.0–400.0)
RBC: 4.82 Mil/uL (ref 4.22–5.81)
RDW: 12.3 % (ref 11.5–15.5)
WBC: 4.6 10*3/uL (ref 4.0–10.5)

## 2015-12-29 LAB — BASIC METABOLIC PANEL
BUN: 11 mg/dL (ref 6–23)
CHLORIDE: 100 meq/L (ref 96–112)
CO2: 29 meq/L (ref 19–32)
CREATININE: 0.9 mg/dL (ref 0.40–1.50)
Calcium: 9.4 mg/dL (ref 8.4–10.5)
GFR: 89.93 mL/min (ref >60.00)
Glucose, Bld: 96 mg/dL (ref 70–99)
Potassium: 4.6 mEq/L (ref 3.5–5.1)
Sodium: 136 mEq/L (ref 135–145)

## 2015-12-29 LAB — LIPID PANEL
CHOL/HDL RATIO: 3
Cholesterol: 219 mg/dL — ABNORMAL HIGH (ref 0–200)
HDL: 77.6 mg/dL (ref >39.00)
LDL CALC: 120 mg/dL — AB (ref 0–99)
NonHDL: 141.14
TRIGLYCERIDES: 108 mg/dL (ref 0.0–149.0)
VLDL: 21.6 mg/dL (ref 0.0–40.0)

## 2015-12-29 LAB — HEPATIC FUNCTION PANEL
ALBUMIN: 4.4 g/dL (ref 3.5–5.2)
ALT: 22 U/L (ref 0–53)
AST: 21 U/L (ref 0–37)
Alkaline Phosphatase: 58 U/L (ref 39–117)
BILIRUBIN DIRECT: 0.1 mg/dL (ref 0.0–0.3)
TOTAL PROTEIN: 7 g/dL (ref 6.0–8.3)
Total Bilirubin: 0.6 mg/dL (ref 0.2–1.2)

## 2015-12-29 LAB — PSA: PSA: 1.06 ng/mL (ref 0.10–4.00)

## 2015-12-30 ENCOUNTER — Telehealth: Payer: Self-pay

## 2015-12-30 ENCOUNTER — Other Ambulatory Visit: Payer: Self-pay | Admitting: Internal Medicine

## 2015-12-30 DIAGNOSIS — E7800 Pure hypercholesterolemia, unspecified: Secondary | ICD-10-CM

## 2015-12-30 MED ORDER — ROSUVASTATIN CALCIUM 10 MG PO TABS: 10.0000 mg | ORAL_TABLET | Freq: Every day | ORAL | 3 refills | Status: DC

## 2015-12-30 NOTE — Telephone Encounter (Signed)
Patient notified, rx sent to pharmacy and lab scheduled, please place lab order

## 2015-12-30 NOTE — Telephone Encounter (Signed)
Order placed for f/u liver panel.  

## 2015-12-30 NOTE — Telephone Encounter (Signed)
-----   Message from Dale Durhamharlene Scott, MD sent at 12/30/2015  5:59 AM EST ----- Please call and notify pt that his cholesterol has increased some from the previous check.  Given his history of elevated blood pressure, etc  - I recommend starting him on a cholesterol medication.  I would like to start crestor 10mg  q day.  Will need f/u liver panel checked 6 weeks after starting the medication.  PSA wnl.  Hgb, kidney function tests and liver function tests are wnl.

## 2015-12-30 NOTE — Progress Notes (Signed)
Order placed for f/u lab - liver panel.

## 2016-01-01 ENCOUNTER — Encounter: Payer: Self-pay | Admitting: Internal Medicine

## 2016-01-01 ENCOUNTER — Ambulatory Visit (INDEPENDENT_AMBULATORY_CARE_PROVIDER_SITE_OTHER): Payer: BLUE CROSS/BLUE SHIELD | Admitting: Internal Medicine

## 2016-01-01 DIAGNOSIS — E7800 Pure hypercholesterolemia, unspecified: Secondary | ICD-10-CM

## 2016-01-01 DIAGNOSIS — G479 Sleep disorder, unspecified: Secondary | ICD-10-CM | POA: Diagnosis not present

## 2016-01-01 DIAGNOSIS — I10 Essential (primary) hypertension: Secondary | ICD-10-CM

## 2016-01-01 NOTE — Progress Notes (Signed)
Pre visit review using our clinic review tool, if applicable. No additional management support is needed unless otherwise documented below in the visit note. 

## 2016-01-01 NOTE — Progress Notes (Signed)
Patient ID: Scott Cohen, male   DOB: 02/15/1950, 65 y.o.   MRN: [id number]   Subjective:    Patient ID: Scott Cohen, male    DOB: 02/15/1950, 65 y.o.   MRN: [id number]  HPI  Patient here for a scheduled follow up. He is doing well.  Has been having difficulty sleeping.  Tried melatonin.  Feel is caused him to have a headache.  Took on two separate occasions.  Headache occurred.  No other headache.  Eating and drinking well.  Tries to stay active.  No chest pain.  No sob.  No nausea or vomiting.  Bowels stable.  Blood pressure on outside checks averaging 111-133 systolic readings.  Discussed his sleeping issues.  We discussed the possibility of sleep apnea.  Discussed frequent awakenings, possible snoring, previous witnessed apneic episodes, etc.  He wants to hold on sleep study.  Follow.     Past Medical History:  Diagnosis Date  . Hypercholesterolemia   . Hypertension   . Leukopenia    benign cyclical   Past Surgical History:  Procedure Laterality Date  . WISDOM TOOTH EXTRACTION     Family History  Problem Relation Age of Onset  . Heart disease Mother     pacemaker  . Diabetes Mother   . Heart disease Father     myocardial infarction  . Hypertension Father   . Diabetes Father   . CVA Father   . Colon cancer Neg Hx   . Prostate cancer Neg Hx    Social History   Social History  . Marital status: Married    Spouse name: N/A  . Number of children: N/A  . Years of education: N/A   Occupational History  .  [employer]   Social History Main Topics  . Smoking status: Never Smoker  . Smokeless tobacco: Never Used  . Alcohol use 0.0 oz/week     Comment: 2-3 beers daily  . Drug use: No  . Sexual activity: Not Asked   Other Topics Concern  . None   Social History Narrative  . None    Outpatient Encounter Prescriptions as of 01/01/2016  Medication Sig  . aspirin EC 81 MG tablet Take 81 mg by mouth daily.  . cholecalciferol (VITAMIN D) 1000 units tablet Take  1,000 Units by mouth daily.  Marland Kitchen. losartan (COZAAR) 50 MG tablet TAKE ONE TABLET BY MOUTH ONCE DAILY  . rosuvastatin (CRESTOR) 10 MG tablet Take 1 tablet (10 mg total) by mouth daily.   No facility-administered encounter medications on file as of 01/01/2016.     Review of Systems  Constitutional: Negative for appetite change and unexpected weight change.  HENT: Negative for congestion and sinus pressure.   Respiratory: Negative for cough, chest tightness and shortness of breath.   Cardiovascular: Negative for chest pain, palpitations and leg swelling.  Gastrointestinal: Negative for abdominal pain, diarrhea, nausea and vomiting.  Genitourinary: Negative for difficulty urinating and dysuria.  Musculoskeletal: Negative for back pain and joint swelling.  Skin: Negative for color change and rash.  Neurological: Negative for dizziness, light-headedness and headaches.       No headache now.    Psychiatric/Behavioral: Negative for agitation and dysphoric mood.       Objective:    Physical Exam  Constitutional: He appears well-developed and well-nourished. No distress.  HENT:  Nose: Nose normal.  Mouth/Throat: Oropharynx is clear and moist.  Neck: Neck supple. No thyromegaly present.  Cardiovascular: Normal rate and regular rhythm.  Pulmonary/Chest: Effort normal and breath sounds normal. No respiratory distress.  Abdominal: Soft. Bowel sounds are normal. There is no tenderness.  Musculoskeletal: He exhibits no edema or tenderness.  Lymphadenopathy:    He has no cervical adenopathy.  Skin: No rash noted. No erythema.  Psychiatric: He has a normal mood and affect. His behavior is normal.    BP 130/70   Pulse 83   Temp 97.4 F (36.3 C) (Oral)   Ht 6' (1.829 m)   Wt 158 lb 6.4 oz (71.8 kg)   SpO2 98%   BMI 21.48 kg/m  Wt Readings from Last 3 Encounters:  01/01/16 158 lb 6.4 oz (71.8 kg)  11/09/15 157 lb 6.4 oz (71.4 kg)  07/09/15 156 lb (70.8 kg)     Lab Results  Component  Value Date   WBC 4.6 12/29/2015   HGB 14.9 12/29/2015   HCT 43.0 12/29/2015   PLT 234.0 12/29/2015   GLUCOSE 96 12/29/2015   CHOL 219 (H) 12/29/2015   TRIG 108.0 12/29/2015   HDL 77.60 12/29/2015   LDLDIRECT 144.2 08/22/2012   LDLCALC 120 (H) 12/29/2015   ALT 22 12/29/2015   AST 21 12/29/2015   NA 136 12/29/2015   K 4.6 12/29/2015   CL 100 12/29/2015   CREATININE 0.90 12/29/2015   BUN 11 12/29/2015   CO2 29 12/29/2015   TSH 1.23 07/17/2015   PSA 1.06 12/29/2015    Dg Chest 1 View  Result Date: 07/09/2015 CLINICAL DATA:  Syncope EXAM: CHEST 1 VIEW COMPARISON:  None. FINDINGS: There is no edema or consolidation. Heart size and pulmonary vascularity are normal. No adenopathy. No bone lesions. IMPRESSION: No edema or consolidation. Electronically Signed   By: Bretta BangWilliam  Woodruff III M.D.   On: 07/09/2015 12:26       Assessment & Plan:   Problem List Items Addressed This Visit    Hypercholesterolemia    Reviewed cholesterol levels.  Started low dose crestor.  Follow.        Hypertension    Blood pressure doing better.  Continue losartan.  Follow metabolic panel.        Sleeping difficulty    Discussed with him today.  Discussed not sleeping.  Discussed frequent awakenings.  Some fatigue.  Discussed the possibility of sleep apnea.  Discussed obtaining a sleep study.  He wants to think about this and will notify me if desires to purse.          I spent 25 minutes with the patient and more than 50% of the time was spent in consultation regarding the above.     Dale DurhamSCOTT, Karlin Binion, MD

## 2016-01-03 ENCOUNTER — Encounter: Payer: Self-pay | Admitting: Internal Medicine

## 2016-01-03 NOTE — Assessment & Plan Note (Signed)
Blood pressure doing better.  Continue losartan.  Follow metabolic panel.

## 2016-01-03 NOTE — Assessment & Plan Note (Signed)
Discussed with him today.  Discussed not sleeping.  Discussed frequent awakenings.  Some fatigue.  Discussed the possibility of sleep apnea.  Discussed obtaining a sleep study.  He wants to think about this and will notify me if desires to purse.

## 2016-01-03 NOTE — Assessment & Plan Note (Signed)
Reviewed cholesterol levels.  Started low dose crestor.  Follow.

## 2016-02-10 ENCOUNTER — Other Ambulatory Visit (INDEPENDENT_AMBULATORY_CARE_PROVIDER_SITE_OTHER): Payer: BLUE CROSS/BLUE SHIELD

## 2016-02-10 ENCOUNTER — Encounter: Payer: Self-pay | Admitting: Internal Medicine

## 2016-02-10 DIAGNOSIS — E7800 Pure hypercholesterolemia, unspecified: Secondary | ICD-10-CM

## 2016-02-10 LAB — HEPATIC FUNCTION PANEL
ALK PHOS: 66 U/L (ref 39–117)
ALT: 35 U/L (ref 0–53)
AST: 32 U/L (ref 0–37)
Albumin: 4.4 g/dL (ref 3.5–5.2)
BILIRUBIN DIRECT: 0.1 mg/dL (ref 0.0–0.3)
Total Bilirubin: 0.5 mg/dL (ref 0.2–1.2)
Total Protein: 7.3 g/dL (ref 6.0–8.3)

## 2016-05-02 ENCOUNTER — Ambulatory Visit (INDEPENDENT_AMBULATORY_CARE_PROVIDER_SITE_OTHER): Payer: BLUE CROSS/BLUE SHIELD | Admitting: Internal Medicine

## 2016-05-02 ENCOUNTER — Encounter: Payer: Self-pay | Admitting: Internal Medicine

## 2016-05-02 DIAGNOSIS — G479 Sleep disorder, unspecified: Secondary | ICD-10-CM

## 2016-05-02 DIAGNOSIS — I10 Essential (primary) hypertension: Secondary | ICD-10-CM

## 2016-05-02 DIAGNOSIS — E7800 Pure hypercholesterolemia, unspecified: Secondary | ICD-10-CM

## 2016-05-02 DIAGNOSIS — E559 Vitamin D deficiency, unspecified: Secondary | ICD-10-CM | POA: Diagnosis not present

## 2016-05-02 NOTE — Progress Notes (Signed)
Pre-visit discussion using our clinic review tool. No additional management support is needed unless otherwise documented below in the visit note.  

## 2016-05-02 NOTE — Progress Notes (Signed)
Patient ID: TARRANCE JANUSZEWSKI, male   DOB: 1950-07-18, 66 y.o.   MRN: [id number]   Subjective:    Patient ID: Scott Cohen, male    DOB: September 20, 1950, 66 y.o.   MRN: [id number]  HPI  Patient here for a scheduled follow up.  States he is doing well.  Handling stress.  Still not sleeping as well.  Discussed behavior modification.  He falls asleep with his grandson and then wakes up in his bed and Will hold on medication.  Tries to stay active.  No chest pain.  No sob.  No acid reflux.  No abdominal pain.  Bowels moving.  Tolerating crestor.     Past Medical History:  Diagnosis Date  . Hypercholesterolemia   . Hypertension   . Leukopenia    benign cyclical   Past Surgical History:  Procedure Laterality Date  . WISDOM TOOTH EXTRACTION     Family History  Problem Relation Age of Onset  . Heart disease Mother     pacemaker  . Diabetes Mother   . Heart disease Father     myocardial infarction  . Hypertension Father   . Diabetes Father   . CVA Father   . Colon cancer Neg Hx   . Prostate cancer Neg Hx    Social History   Social History  . Marital status: Married    Spouse name: N/A  . Number of children: N/A  . Years of education: N/A   Occupational History  .  [employer]   Social History Main Topics  . Smoking status: Never Smoker  . Smokeless tobacco: Never Used  . Alcohol use 0.0 oz/week     Comment: 2-3 beers daily  . Drug use: No  . Sexual activity: Not Asked   Other Topics Concern  . None   Social History Narrative  . None    Outpatient Encounter Prescriptions as of 05/02/2016  Medication Sig  . aspirin EC 81 MG tablet Take 81 mg by mouth daily.  . cholecalciferol (VITAMIN D) 1000 units tablet Take 1,000 Units by mouth daily.  Marland Kitchen losartan (COZAAR) 50 MG tablet TAKE ONE TABLET BY MOUTH ONCE DAILY  . rosuvastatin (CRESTOR) 10 MG tablet Take 1 tablet (10 mg total) by mouth daily.   No facility-administered encounter medications on file as of 05/02/2016.      Review of Systems  Constitutional: Negative for appetite change and unexpected weight change.  HENT: Negative for congestion and sinus pressure.   Respiratory: Negative for cough, chest tightness and shortness of breath.   Cardiovascular: Negative for chest pain, palpitations and leg swelling.  Gastrointestinal: Negative for abdominal pain, diarrhea, nausea and vomiting.  Genitourinary: Negative for difficulty urinating and dysuria.  Musculoskeletal: Negative for back pain and joint swelling.  Skin: Negative for color change and rash.  Neurological: Negative for dizziness and headaches.  Psychiatric/Behavioral: Negative for agitation and dysphoric mood.       Objective:    Physical Exam  Constitutional: He appears well-developed and well-nourished. No distress.  HENT:  Nose: Nose normal.  Mouth/Throat: Oropharynx is clear and moist.  Neck: Neck supple. No thyromegaly present.  Cardiovascular: Normal rate and regular rhythm.   Pulmonary/Chest: Effort normal and breath sounds normal. No respiratory distress.  Abdominal: Soft. Bowel sounds are normal. There is no tenderness.  Musculoskeletal: He exhibits no edema or tenderness.  Lymphadenopathy:    He has no cervical adenopathy.  Skin: No rash noted. No erythema.  Psychiatric: He has a normal  mood and affect. His behavior is normal.    BP 124/82 (BP Location: Left Arm, Patient Position: Sitting, Cuff Size: Normal)   Pulse 83   Temp 98.4 F (36.9 C) (Oral)   Resp 14   Ht 6' (1.829 m)   Wt 162 lb 3.2 oz (73.6 kg)   SpO2 96%   BMI 22.00 kg/m  Wt Readings from Last 3 Encounters:  05/02/16 162 lb 3.2 oz (73.6 kg)  01/01/16 158 lb 6.4 oz (71.8 kg)  11/09/15 157 lb 6.4 oz (71.4 kg)     Lab Results  Component Value Date   WBC 4.6 12/29/2015   HGB 14.9 12/29/2015   HCT 43.0 12/29/2015   PLT 234.0 12/29/2015   GLUCOSE 96 12/29/2015   CHOL 219 (H) 12/29/2015   TRIG 108.0 12/29/2015   HDL 77.60 12/29/2015   LDLDIRECT  144.2 08/22/2012   LDLCALC 120 (H) 12/29/2015   ALT 35 02/10/2016   AST 32 02/10/2016   NA 136 12/29/2015   K 4.6 12/29/2015   CL 100 12/29/2015   CREATININE 0.90 12/29/2015   BUN 11 12/29/2015   CO2 29 12/29/2015   TSH 1.23 07/17/2015   PSA 1.06 12/29/2015    Dg Chest 1 View  Result Date: 07/09/2015 CLINICAL DATA:  Syncope EXAM: CHEST 1 VIEW COMPARISON:  None. FINDINGS: There is no edema or consolidation. Heart size and pulmonary vascularity are normal. No adenopathy. No bone lesions. IMPRESSION: No edema or consolidation. Electronically Signed   By: Bretta Bang III M.D.   On: 07/09/2015 12:26       Assessment & Plan:   Problem List Items Addressed This Visit    Hypercholesterolemia    On crestor.  Low cholesterol diet and exercise.  Follow lipid panel and liver function tests.        Relevant Orders   Hepatic function panel   Lipid panel   Hypertension    Blood pressure under good control.  Continue same medication regimen.  Follow pressures.  Follow metabolic panel.        Relevant Orders   Basic metabolic panel   Sleeping difficulty    Discussed behavioral modification.  Discussed not falling asleep in his grandson's bed.  Discussed options.  Hold on medication.  Follow.       Vitamin D deficiency    Continue vitamin D supplements.  Follow vitamin D level.            Dale Bertsch-[place], MD

## 2016-05-08 NOTE — Assessment & Plan Note (Signed)
Discussed behavioral modification.  Discussed not falling asleep in his grandson's bed.  Discussed options.  Hold on medication.  Follow.

## 2016-05-08 NOTE — Assessment & Plan Note (Signed)
On crestor.  Low cholesterol diet and exercise.  Follow lipid panel and liver function tests.   

## 2016-05-08 NOTE — Assessment & Plan Note (Signed)
Continue vitamin D supplements.  Follow vitamin D level.   

## 2016-05-08 NOTE — Assessment & Plan Note (Signed)
Blood pressure under good control.  Continue same medication regimen.  Follow pressures.  Follow metabolic panel.   

## 2016-08-17 ENCOUNTER — Other Ambulatory Visit: Payer: BLUE CROSS/BLUE SHIELD

## 2016-08-18 ENCOUNTER — Other Ambulatory Visit (INDEPENDENT_AMBULATORY_CARE_PROVIDER_SITE_OTHER): Payer: BLUE CROSS/BLUE SHIELD

## 2016-08-18 DIAGNOSIS — I10 Essential (primary) hypertension: Secondary | ICD-10-CM | POA: Diagnosis not present

## 2016-08-18 DIAGNOSIS — E7800 Pure hypercholesterolemia, unspecified: Secondary | ICD-10-CM

## 2016-08-18 LAB — BASIC METABOLIC PANEL
BUN: 11 mg/dL (ref 6–23)
CHLORIDE: 100 meq/L (ref 96–112)
CO2: 26 mEq/L (ref 19–32)
CREATININE: 0.9 mg/dL (ref 0.40–1.50)
Calcium: 9.6 mg/dL (ref 8.4–10.5)
GFR: 89.75 mL/min (ref >60.00)
Glucose, Bld: 88 mg/dL (ref 70–99)
Potassium: 4.3 mEq/L (ref 3.5–5.1)
Sodium: 134 mEq/L — ABNORMAL LOW (ref 135–145)

## 2016-08-18 LAB — HEPATIC FUNCTION PANEL
ALK PHOS: 60 U/L (ref 39–117)
ALT: 27 U/L (ref 0–53)
AST: 32 U/L (ref 0–37)
Albumin: 4.6 g/dL (ref 3.5–5.2)
BILIRUBIN TOTAL: 0.6 mg/dL (ref 0.2–1.2)
Bilirubin, Direct: 0.2 mg/dL (ref 0.0–0.3)
Total Protein: 7.2 g/dL (ref 6.0–8.3)

## 2016-08-18 LAB — LIPID PANEL
Cholesterol: 160 mg/dL (ref 0–200)
HDL: 74.3 mg/dL (ref >39.00)
LDL CALC: 65 mg/dL (ref 0–99)
NONHDL: 86.14
Total CHOL/HDL Ratio: 2
Triglycerides: 105 mg/dL (ref 0.0–149.0)
VLDL: 21 mg/dL (ref 0.0–40.0)

## 2016-08-19 ENCOUNTER — Encounter: Payer: Self-pay | Admitting: Internal Medicine

## 2016-08-19 ENCOUNTER — Ambulatory Visit (INDEPENDENT_AMBULATORY_CARE_PROVIDER_SITE_OTHER): Payer: BLUE CROSS/BLUE SHIELD | Admitting: Internal Medicine

## 2016-08-19 VITALS — BP 150/100 | HR 70 | Temp 98.5°F | Resp 12 | Ht 71.0 in | Wt 160.4 lb

## 2016-08-19 DIAGNOSIS — G479 Sleep disorder, unspecified: Secondary | ICD-10-CM

## 2016-08-19 DIAGNOSIS — B351 Tinea unguium: Secondary | ICD-10-CM

## 2016-08-19 DIAGNOSIS — Z0000 Encounter for general adult medical examination without abnormal findings: Secondary | ICD-10-CM | POA: Diagnosis not present

## 2016-08-19 DIAGNOSIS — E559 Vitamin D deficiency, unspecified: Secondary | ICD-10-CM

## 2016-08-19 DIAGNOSIS — R238 Other skin changes: Secondary | ICD-10-CM | POA: Diagnosis not present

## 2016-08-19 DIAGNOSIS — E7800 Pure hypercholesterolemia, unspecified: Secondary | ICD-10-CM

## 2016-08-19 DIAGNOSIS — I10 Essential (primary) hypertension: Secondary | ICD-10-CM

## 2016-08-19 MED ORDER — LOSARTAN POTASSIUM 100 MG PO TABS: 100.0000 mg | ORAL_TABLET | Freq: Every day | ORAL | 1 refills | Status: DC

## 2016-08-19 NOTE — Progress Notes (Signed)
Patient ID: KEIANDRE CYGAN, male   DOB: [birth year], 66 y.o.   MRN: [id number]   Subjective:    Patient ID: [name], male    DOB: [birth year], 66 y.o.   MRN: [id number]  HPI  Patient here for his physical exam.  States he is doing relatively well.  Handling stress relatively well. No chest pain.  No sob.  No acid reflux.  No abdominal pain.  Bowels moving.  Discussed his sleeping issues.  Is some better.  Concerned regarding changes in color and thickening of toe nails.  Discussed treatment.  Discussed referral to podiatry.  He request referral to dermatology for evaluation of persistent scalp irritation.  Blood pressure elevated.  Not exercising regularly.  Tries to stay active.     Past Medical History:  Diagnosis Date  . Hypercholesterolemia   . Hypertension   . Leukopenia    benign cyclical   Past Surgical History:  Procedure Laterality Date  . WISDOM TOOTH EXTRACTION     Family History  Problem Relation Age of Onset  . Heart disease Mother        pacemaker  . Diabetes Mother   . Heart disease Father        myocardial infarction  . Hypertension Father   . Diabetes Father   . CVA Father   . Colon cancer Neg Hx   . Prostate cancer Neg Hx    Social History   Social History  . Marital status: Married    Spouse name: N/A  . Number of children: N/A  . Years of education: N/A   Occupational History  .  [employer]   Social History Main Topics  . Smoking status: Never Smoker  . Smokeless tobacco: Never Used  . Alcohol use 0.0 oz/week     Comment: 2-3 beers daily  . Drug use: No  . Sexual activity: Not Asked   Other Topics Concern  . None   Social History Narrative  . None    Outpatient Encounter Prescriptions as of 08/19/2016  Medication Sig  . aspirin EC 81 MG tablet Take 81 mg by mouth daily.  . rosuvastatin (CRESTOR) 10 MG tablet Take 1 tablet (10 mg total) by mouth daily.  . [DISCONTINUED] cholecalciferol (VITAMIN D) 1000 units tablet Take 1,000  Units by mouth daily.  . [DISCONTINUED] losartan (COZAAR) 50 MG tablet TAKE ONE TABLET BY MOUTH ONCE DAILY  . losartan (COZAAR) 100 MG tablet Take 1 tablet (100 mg total) by mouth daily.   No facility-administered encounter medications on file as of 08/19/2016.     Review of Systems  Constitutional: Negative for appetite change and unexpected weight change.  HENT: Negative for congestion and sinus pressure.   Eyes: Negative for pain and visual disturbance.  Respiratory: Negative for cough, chest tightness and shortness of breath.   Cardiovascular: Negative for chest pain, palpitations and leg swelling.  Gastrointestinal: Negative for abdominal pain, diarrhea, nausea and vomiting.  Genitourinary: Negative for difficulty urinating and dysuria.  Musculoskeletal: Negative for back pain and joint swelling.  Skin: Negative for rash.       Scalp irritation as outlined.    Neurological: Negative for dizziness, light-headedness and headaches.  Hematological: Negative for adenopathy. Does not bruise/bleed easily.  Psychiatric/Behavioral: Negative for agitation and dysphoric mood.       Objective:    Physical Exam  Constitutional: He is oriented to person, place, and time. He appears well-developed and well-nourished. No distress.  HENT:  Head: Normocephalic  and atraumatic.  Nose: Nose normal.  Mouth/Throat: Oropharynx is clear and moist. No oropharyngeal exudate.  Eyes: Conjunctivae are normal. Right eye exhibits no discharge. Left eye exhibits no discharge.  Neck: Neck supple. No thyromegaly present.  Cardiovascular: Normal rate and regular rhythm.   Pulmonary/Chest: Breath sounds normal. No respiratory distress. He has no wheezes.  Abdominal: Soft. Bowel sounds are normal. There is no tenderness.  Genitourinary:  Genitourinary Comments: Rectal exam - enlarge prostate.  No palpable nodules.    Musculoskeletal: He exhibits no edema or tenderness.  Lymphadenopathy:    He has no cervical  adenopathy.  Neurological: He is alert and oriented to person, place, and time.  Skin: Skin is warm and dry. No rash noted. No erythema.  Psychiatric: He has a normal mood and affect. His behavior is normal.    BP (!) 150/100 (BP Location: Left Arm, Patient Position: Sitting, Cuff Size: Normal)   Pulse 70   Temp 98.5 F (36.9 C) (Oral)   Resp 12   Ht 5\' 11"  (1.803 m)   Wt 160 lb 6.4 oz (72.8 kg)   SpO2 97%   BMI 22.37 kg/m  Wt Readings from Last 3 Encounters:  08/19/16 160 lb 6.4 oz (72.8 kg)  05/02/16 162 lb 3.2 oz (73.6 kg)  01/01/16 158 lb 6.4 oz (71.8 kg)     Lab Results  Component Value Date   WBC 4.6 12/29/2015   HGB 14.9 12/29/2015   HCT 43.0 12/29/2015   PLT 234.0 12/29/2015   GLUCOSE 88 08/18/2016   CHOL 160 08/18/2016   TRIG 105.0 08/18/2016   HDL 74.30 08/18/2016   LDLDIRECT 144.2 08/22/2012   LDLCALC 65 08/18/2016   ALT 27 08/18/2016   AST 32 08/18/2016   NA 134 (L) 08/18/2016   K 4.3 08/18/2016   CL 100 08/18/2016   CREATININE 0.90 08/18/2016   BUN 11 08/18/2016   CO2 26 08/18/2016   TSH 1.23 07/17/2015   PSA 1.06 12/29/2015    Dg Chest 1 View  Result Date: 07/09/2015 CLINICAL DATA:  Syncope EXAM: CHEST 1 VIEW COMPARISON:  None. FINDINGS: There is no edema or consolidation. Heart size and pulmonary vascularity are normal. No adenopathy. No bone lesions. IMPRESSION: No edema or consolidation. Electronically Signed   By: Bretta Bang III M.D.   On: 07/09/2015 12:26       Assessment & Plan:   Problem List Items Addressed This Visit    Health care maintenance    Physical today 08/19/16.  Colonoscopy 02/13/07 - normal.  PSA 12/29/15- 1.06.       Hypercholesterolemia    On crestor.  Low cholesterol diet and exercise.  Follow lipid panel and liver function tests.        Relevant Medications   losartan (COZAAR) 100 MG tablet   Hypertension - Primary    Blood pressure elevated.  Increase losartan to 100mg  q day.  Follow pressures.  Follow  metabolic panel.        Relevant Medications   losartan (COZAAR) 100 MG tablet   Sleeping difficulty    Discussed with him today.  Appears to be some better.  Follow.        Vitamin D deficiency    Follow vitamin D level.         Other Visit Diagnoses    Toenail fungus       Persistent thickening and discoloration.  Refer to podiatry.     Relevant Orders   Ambulatory referral to  Podiatry   Scalp irritation       Persistent scalp irritation.  Request referral to dermatology.     Relevant Orders   Ambulatory referral to Dermatology       Dale DurhamSCOTT, Raffael Bugarin, MD

## 2016-08-19 NOTE — Assessment & Plan Note (Addendum)
Physical today 08/19/16.  Colonoscopy 02/13/07 - normal.  PSA 12/29/15- 1.06.

## 2016-08-19 NOTE — Progress Notes (Signed)
Pre-visit discussion using our clinic review tool. No additional management support is needed unless otherwise documented below in the visit note.  

## 2016-08-21 ENCOUNTER — Encounter: Payer: Self-pay | Admitting: Internal Medicine

## 2016-08-21 NOTE — Assessment & Plan Note (Signed)
Discussed with him today.  Appears to be some better.  Follow.

## 2016-08-21 NOTE — Assessment & Plan Note (Signed)
Follow vitamin D level.  

## 2016-08-21 NOTE — Assessment & Plan Note (Signed)
On crestor.  Low cholesterol diet and exercise.  Follow lipid panel and liver function tests.   

## 2016-08-21 NOTE — Assessment & Plan Note (Signed)
Blood pressure elevated.  Increase losartan to 100mg q day.  Follow pressures.  Follow metabolic panel.   

## 2016-09-12 ENCOUNTER — Encounter: Payer: Self-pay | Admitting: Podiatry

## 2016-09-12 ENCOUNTER — Ambulatory Visit (INDEPENDENT_AMBULATORY_CARE_PROVIDER_SITE_OTHER): Payer: BLUE CROSS/BLUE SHIELD | Admitting: Podiatry

## 2016-09-12 VITALS — BP 143/96 | HR 69 | Temp 98.7°F | Resp 16

## 2016-09-12 DIAGNOSIS — B351 Tinea unguium: Secondary | ICD-10-CM

## 2016-09-12 DIAGNOSIS — M79676 Pain in unspecified toe(s): Secondary | ICD-10-CM | POA: Diagnosis not present

## 2016-09-12 NOTE — Progress Notes (Signed)
   Subjective:    Patient ID: Rosalyn Gessavid A Urquidi, male    DOB: Mar 17, 1950, 66 y.o.   MRN: [id number]  HPI this patient presents the office with chief complaint of nail fungus on all of his toenails and the toenail on his right big toe is coming off.  He says that the nails are occasionally painful walking and wearing his shoes.  He believed he had a fungal infection and treated himself with Lotrisone spray with minimal results.  He presents the office today for an evaluation and treatment of these discolored and disfigured toenails, both feet    Review of Systems  All other systems reviewed and are negative.      Objective:   Physical Exam GENERAL APPEARANCE: Alert, conversant. Appropriately groomed. No acute distress.  VASCULAR: Pedal pulses are  palpable at  [facility] and PT bilateral.  Capillary refill time is immediate to all digits,  Normal temperature gradient.  Digital hair growth is present bilateral  NEUROLOGIC: sensation is normal to 5.07 monofilament at 5/5 sites bilateral.  Light touch is intact bilateral, Muscle strength normal.  MUSCULOSKELETAL: acceptable muscle strength, tone and stability bilateral.  Intrinsic muscluature intact bilateral.  Rectus appearance of foot and digits noted bilateral.  NAILS  thick disfigured discolored nails with subungual debris noted both feet.  The nail plate on the right hallux is unattached from the nailbed.  No evidence of any redness, swelling or infection DERMATOLOGIC: skin color, texture, and turgor are within normal limits.  No preulcerative lesions or ulcers  are seen, no interdigital maceration noted.  No open lesions present.  . No drainage noted.         Assessment & Plan:  Onychomycosis  B/L.   IE  Debride nails.  RTC prn.   Helane GuntherGregory Valyn Latchford DPM

## 2016-09-29 ENCOUNTER — Ambulatory Visit (INDEPENDENT_AMBULATORY_CARE_PROVIDER_SITE_OTHER): Payer: BLUE CROSS/BLUE SHIELD | Admitting: Internal Medicine

## 2016-09-29 ENCOUNTER — Encounter: Payer: Self-pay | Admitting: Internal Medicine

## 2016-09-29 VITALS — BP 134/84 | HR 71 | Temp 97.7°F | Resp 16 | Ht 71.0 in | Wt 160.8 lb

## 2016-09-29 DIAGNOSIS — Z125 Encounter for screening for malignant neoplasm of prostate: Secondary | ICD-10-CM | POA: Diagnosis not present

## 2016-09-29 DIAGNOSIS — E7800 Pure hypercholesterolemia, unspecified: Secondary | ICD-10-CM

## 2016-09-29 DIAGNOSIS — G479 Sleep disorder, unspecified: Secondary | ICD-10-CM

## 2016-09-29 DIAGNOSIS — I10 Essential (primary) hypertension: Secondary | ICD-10-CM | POA: Diagnosis not present

## 2016-09-29 DIAGNOSIS — F439 Reaction to severe stress, unspecified: Secondary | ICD-10-CM | POA: Diagnosis not present

## 2016-09-29 NOTE — Progress Notes (Signed)
Patient ID: Scott Cohen, male   DOB: Dec 10, 1950, 66 y.o.   MRN: [id number]   Subjective:    Patient ID: Scott Cohen, male    DOB: Dec 10, 1950, 66 y.o.   MRN: [id number]  HPI  Patient here for a scheduled follow up.  States he is doing better now.  Increased stress recently.  Was feeling a little down.  Increased stress with work and work decisions.  States discussed with his wife.  Feeling better.  Doing better.  Sleeping is better.  Still has some nights that he wakes up and starts thinking about things, but overall is better.  No chest pain.  No sob.  No acid reflux.  No abdominal pain.  Bowels moving.     Past Medical History:  Diagnosis Date  . Hypercholesterolemia   . Hypertension   . Leukopenia    benign cyclical   Past Surgical History:  Procedure Laterality Date  . WISDOM TOOTH EXTRACTION     Family History  Problem Relation Age of Onset  . Heart disease Mother        pacemaker  . Diabetes Mother   . Heart disease Father        myocardial infarction  . Hypertension Father   . Diabetes Father   . CVA Father   . Colon cancer Neg Hx   . Prostate cancer Neg Hx    Social History   Social History  . Marital status: Married    Spouse name: N/A  . Number of children: N/A  . Years of education: N/A   Occupational History  .  [employer]   Social History Main Topics  . Smoking status: Never Smoker  . Smokeless tobacco: Never Used  . Alcohol use 0.0 oz/week     Comment: 2-3 beers daily  . Drug use: No  . Sexual activity: Not Asked   Other Topics Concern  . None   Social History Narrative  . None    Outpatient Encounter Prescriptions as of 09/29/2016  Medication Sig  . aspirin EC 81 MG tablet Take 81 mg by mouth daily.  Marland Kitchen. losartan (COZAAR) 100 MG tablet Take 1 tablet (100 mg total) by mouth daily.  . rosuvastatin (CRESTOR) 10 MG tablet Take 1 tablet (10 mg total) by mouth daily.   No facility-administered encounter medications on file as of 09/29/2016.      Review of Systems  Constitutional: Negative for appetite change and unexpected weight change.  HENT: Negative for congestion and sinus pressure.   Respiratory: Negative for cough, chest tightness and shortness of breath.   Cardiovascular: Negative for chest pain, palpitations and leg swelling.  Gastrointestinal: Negative for abdominal pain, diarrhea, nausea and vomiting.  Genitourinary: Negative for difficulty urinating and dysuria.  Musculoskeletal: Negative for back pain and joint swelling.  Skin: Negative for color change and rash.  Neurological: Negative for dizziness, light-headedness and headaches.  Psychiatric/Behavioral: Negative for agitation and dysphoric mood.       Objective:     Blood pressure rechecked by me:  134/84  Physical Exam  Constitutional: He appears well-developed and well-nourished. No distress.  HENT:  Nose: Nose normal.  Mouth/Throat: Oropharynx is clear and moist.  Neck: Neck supple. No thyromegaly present.  Cardiovascular: Normal rate and regular rhythm.   Pulmonary/Chest: Effort normal and breath sounds normal. No respiratory distress.  Abdominal: Soft. Bowel sounds are normal. There is no tenderness.  Musculoskeletal: He exhibits no edema or tenderness.  Lymphadenopathy:    He  has no cervical adenopathy.  Skin: No rash noted. No erythema.  Psychiatric: He has a normal mood and affect. His behavior is normal.    BP 134/84   Pulse 71   Temp 97.7 F (36.5 C) (Oral)   Resp 16   Ht 5\' 11"  (1.803 m)   Wt 160 lb 12.8 oz (72.9 kg)   SpO2 96%   BMI 22.43 kg/m  Wt Readings from Last 3 Encounters:  09/29/16 160 lb 12.8 oz (72.9 kg)  08/19/16 160 lb 6.4 oz (72.8 kg)  05/02/16 162 lb 3.2 oz (73.6 kg)     Lab Results  Component Value Date   WBC 4.6 12/29/2015   HGB 14.9 12/29/2015   HCT 43.0 12/29/2015   PLT 234.0 12/29/2015   GLUCOSE 88 08/18/2016   CHOL 160 08/18/2016   TRIG 105.0 08/18/2016   HDL 74.30 08/18/2016   LDLDIRECT 144.2  08/22/2012   LDLCALC 65 08/18/2016   ALT 27 08/18/2016   AST 32 08/18/2016   NA 134 (L) 08/18/2016   K 4.3 08/18/2016   CL 100 08/18/2016   CREATININE 0.90 08/18/2016   BUN 11 08/18/2016   CO2 26 08/18/2016   TSH 1.23 07/17/2015   PSA 1.06 12/29/2015       Assessment & Plan:   Problem List Items Addressed This Visit    Essential (primary) hypertension    On losartan 100mg  now.  Blood pressure as outlined.  Continue current medication regimen.  Follow pressures.  Follow metabolic panel.       Relevant Orders   TSH   CBC with Differential/Platelet   Basic metabolic panel   Pure hypercholesterolemia    On crestor.  Low cholesterol diet and exercise.  Follow lipid panel and liver function tests.        Relevant Orders   Lipid panel   Hepatic function panel   Sleeping difficulty    Discussed with him today.  Discussed modifying behavior.  He is sleeping better.  Desires no further intervention.  Follow.       Stress    Increased stress as outlined.  Discussed with him today.  He is feeling better.  Does not feel he needs anything more at this time.  Follow.        Other Visit Diagnoses    Prostate cancer screening    -  Primary   Relevant Orders   PSA       Dale [place], MD

## 2016-09-29 NOTE — Progress Notes (Signed)
Pre-visit discussion using our clinic review tool. No additional management support is needed unless otherwise documented below in the visit note.  

## 2016-10-02 ENCOUNTER — Encounter: Payer: Self-pay | Admitting: Internal Medicine

## 2016-10-02 DIAGNOSIS — F439 Reaction to severe stress, unspecified: Secondary | ICD-10-CM | POA: Insufficient documentation

## 2016-10-02 NOTE — Assessment & Plan Note (Signed)
Increased stress as outlined.  Discussed with him today.  He is feeling better.  Does not feel he needs anything more at this time.  Follow.

## 2016-10-02 NOTE — Assessment & Plan Note (Signed)
On crestor.  Low cholesterol diet and exercise.  Follow lipid panel and liver function tests.   

## 2016-10-02 NOTE — Assessment & Plan Note (Signed)
Discussed with him today.  Discussed modifying behavior.  He is sleeping better.  Desires no further intervention.  Follow.

## 2016-10-02 NOTE — Assessment & Plan Note (Signed)
On losartan 100mg  now.  Blood pressure as outlined.  Continue current medication regimen.  Follow pressures.  Follow metabolic panel.

## 2016-11-16 ENCOUNTER — Encounter: Payer: Self-pay | Admitting: Internal Medicine

## 2016-11-16 MED ORDER — LOSARTAN POTASSIUM 100 MG PO TABS: 100.0000 mg | ORAL_TABLET | Freq: Every day | ORAL | 1 refills | Status: DC

## 2016-11-30 DIAGNOSIS — B351 Tinea unguium: Secondary | ICD-10-CM | POA: Diagnosis not present

## 2016-12-12 ENCOUNTER — Ambulatory Visit: Payer: Self-pay | Admitting: *Deleted

## 2016-12-12 NOTE — Telephone Encounter (Signed)
Given home care instructions.   Instructed to call if he becomes worse or the symptoms do not resolve or improve in a day or two.   Also instructed to cal us back if he develops a fever 101 or higher.  Denied any chronic illnesses such as diabetes, etc.    Pt verbalized understanding of instructions.  Reason for Disposition . [1] Sore throat with cough/cold symptoms AND [2] present < 5 days  Answer Assessment - Initial Assessment Questions 1. ONSET: "When did the throat start hurting?" (Hours or days ago)      Saturday morning 2. SEVERITY: "How bad is the sore throat?" (Scale 1-10; mild, moderate or severe)   - MILD (1-3):  doesn't interfere with eating or normal activities   - MODERATE (4-7): interferes with eating some solids and normal activities   - SEVERE (8-10):  excruciating pain, interferes with most normal activities   - SEVERE DYSPHAGIA: can't swallow liquids, drooling     No 3. STREP EXPOSURE: "Has there been any exposure to strep within the past week?" If so, ask: "What type of contact occurred?"      No 4.  VIRAL SYMPTOMS: "Are there any symptoms of a cold, such as a runny nose, cough, hoarse voice or red eyes?"      Stuffy nose, very sore throat, low grade fever, ears full 5. FEVER: "Do you have a fever?" If so, ask: "What is your temperature, how was it measured, and when did it start?"     Not this morning 6. PUS ON THE TONSILS: "Is there pus on the tonsils in the back of your throat?"     Not looked 7. OTHER SYMPTOMS: "Do you have any other symptoms?" (e.g., difficulty breathing, headache, rash)     Headache.   8. PREGNANCY: "Is there any chance you are pregnant?" "When was your last menstrual period?"     N/A  Protocols used: SORE THROAT-A-AH

## 2017-01-03 ENCOUNTER — Ambulatory Visit (INDEPENDENT_AMBULATORY_CARE_PROVIDER_SITE_OTHER): Payer: BLUE CROSS/BLUE SHIELD | Admitting: *Deleted

## 2017-01-03 ENCOUNTER — Other Ambulatory Visit (INDEPENDENT_AMBULATORY_CARE_PROVIDER_SITE_OTHER): Payer: BLUE CROSS/BLUE SHIELD

## 2017-01-03 DIAGNOSIS — I10 Essential (primary) hypertension: Secondary | ICD-10-CM | POA: Diagnosis not present

## 2017-01-03 DIAGNOSIS — E7800 Pure hypercholesterolemia, unspecified: Secondary | ICD-10-CM | POA: Diagnosis not present

## 2017-01-03 DIAGNOSIS — Z125 Encounter for screening for malignant neoplasm of prostate: Secondary | ICD-10-CM | POA: Diagnosis not present

## 2017-01-03 DIAGNOSIS — Z23 Encounter for immunization: Secondary | ICD-10-CM

## 2017-01-03 LAB — CBC WITH DIFFERENTIAL/PLATELET
BASOS PCT: 0.9 % (ref 0.0–3.0)
Basophils Absolute: 0 10*3/uL (ref 0.0–0.1)
EOS PCT: 2 % (ref 0.0–5.0)
Eosinophils Absolute: 0.1 10*3/uL (ref 0.0–0.7)
HEMATOCRIT: 40.5 % (ref 39.0–52.0)
Hemoglobin: 13.7 g/dL (ref 13.0–17.0)
LYMPHS ABS: 1.7 10*3/uL (ref 0.7–4.0)
Lymphocytes Relative: 42.3 % (ref 12.0–46.0)
MCHC: 33.9 g/dL (ref 30.0–36.0)
MCV: 90.6 fl (ref 78.0–100.0)
MONOS PCT: 12.3 % — AB (ref 3.0–12.0)
Monocytes Absolute: 0.5 10*3/uL (ref 0.1–1.0)
NEUTROS ABS: 1.7 10*3/uL (ref 1.4–7.7)
NEUTROS PCT: 42.5 % — AB (ref 43.0–77.0)
PLATELETS: 284 10*3/uL (ref 150.0–400.0)
RBC: 4.46 Mil/uL (ref 4.22–5.81)
RDW: 12.5 % (ref 11.5–15.5)
WBC: 4 10*3/uL (ref 4.0–10.5)

## 2017-01-03 LAB — BASIC METABOLIC PANEL
BUN: 10 mg/dL (ref 6–23)
CALCIUM: 9.7 mg/dL (ref 8.4–10.5)
CO2: 28 mEq/L (ref 19–32)
Chloride: 96 mEq/L (ref 96–112)
Creatinine, Ser: 0.84 mg/dL (ref 0.40–1.50)
GFR: 97.08 mL/min (ref >60.00)
GLUCOSE: 101 mg/dL — AB (ref 70–99)
Potassium: 4.8 mEq/L (ref 3.5–5.1)
SODIUM: 132 meq/L — AB (ref 135–145)

## 2017-01-03 LAB — LIPID PANEL
CHOL/HDL RATIO: 3
Cholesterol: 183 mg/dL (ref 0–200)
HDL: 72.7 mg/dL (ref >39.00)
LDL CALC: 84 mg/dL (ref 0–99)
NONHDL: 110.09
Triglycerides: 132 mg/dL (ref 0.0–149.0)
VLDL: 26.4 mg/dL (ref 0.0–40.0)

## 2017-01-03 LAB — HEPATIC FUNCTION PANEL
ALBUMIN: 4.6 g/dL (ref 3.5–5.2)
ALK PHOS: 63 U/L (ref 39–117)
ALT: 22 U/L (ref 0–53)
AST: 26 U/L (ref 0–37)
BILIRUBIN TOTAL: 0.5 mg/dL (ref 0.2–1.2)
Bilirubin, Direct: 0.1 mg/dL (ref 0.0–0.3)
Total Protein: 7.1 g/dL (ref 6.0–8.3)

## 2017-01-03 LAB — PSA: PSA: 0.81 ng/mL (ref 0.10–4.00)

## 2017-01-03 LAB — TSH: TSH: 1.56 u[IU]/mL (ref 0.35–4.50)

## 2017-01-04 ENCOUNTER — Encounter: Payer: Self-pay | Admitting: Internal Medicine

## 2017-01-06 ENCOUNTER — Ambulatory Visit (INDEPENDENT_AMBULATORY_CARE_PROVIDER_SITE_OTHER): Payer: BLUE CROSS/BLUE SHIELD | Admitting: Internal Medicine

## 2017-01-06 ENCOUNTER — Encounter: Payer: Self-pay | Admitting: Internal Medicine

## 2017-01-06 DIAGNOSIS — F439 Reaction to severe stress, unspecified: Secondary | ICD-10-CM | POA: Diagnosis not present

## 2017-01-06 DIAGNOSIS — K219 Gastro-esophageal reflux disease without esophagitis: Secondary | ICD-10-CM

## 2017-01-06 DIAGNOSIS — E7800 Pure hypercholesterolemia, unspecified: Secondary | ICD-10-CM | POA: Diagnosis not present

## 2017-01-06 DIAGNOSIS — E559 Vitamin D deficiency, unspecified: Secondary | ICD-10-CM | POA: Diagnosis not present

## 2017-01-06 DIAGNOSIS — I10 Essential (primary) hypertension: Secondary | ICD-10-CM | POA: Diagnosis not present

## 2017-01-06 MED ORDER — PANTOPRAZOLE SODIUM 40 MG PO TBEC: 40.0000 mg | DELAYED_RELEASE_TABLET | Freq: Every day | ORAL | 2 refills | Status: DC

## 2017-01-06 NOTE — Progress Notes (Signed)
Patient ID: Scott GessDavid A Cohen, male   DOB: 1950-09-08, 66 y.o.   MRN: [id number]   Subjective:    Patient ID: Scott Gessavid A Pedregon, male    DOB: 1950-09-08, 66 y.o.   MRN: [id number]  HPI  Patient here for a scheduled follow up.  States he is doing relatively well.  Does report increased acid reflux.  No problems swallowing.  No nausea or vomiting.  No abdominal pain.  Bowels moving.  Takes ibuprofen - maybe 8/week.  Discussed using tylenol instead.  Trying to stay active.  Does not do formal exercise, but does stay physically active.  No chest pain.  No sob.  Handling stress.  Doing better.    Past Medical History:  Diagnosis Date  . Hypercholesterolemia   . Hypertension   . Leukopenia    benign cyclical   Past Surgical History:  Procedure Laterality Date  . WISDOM TOOTH EXTRACTION     Family History  Problem Relation Age of Onset  . Heart disease Mother        pacemaker  . Diabetes Mother   . Heart disease Father        myocardial infarction  . Hypertension Father   . Diabetes Father   . CVA Father   . Colon cancer Neg Hx   . Prostate cancer Neg Hx    Social History   Socioeconomic History  . Marital status: Married    Spouse name: None  . Number of children: None  . Years of education: None  . Highest education level: None  Social Needs  . Financial resource strain: None  . Food insecurity - worry: None  . Food insecurity - inability: None  . Transportation needs - medical: None  . Transportation needs - non-medical: None  Occupational History    Employer: [employer]  Tobacco Use  . Smoking status: Never Smoker  . Smokeless tobacco: Never Used  Substance and Sexual Activity  . Alcohol use: Yes    Alcohol/week: 0.0 oz    Comment: 2-3 beers daily  . Drug use: No  . Sexual activity: None  Other Topics Concern  . None  Social History Narrative  . None    Outpatient Encounter Medications as of 01/06/2017  Medication Sig  . aspirin EC 81 MG tablet Take 81 mg by  mouth daily.  . fluconazole (DIFLUCAN) 200 MG tablet   . losartan (COZAAR) 100 MG tablet Take 1 tablet (100 mg total) by mouth daily.  . rosuvastatin (CRESTOR) 10 MG tablet Take 1 tablet (10 mg total) by mouth daily.  . pantoprazole (PROTONIX) 40 MG tablet Take 1 tablet (40 mg total) by mouth daily.   No facility-administered encounter medications on file as of 01/06/2017.     Review of Systems  Constitutional: Negative for appetite change and unexpected weight change.  HENT: Negative for congestion and sinus pressure.   Respiratory: Negative for cough, chest tightness and shortness of breath.   Cardiovascular: Negative for chest pain, palpitations and leg swelling.  Gastrointestinal: Negative for abdominal pain, diarrhea, nausea and vomiting.       Acid reflux as outlined.    Genitourinary: Negative for difficulty urinating and dysuria.  Musculoskeletal: Negative for back pain and joint swelling.  Skin: Negative for color change and rash.  Neurological: Negative for dizziness, light-headedness and headaches.  Psychiatric/Behavioral: Negative for agitation and dysphoric mood.       Objective:     Blood pressure rechecked by me: 124/70  Physical Exam  Constitutional: He appears well-developed and well-nourished. No distress.  HENT:  Nose: Nose normal.  Mouth/Throat: Oropharynx is clear and moist.  Neck: Neck supple. No thyromegaly present.  Cardiovascular: Normal rate and regular rhythm.  Pulmonary/Chest: Effort normal and breath sounds normal. No respiratory distress.  Abdominal: Soft. Bowel sounds are normal. There is no tenderness.  Musculoskeletal: He exhibits no edema or tenderness.  Lymphadenopathy:    He has no cervical adenopathy.  Skin: No rash noted. No erythema.  Psychiatric: He has a normal mood and affect. His behavior is normal.    BP 124/70   Pulse 74   Temp 97.8 F (36.6 C)   Resp 18   Ht 5\' 11"  (1.803 m)   Wt 162 lb 8 oz (73.7 kg)   SpO2 98%   BMI  22.66 kg/m  Wt Readings from Last 3 Encounters:  01/06/17 162 lb 8 oz (73.7 kg)  09/29/16 160 lb 12.8 oz (72.9 kg)  08/19/16 160 lb 6.4 oz (72.8 kg)     Lab Results  Component Value Date   WBC 4.0 01/03/2017   HGB 13.7 01/03/2017   HCT 40.5 01/03/2017   PLT 284.0 01/03/2017   GLUCOSE 101 (H) 01/03/2017   CHOL 183 01/03/2017   TRIG 132.0 01/03/2017   HDL 72.70 01/03/2017   LDLDIRECT 144.2 08/22/2012   LDLCALC 84 01/03/2017   ALT 22 01/03/2017   AST 26 01/03/2017   NA 132 (L) 01/03/2017   K 4.8 01/03/2017   CL 96 01/03/2017   CREATININE 0.84 01/03/2017   BUN 10 01/03/2017   CO2 28 01/03/2017   TSH 1.56 01/03/2017   PSA 0.81 01/03/2017    Dg Chest 1 View  Result Date: 07/09/2015 CLINICAL DATA:  Syncope EXAM: CHEST 1 VIEW COMPARISON:  None. FINDINGS: There is no edema or consolidation. Heart size and pulmonary vascularity are normal. No adenopathy. No bone lesions. IMPRESSION: No edema or consolidation. Electronically Signed   By: Bretta BangWilliam  Woodruff III M.D.   On: 07/09/2015 12:26       Assessment & Plan:   Problem List Items Addressed This Visit    Essential (primary) hypertension    Blood pressure under good control.  Continue same medication regimen.  Follow pressures.  Follow metabolic panel.        GERD (gastroesophageal reflux disease)    Acid reflux as outlined.  No dysphagia.  Decrease/stop ibuprofen.  Use tylenol if needed.  Discussed not eating late.  Also start protonix daily as instructed.  Follow.  Get him back in soon to reassess.        Relevant Medications   pantoprazole (PROTONIX) 40 MG tablet   Pure hypercholesterolemia    Low cholesterol diet and exercise.  Follow lipid panel and liver function tests.  On crestor.       Stress    Discussed with him today.  Handling stress.  Overall doing better.  Follow.       Vitamin D deficiency, unspecified    Follow vitamin D level.            Dale DurhamSCOTT, Manveer Gomes, MD

## 2017-01-07 ENCOUNTER — Other Ambulatory Visit: Payer: Self-pay | Admitting: Internal Medicine

## 2017-01-09 ENCOUNTER — Encounter: Payer: Self-pay | Admitting: Internal Medicine

## 2017-01-09 DIAGNOSIS — K219 Gastro-esophageal reflux disease without esophagitis: Secondary | ICD-10-CM | POA: Insufficient documentation

## 2017-01-09 NOTE — Assessment & Plan Note (Signed)
Follow vitamin D level.  

## 2017-01-09 NOTE — Assessment & Plan Note (Addendum)
Low cholesterol diet and exercise.  Follow lipid panel and liver function tests.  On crestor.   

## 2017-01-09 NOTE — Assessment & Plan Note (Signed)
Discussed with him today.  Handling stress.  Overall doing better.  Follow.

## 2017-01-09 NOTE — Assessment & Plan Note (Signed)
Acid reflux as outlined.  No dysphagia.  Decrease/stop ibuprofen.  Use tylenol if needed.  Discussed not eating late.  Also start protonix daily as instructed.  Follow.  Get him back in soon to reassess.

## 2017-01-09 NOTE — Assessment & Plan Note (Signed)
Blood pressure under good control.  Continue same medication regimen.  Follow pressures.  Follow metabolic panel.   

## 2017-02-17 ENCOUNTER — Other Ambulatory Visit: Payer: Self-pay | Admitting: Internal Medicine

## 2017-04-04 ENCOUNTER — Ambulatory Visit (INDEPENDENT_AMBULATORY_CARE_PROVIDER_SITE_OTHER): Payer: BLUE CROSS/BLUE SHIELD | Admitting: Internal Medicine

## 2017-04-04 ENCOUNTER — Encounter: Payer: Self-pay | Admitting: Internal Medicine

## 2017-04-04 DIAGNOSIS — K219 Gastro-esophageal reflux disease without esophagitis: Secondary | ICD-10-CM | POA: Diagnosis not present

## 2017-04-04 DIAGNOSIS — I10 Essential (primary) hypertension: Secondary | ICD-10-CM | POA: Diagnosis not present

## 2017-04-04 DIAGNOSIS — E7800 Pure hypercholesterolemia, unspecified: Secondary | ICD-10-CM | POA: Diagnosis not present

## 2017-04-04 MED ORDER — PANTOPRAZOLE SODIUM 40 MG PO TBEC: 40.0000 mg | DELAYED_RELEASE_TABLET | Freq: Every day | ORAL | 0 refills | Status: DC

## 2017-04-04 NOTE — Progress Notes (Signed)
Patient ID: Scott Cohen, male   DOB: 19-Nov-1950, 67 y.o.   MRN: [id number]   Subjective:    Patient ID: Scott Cohen, male    DOB: 19-Nov-1950, 67 y.o.   MRN: [id number]  HPI  Patient here for a scheduled follow up.  He reports he is doing well.  Feels good.  Stays active.  Not exercising regularly.  Plans to exercise more.  No chest pain.  No sob.  No acid reflux.  No abdominal pain.  Bowels moving.  No urine change.  Blood pressures have been doing well.  On protonix.  No acid reflux now.  States had one flare.  This occurred when he ate spicy food.     Past Medical History:  Diagnosis Date  . Hypercholesterolemia   . Hypertension   . Leukopenia    benign cyclical   Past Surgical History:  Procedure Laterality Date  . WISDOM TOOTH EXTRACTION     Family History  Problem Relation Age of Onset  . Heart disease Mother        pacemaker  . Diabetes Mother   . Heart disease Father        myocardial infarction  . Hypertension Father   . Diabetes Father   . CVA Father   . Colon cancer Neg Hx   . Prostate cancer Neg Hx    Social History   Socioeconomic History  . Marital status: Married    Spouse name: None  . Number of children: None  . Years of education: None  . Highest education level: None  Social Needs  . Financial resource strain: None  . Food insecurity - worry: None  . Food insecurity - inability: None  . Transportation needs - medical: None  . Transportation needs - non-medical: None  Occupational History    Employer: [employer]  Tobacco Use  . Smoking status: Never Smoker  . Smokeless tobacco: Never Used  Substance and Sexual Activity  . Alcohol use: Yes    Alcohol/week: 0.0 oz    Comment: 2-3 beers daily  . Drug use: No  . Sexual activity: None  Other Topics Concern  . None  Social History Narrative  . None    Outpatient Encounter Medications as of 04/04/2017  Medication Sig  . aspirin EC 81 MG tablet Take 81 mg by mouth daily.  . fluconazole  (DIFLUCAN) 200 MG tablet   . losartan (COZAAR) 100 MG tablet TAKE 1 TABLET BY MOUTH EVERY DAY  . pantoprazole (PROTONIX) 40 MG tablet Take 1 tablet (40 mg total) by mouth daily.  . rosuvastatin (CRESTOR) 10 MG tablet TAKE ONE TABLET BY MOUTH ONCE DAILY  . [DISCONTINUED] pantoprazole (PROTONIX) 40 MG tablet Take 1 tablet (40 mg total) by mouth daily.   No facility-administered encounter medications on file as of 04/04/2017.     Review of Systems  Constitutional: Negative for appetite change and unexpected weight change.  HENT: Negative for congestion and sinus pressure.   Respiratory: Negative for cough, chest tightness and shortness of breath.   Cardiovascular: Negative for chest pain, palpitations and leg swelling.  Gastrointestinal: Negative for abdominal pain, diarrhea, nausea and vomiting.  Genitourinary: Negative for difficulty urinating and dysuria.  Musculoskeletal: Negative for joint swelling and myalgias.  Skin: Negative for color change and rash.  Neurological: Negative for dizziness, light-headedness and headaches.  Psychiatric/Behavioral: Negative for agitation and dysphoric mood.       Objective:     Blood pressure rechecked by me:  122/78  Physical Exam  Constitutional: He appears well-developed and well-nourished. No distress.  HENT:  Nose: Nose normal.  Mouth/Throat: Oropharynx is clear and moist.  Neck: Neck supple. No thyromegaly present.  Cardiovascular: Normal rate and regular rhythm.  Pulmonary/Chest: Effort normal and breath sounds normal. No respiratory distress.  Abdominal: Soft. Bowel sounds are normal. There is no tenderness.  Musculoskeletal: He exhibits no edema or tenderness.  Lymphadenopathy:    He has no cervical adenopathy.  Skin: No rash noted. No erythema.  Psychiatric: He has a normal mood and affect. His behavior is normal.    BP 122/78   Pulse 74   Temp 98.3 F (36.8 C) (Oral)   Resp 16   Wt 162 lb (73.5 kg)   SpO2 98%   BMI 22.59  kg/m  Wt Readings from Last 3 Encounters:  04/04/17 162 lb (73.5 kg)  01/06/17 162 lb 8 oz (73.7 kg)  09/29/16 160 lb 12.8 oz (72.9 kg)     Lab Results  Component Value Date   WBC 4.0 01/03/2017   HGB 13.7 01/03/2017   HCT 40.5 01/03/2017   PLT 284.0 01/03/2017   GLUCOSE 101 (H) 01/03/2017   CHOL 183 01/03/2017   TRIG 132.0 01/03/2017   HDL 72.70 01/03/2017   LDLDIRECT 144.2 08/22/2012   LDLCALC 84 01/03/2017   ALT 22 01/03/2017   AST 26 01/03/2017   NA 132 (L) 01/03/2017   K 4.8 01/03/2017   CL 96 01/03/2017   CREATININE 0.84 01/03/2017   BUN 10 01/03/2017   CO2 28 01/03/2017   TSH 1.56 01/03/2017   PSA 0.81 01/03/2017    Dg Chest 1 View  Result Date: 07/09/2015 CLINICAL DATA:  Syncope EXAM: CHEST 1 VIEW COMPARISON:  None. FINDINGS: There is no edema or consolidation. Heart size and pulmonary vascularity are normal. No adenopathy. No bone lesions. IMPRESSION: No edema or consolidation. Electronically Signed   By: Bretta Bang III M.D.   On: 07/09/2015 12:26       Assessment & Plan:   Problem List Items Addressed This Visit    Essential (primary) hypertension    Blood pressure under good control.  Continue same medication regimen.  Follow pressures.  Follow metabolic panel.        Relevant Orders   Basic metabolic panel   GERD (gastroesophageal reflux disease)    Controlled on protonix.  Follow.        Relevant Medications   pantoprazole (PROTONIX) 40 MG tablet   Pure hypercholesterolemia    On crestor.  Low cholesterol diet and exercise.  Follow lipid panel and liver function tests.        Relevant Orders   Hepatic function panel   Lipid panel       Dale [place], MD

## 2017-04-07 ENCOUNTER — Encounter: Payer: Self-pay | Admitting: Internal Medicine

## 2017-04-07 NOTE — Assessment & Plan Note (Signed)
On crestor.  Low cholesterol diet and exercise.  Follow lipid panel and liver function tests.   

## 2017-04-07 NOTE — Assessment & Plan Note (Signed)
Blood pressure under good control.  Continue same medication regimen.  Follow pressures.  Follow metabolic panel.   

## 2017-04-07 NOTE — Assessment & Plan Note (Signed)
Controlled on protonix.  Follow.   

## 2017-04-11 ENCOUNTER — Ambulatory Visit: Payer: BLUE CROSS/BLUE SHIELD | Admitting: Internal Medicine

## 2017-05-25 ENCOUNTER — Other Ambulatory Visit: Payer: Self-pay | Admitting: Internal Medicine

## 2017-05-25 MED ORDER — LOSARTAN POTASSIUM 100 MG PO TABS: 100.0000 mg | ORAL_TABLET | Freq: Every day | ORAL | 0 refills | Status: DC

## 2017-05-25 NOTE — Telephone Encounter (Signed)
Refill for losartan 100 mg  Last refill  02/20/17 for #90tabs  LOV  04/04/17 NOV  09/04/17  Provider  [facility]  [facility] [phone]

## 2017-05-25 NOTE — Telephone Encounter (Signed)
Copied from CRM [phone] [id number]. Topic: Quick Communication - Rx Refill/Question >> May 25, 2017  9:22 AM Oneal GroutSebastian, Jennifer S wrote: Medication: losartan (COZAAR) 100 MG tablet  Has the patient contacted their pharmacy? Yes.   (Agent: If no, request that the patient contact the pharmacy for the refill.) Preferred Pharmacy (with phone number or street name): [facility] Agent: Please be advised that RX refills may take up to 3 business days. We ask that you follow-up with your pharmacy.

## 2017-05-31 DIAGNOSIS — B351 Tinea unguium: Secondary | ICD-10-CM | POA: Diagnosis not present

## 2017-05-31 DIAGNOSIS — D225 Melanocytic nevi of trunk: Secondary | ICD-10-CM | POA: Diagnosis not present

## 2017-07-05 ENCOUNTER — Other Ambulatory Visit (INDEPENDENT_AMBULATORY_CARE_PROVIDER_SITE_OTHER): Payer: BLUE CROSS/BLUE SHIELD

## 2017-07-05 DIAGNOSIS — I10 Essential (primary) hypertension: Secondary | ICD-10-CM | POA: Diagnosis not present

## 2017-07-05 DIAGNOSIS — E7800 Pure hypercholesterolemia, unspecified: Secondary | ICD-10-CM

## 2017-07-05 LAB — HEPATIC FUNCTION PANEL
ALBUMIN: 4.2 g/dL (ref 3.5–5.2)
ALT: 15 U/L (ref 0–53)
AST: 16 U/L (ref 0–37)
Alkaline Phosphatase: 56 U/L (ref 39–117)
Bilirubin, Direct: 0.1 mg/dL (ref 0.0–0.3)
TOTAL PROTEIN: 6.6 g/dL (ref 6.0–8.3)
Total Bilirubin: 0.4 mg/dL (ref 0.2–1.2)

## 2017-07-05 LAB — BASIC METABOLIC PANEL
BUN: 9 mg/dL (ref 6–23)
CHLORIDE: 102 meq/L (ref 96–112)
CO2: 29 meq/L (ref 19–32)
Calcium: 9.2 mg/dL (ref 8.4–10.5)
Creatinine, Ser: 0.86 mg/dL (ref 0.40–1.50)
GFR: 94.33 mL/min (ref >60.00)
GLUCOSE: 95 mg/dL (ref 70–99)
Potassium: 4.5 mEq/L (ref 3.5–5.1)
SODIUM: 137 meq/L (ref 135–145)

## 2017-07-05 LAB — LIPID PANEL
Cholesterol: 145 mg/dL (ref 0–200)
HDL: 59.8 mg/dL (ref >39.00)
LDL CALC: 73 mg/dL (ref 0–99)
NONHDL: 84.95
TRIGLYCERIDES: 59 mg/dL (ref 0.0–149.0)
Total CHOL/HDL Ratio: 2
VLDL: 11.8 mg/dL (ref 0.0–40.0)

## 2017-07-06 ENCOUNTER — Encounter: Payer: Self-pay | Admitting: Internal Medicine

## 2017-08-23 ENCOUNTER — Other Ambulatory Visit: Payer: Self-pay | Admitting: Internal Medicine

## 2017-09-04 ENCOUNTER — Encounter (INDEPENDENT_AMBULATORY_CARE_PROVIDER_SITE_OTHER): Payer: Self-pay

## 2017-09-04 ENCOUNTER — Encounter: Payer: Self-pay | Admitting: Internal Medicine

## 2017-09-04 ENCOUNTER — Ambulatory Visit (INDEPENDENT_AMBULATORY_CARE_PROVIDER_SITE_OTHER): Payer: BLUE CROSS/BLUE SHIELD | Admitting: Internal Medicine

## 2017-09-04 VITALS — BP 130/78 | HR 77 | Temp 97.8°F | Resp 18 | Ht 71.0 in | Wt 154.0 lb

## 2017-09-04 DIAGNOSIS — K219 Gastro-esophageal reflux disease without esophagitis: Secondary | ICD-10-CM

## 2017-09-04 DIAGNOSIS — Z0000 Encounter for general adult medical examination without abnormal findings: Secondary | ICD-10-CM

## 2017-09-04 DIAGNOSIS — E559 Vitamin D deficiency, unspecified: Secondary | ICD-10-CM

## 2017-09-04 DIAGNOSIS — Z87898 Personal history of other specified conditions: Secondary | ICD-10-CM

## 2017-09-04 DIAGNOSIS — R55 Syncope and collapse: Secondary | ICD-10-CM

## 2017-09-04 DIAGNOSIS — I10 Essential (primary) hypertension: Secondary | ICD-10-CM | POA: Diagnosis not present

## 2017-09-04 DIAGNOSIS — F439 Reaction to severe stress, unspecified: Secondary | ICD-10-CM

## 2017-09-04 DIAGNOSIS — E7800 Pure hypercholesterolemia, unspecified: Secondary | ICD-10-CM

## 2017-09-04 LAB — BASIC METABOLIC PANEL
BUN: 12 mg/dL (ref 6–23)
CHLORIDE: 101 meq/L (ref 96–112)
CO2: 27 mEq/L (ref 19–32)
CREATININE: 0.89 mg/dL (ref 0.40–1.50)
Calcium: 9.6 mg/dL (ref 8.4–10.5)
GFR: 90.62 mL/min (ref >60.00)
Glucose, Bld: 102 mg/dL — ABNORMAL HIGH (ref 70–99)
Potassium: 4.1 mEq/L (ref 3.5–5.1)
Sodium: 135 mEq/L (ref 135–145)

## 2017-09-04 LAB — TSH: TSH: 1.6 u[IU]/mL (ref 0.35–4.50)

## 2017-09-04 LAB — CBC WITH DIFFERENTIAL/PLATELET
Basophils Absolute: 0 10*3/uL (ref 0.0–0.1)
Basophils Relative: 0.7 % (ref 0.0–3.0)
EOS PCT: 1 % (ref 0.0–5.0)
Eosinophils Absolute: 0 10*3/uL (ref 0.0–0.7)
HCT: 40 % (ref 39.0–52.0)
HEMOGLOBIN: 14.2 g/dL (ref 13.0–17.0)
Lymphocytes Relative: 41.6 % (ref 12.0–46.0)
Lymphs Abs: 1.6 10*3/uL (ref 0.7–4.0)
MCHC: 35.5 g/dL (ref 30.0–36.0)
MCV: 87.3 fl (ref 78.0–100.0)
MONOS PCT: 11 % (ref 3.0–12.0)
Monocytes Absolute: 0.4 10*3/uL (ref 0.1–1.0)
Neutro Abs: 1.8 10*3/uL (ref 1.4–7.7)
Neutrophils Relative %: 45.7 % (ref 43.0–77.0)
Platelets: 219 10*3/uL (ref 150.0–400.0)
RBC: 4.58 Mil/uL (ref 4.22–5.81)
RDW: 12.8 % (ref 11.5–15.5)
WBC: 3.9 10*3/uL — AB (ref 4.0–10.5)

## 2017-09-04 NOTE — Assessment & Plan Note (Signed)
Controlled on protonix.   

## 2017-09-04 NOTE — Assessment & Plan Note (Signed)
On crestor.  Low cholesterol diet and exercise.  Follow lipid panel and liver function tests.   

## 2017-09-04 NOTE — Assessment & Plan Note (Signed)
Follow vitamin D level.  

## 2017-09-04 NOTE — Addendum Note (Signed)
Addended by: Charm BargesSCOTT, Myah Guynes S on: 09/04/2017 08:39 PM   Modules accepted: Level of Service

## 2017-09-04 NOTE — Progress Notes (Signed)
Patient ID: Scott Cohen, male   DOB: 11-27-1950, 67 y.o.   MRN: [id number]   Subjective:    Patient ID: Scott Cohen, male    DOB: 1950/11/20, 67 y.o.   MRN: [id number]  HPI  Patient here for his physical exam.  He reports on 07/14/17 - he was in church.  Sitting.  Started feeling funny.  Passed out.  Next thing he remembers is people surrounding him.  Went home.  Rested.  Felt better the next day.  States the day prior, he had worked outside and had not eaten much.  Reports did ok until yesterday.  Was at home, sitting at his desk and noticed the same sensation.  Had not been working outside.  Had eaten.  States he rested on the couch and elevated his legs.  Episode lasted 10-15 minutes.  Blood pressure 136/82 and pulse 70s.  Some fatigue.  Better today.  Had syncopal episode a few years ago.  Saw cardiology then.  Had 48 hour holter.  He is eating.  No nausea or vomiting.  No abdominal pain.  Bowels moving.  No headache.  No dizziness.  Increased stress.  Things are going to get better at work - he anticipates.  Discussed treatment.     Past Medical History:  Diagnosis Date  . Hypercholesterolemia   . Hypertension   . Leukopenia    benign cyclical   Past Surgical History:  Procedure Laterality Date  . WISDOM TOOTH EXTRACTION     Family History  Problem Relation Age of Onset  . Heart disease Mother        pacemaker  . Diabetes Mother   . Heart disease Father        myocardial infarction  . Hypertension Father   . Diabetes Father   . CVA Father   . Colon cancer Neg Hx   . Prostate cancer Neg Hx    Social History   Socioeconomic History  . Marital status: Married    Spouse name: Not on file  . Number of children: Not on file  . Years of education: Not on file  . Highest education level: Not on file  Occupational History    Employer: [employer]  Social Needs  . Financial resource strain: Not on file  . Food insecurity:    Worry: Not on file    Inability: Not on file    . Transportation needs:    Medical: Not on file    Non-medical: Not on file  Tobacco Use  . Smoking status: Never Smoker  . Smokeless tobacco: Never Used  Substance and Sexual Activity  . Alcohol use: Yes    Alcohol/week: 0.0 oz    Comment: 2-3 beers daily  . Drug use: No  . Sexual activity: Not on file  Lifestyle  . Physical activity:    Days per week: Not on file    Minutes per session: Not on file  . Stress: Not on file  Relationships  . Social connections:    Talks on phone: Not on file    Gets together: Not on file    Attends religious service: Not on file    Active member of club or organization: Not on file    Attends meetings of clubs or organizations: Not on file    Relationship status: Not on file  Other Topics Concern  . Not on file  Social History Narrative  . Not on file    Outpatient Encounter Medications as  of 09/04/2017  Medication Sig  . aspirin EC 81 MG tablet Take 81 mg by mouth daily.  . fluconazole (DIFLUCAN) 200 MG tablet   . losartan (COZAAR) 100 MG tablet TAKE 1 TABLET BY MOUTH DAILY. GENERIC EQUIVALENT FOR COZAAR  . pantoprazole (PROTONIX) 40 MG tablet Take 1 tablet (40 mg total) by mouth daily.  . rosuvastatin (CRESTOR) 10 MG tablet TAKE ONE TABLET BY MOUTH ONCE DAILY   No facility-administered encounter medications on file as of 09/04/2017.     Review of Systems  Constitutional: Negative for appetite change and unexpected weight change.  HENT: Negative for congestion and sinus pressure.   Eyes: Negative for pain and visual disturbance.  Respiratory: Negative for cough, chest tightness and shortness of breath.   Cardiovascular: Negative for chest pain, palpitations and leg swelling.  Gastrointestinal: Negative for abdominal pain, diarrhea, nausea and vomiting.  Genitourinary: Negative for difficulty urinating and dysuria.  Musculoskeletal: Negative for joint swelling and myalgias.  Skin: Negative for color change and rash.  Neurological:  Positive for syncope. Negative for dizziness, light-headedness and headaches.  Hematological: Negative for adenopathy. Does not bruise/bleed easily.  Psychiatric/Behavioral: Negative for agitation and dysphoric mood.       Objective:    Physical Exam  Constitutional: He is oriented to person, place, and time. He appears well-developed and well-nourished. No distress.  HENT:  Head: Normocephalic and atraumatic.  Nose: Nose normal.  Mouth/Throat: Oropharynx is clear and moist. No oropharyngeal exudate.  Eyes: Conjunctivae are normal. Right eye exhibits no discharge. Left eye exhibits no discharge.  Neck: Neck supple. No thyromegaly present.  Cardiovascular: Normal rate and regular rhythm.  Pulmonary/Chest: Breath sounds normal. No respiratory distress. He has no wheezes.  Abdominal: Soft. Bowel sounds are normal. There is no tenderness.  Genitourinary:  Genitourinary Comments: Not performed.    Musculoskeletal: He exhibits no edema or tenderness.  Lymphadenopathy:    He has no cervical adenopathy.  Neurological: He is alert and oriented to person, place, and time.  Skin: No rash noted. No erythema.  Psychiatric: He has a normal mood and affect. His behavior is normal.    BP 130/78 (BP Location: Left Arm, Patient Position: Sitting, Cuff Size: Normal)   Pulse 77   Temp 97.8 F (36.6 C) (Oral)   Resp 18   Ht 5\' 11"  (1.803 m)   Wt 154 lb (69.9 kg)   SpO2 98%   BMI 21.48 kg/m  Wt Readings from Last 3 Encounters:  09/04/17 154 lb (69.9 kg)  04/04/17 162 lb (73.5 kg)  01/06/17 162 lb 8 oz (73.7 kg)     Lab Results  Component Value Date   WBC 3.9 (L) 09/04/2017   HGB 14.2 09/04/2017   HCT 40.0 09/04/2017   PLT 219.0 09/04/2017   GLUCOSE 102 (H) 09/04/2017   CHOL 145 07/05/2017   TRIG 59.0 07/05/2017   HDL 59.80 07/05/2017   LDLDIRECT 144.2 08/22/2012   LDLCALC 73 07/05/2017   ALT 15 07/05/2017   AST 16 07/05/2017   NA 135 09/04/2017   K 4.1 09/04/2017   CL 101  09/04/2017   CREATININE 0.89 09/04/2017   BUN 12 09/04/2017   CO2 27 09/04/2017   TSH 1.60 09/04/2017   PSA 0.81 01/03/2017    Dg Chest 1 View  Result Date: 07/09/2015 CLINICAL DATA:  Syncope EXAM: CHEST 1 VIEW COMPARISON:  None. FINDINGS: There is no edema or consolidation. Heart size and pulmonary vascularity are normal. No adenopathy. No bone lesions. IMPRESSION:  No edema or consolidation. Electronically Signed   By: Bretta BangWilliam  Woodruff III M.D.   On: 07/09/2015 12:26       Assessment & Plan:   Problem List Items Addressed This Visit    Essential (primary) hypertension    Blood pressure under good control.  Continue same medication regimen.  Follow pressures.  Follow metabolic panel.        Relevant Orders   Basic metabolic panel   GERD (gastroesophageal reflux disease)    Controlled on protonix.        H/O syncope    Previous syncope.  Occurred a few years ago.  Saw cardiology then. Had 48 hour holter.  Unrevealing.  Had two episodes recently.  Last yesterday.  Was sitting when occurred.  No known triggers.  Will have cardiology reevaluate.  Will check cbc and electrolytes.   Has sensation prior to syncope.  Blood pressure ok.  Will have neurology evaluate as well.        Pure hypercholesterolemia    On crestor.  Low cholesterol diet and exercise.  Follow lipid panel and liver function tests.        Relevant Orders   Hepatic function panel   Lipid panel   Stress    Increased stress as outlined.  Feels things will get better next week.  Hold on medication.  Follow.        Vitamin D deficiency, unspecified    Follow vitamin D level.         Other Visit Diagnoses    Syncope, unspecified syncope type    -  Primary   Relevant Orders   EKG 12-Lead (Completed)   Ambulatory referral to Cardiology   Ambulatory referral to Neurology   CBC with Differential/Platelet (Completed)   TSH (Completed)   Basic metabolic panel (Completed)       Dale DurhamSCOTT, Sydnie Sigmund, MD

## 2017-09-04 NOTE — Assessment & Plan Note (Signed)
Blood pressure under good control.  Continue same medication regimen.  Follow pressures.  Follow metabolic panel.   

## 2017-09-04 NOTE — Assessment & Plan Note (Signed)
Increased stress as outlined.  Feels things will get better next week.  Hold on medication.  Follow.

## 2017-09-04 NOTE — Assessment & Plan Note (Signed)
Previous syncope.  Occurred a few years ago.  Saw cardiology then. Had 48 hour holter.  Unrevealing.  Had two episodes recently.  Last yesterday.  Was sitting when occurred.  No known triggers.  Will have cardiology reevaluate.  Will check cbc and electrolytes.   Has sensation prior to syncope.  Blood pressure ok.  Will have neurology evaluate as well.

## 2017-09-05 ENCOUNTER — Encounter: Payer: Self-pay | Admitting: Internal Medicine

## 2017-09-05 ENCOUNTER — Other Ambulatory Visit: Payer: Self-pay | Admitting: Internal Medicine

## 2017-09-05 ENCOUNTER — Telehealth: Payer: Self-pay | Admitting: Internal Medicine

## 2017-09-05 DIAGNOSIS — I10 Essential (primary) hypertension: Secondary | ICD-10-CM | POA: Diagnosis not present

## 2017-09-05 DIAGNOSIS — Z87898 Personal history of other specified conditions: Secondary | ICD-10-CM | POA: Diagnosis not present

## 2017-09-05 DIAGNOSIS — E7800 Pure hypercholesterolemia, unspecified: Secondary | ICD-10-CM | POA: Diagnosis not present

## 2017-09-05 MED ORDER — PANTOPRAZOLE SODIUM 40 MG PO TBEC: 40.0000 mg | DELAYED_RELEASE_TABLET | Freq: Every day | ORAL | 1 refills | Status: DC

## 2017-09-05 NOTE — Telephone Encounter (Signed)
Resent EKG faxed to kim at the number listed at [facility]

## 2017-09-05 NOTE — Telephone Encounter (Signed)
Copied from CRM [id number]. Topic: Quick Communication - See Telephone Encounter >> Sep 05, 2017 11:18 AM Lorrine KinMcGee, Dmauri Rosenow B, NT wrote: CRM for notification. See Telephone encounter for: 09/05/17. Kim with Cardiology at the [facility] calling and states that she is unable to access the patient's EKG from yesterday. Would like this faxed over to them due to the patient being there for an apointment.  Fax#: [phone]

## 2017-09-05 NOTE — Progress Notes (Signed)
rx sent in for protonix #90 with one refill.  Sent to mail order.

## 2017-09-19 DIAGNOSIS — R55 Syncope and collapse: Secondary | ICD-10-CM | POA: Diagnosis not present

## 2017-09-19 DIAGNOSIS — Z87898 Personal history of other specified conditions: Secondary | ICD-10-CM | POA: Diagnosis not present

## 2017-09-28 DIAGNOSIS — R55 Syncope and collapse: Secondary | ICD-10-CM | POA: Diagnosis not present

## 2017-10-12 DIAGNOSIS — I6523 Occlusion and stenosis of bilateral carotid arteries: Secondary | ICD-10-CM | POA: Diagnosis not present

## 2017-10-12 DIAGNOSIS — R55 Syncope and collapse: Secondary | ICD-10-CM | POA: Diagnosis not present

## 2017-10-24 ENCOUNTER — Other Ambulatory Visit (INDEPENDENT_AMBULATORY_CARE_PROVIDER_SITE_OTHER): Payer: BLUE CROSS/BLUE SHIELD

## 2017-10-24 DIAGNOSIS — E7800 Pure hypercholesterolemia, unspecified: Secondary | ICD-10-CM

## 2017-10-24 DIAGNOSIS — I10 Essential (primary) hypertension: Secondary | ICD-10-CM

## 2017-10-24 LAB — BASIC METABOLIC PANEL
BUN: 14 mg/dL (ref 6–23)
CALCIUM: 9.3 mg/dL (ref 8.4–10.5)
CHLORIDE: 100 meq/L (ref 96–112)
CO2: 29 meq/L (ref 19–32)
Creatinine, Ser: 0.88 mg/dL (ref 0.40–1.50)
GFR: 91.78 mL/min (ref >60.00)
Glucose, Bld: 90 mg/dL (ref 70–99)
POTASSIUM: 4.9 meq/L (ref 3.5–5.1)
SODIUM: 135 meq/L (ref 135–145)

## 2017-10-24 LAB — HEPATIC FUNCTION PANEL
ALBUMIN: 4.4 g/dL (ref 3.5–5.2)
ALK PHOS: 53 U/L (ref 39–117)
ALT: 16 U/L (ref 0–53)
AST: 18 U/L (ref 0–37)
Bilirubin, Direct: 0.1 mg/dL (ref 0.0–0.3)
TOTAL PROTEIN: 6.8 g/dL (ref 6.0–8.3)
Total Bilirubin: 0.5 mg/dL (ref 0.2–1.2)

## 2017-10-24 LAB — LIPID PANEL
Cholesterol: 172 mg/dL (ref 0–200)
HDL: 67.1 mg/dL (ref >39.00)
LDL Cholesterol: 75 mg/dL (ref 0–99)
NONHDL: 104.9
TRIGLYCERIDES: 149 mg/dL (ref 0.0–149.0)
Total CHOL/HDL Ratio: 3
VLDL: 29.8 mg/dL (ref 0.0–40.0)

## 2017-10-25 ENCOUNTER — Encounter: Payer: Self-pay | Admitting: Internal Medicine

## 2017-10-26 ENCOUNTER — Other Ambulatory Visit: Payer: BLUE CROSS/BLUE SHIELD

## 2017-10-30 ENCOUNTER — Encounter: Payer: Self-pay | Admitting: Internal Medicine

## 2017-10-30 ENCOUNTER — Ambulatory Visit (INDEPENDENT_AMBULATORY_CARE_PROVIDER_SITE_OTHER): Payer: BLUE CROSS/BLUE SHIELD | Admitting: Internal Medicine

## 2017-10-30 DIAGNOSIS — E7800 Pure hypercholesterolemia, unspecified: Secondary | ICD-10-CM

## 2017-10-30 DIAGNOSIS — Z87898 Personal history of other specified conditions: Secondary | ICD-10-CM

## 2017-10-30 DIAGNOSIS — D72819 Decreased white blood cell count, unspecified: Secondary | ICD-10-CM

## 2017-10-30 DIAGNOSIS — I10 Essential (primary) hypertension: Secondary | ICD-10-CM | POA: Diagnosis not present

## 2017-10-30 DIAGNOSIS — E559 Vitamin D deficiency, unspecified: Secondary | ICD-10-CM

## 2017-10-30 DIAGNOSIS — K219 Gastro-esophageal reflux disease without esophagitis: Secondary | ICD-10-CM | POA: Diagnosis not present

## 2017-10-30 DIAGNOSIS — F439 Reaction to severe stress, unspecified: Secondary | ICD-10-CM

## 2017-10-30 NOTE — Progress Notes (Signed)
Patient ID: Scott Cohen, male   DOB: Feb 10, 1950, 68 y.o.   MRN: [id number]   Subjective:    Patient ID: Scott Cohen, male    DOB: April 10, 1950, 66 y.o.   MRN: [id number]  HPI  Patient here for a scheduled follow up.  He reports he is doing well.  Saw neurology (Dr Malvin Johns) for f/u of near syncope.  Note reviewed.  Carotid ultrasound - wnl.   Saw cardiology.  Had echo - normal function with mild LVH and EF 55%, mild TR,AR and trivial MR.  Due to f/u with Dr Lady Gary next week.  Planning to discuss possible event monitor, etc.  We discussed wearing the monitor.  No chest pain.  No sob.  No acid reflux.  No abdominal pain.  Bowels moving.  Blood pressures doing well.  Handling stress.  Feels doing better.     Past Medical History:  Diagnosis Date  . Hypercholesterolemia   . Hypertension   . Leukopenia    benign cyclical   Past Surgical History:  Procedure Laterality Date  . WISDOM TOOTH EXTRACTION     Family History  Problem Relation Age of Onset  . Heart disease Mother        pacemaker  . Diabetes Mother   . Heart disease Father        myocardial infarction  . Hypertension Father   . Diabetes Father   . CVA Father   . Colon cancer Neg Hx   . Prostate cancer Neg Hx    Social History   Socioeconomic History  . Marital status: Married    Spouse name: Not on file  . Number of children: Not on file  . Years of education: Not on file  . Highest education level: Not on file  Occupational History    Employer: [employer]  Social Needs  . Financial resource strain: Not on file  . Food insecurity:    Worry: Not on file    Inability: Not on file  . Transportation needs:    Medical: Not on file    Non-medical: Not on file  Tobacco Use  . Smoking status: Never Smoker  . Smokeless tobacco: Never Used  Substance and Sexual Activity  . Alcohol use: Yes    Alcohol/week: 0.0 standard drinks    Comment: 2-3 beers daily  . Drug use: No  . Sexual activity: Not on file  Lifestyle    . Physical activity:    Days per week: Not on file    Minutes per session: Not on file  . Stress: Not on file  Relationships  . Social connections:    Talks on phone: Not on file    Gets together: Not on file    Attends religious service: Not on file    Active member of club or organization: Not on file    Attends meetings of clubs or organizations: Not on file    Relationship status: Not on file  Other Topics Concern  . Not on file  Social History Narrative  . Not on file    Outpatient Encounter Medications as of 10/30/2017  Medication Sig  . aspirin EC 81 MG tablet Take 81 mg by mouth daily.  . fluconazole (DIFLUCAN) 200 MG tablet   . losartan (COZAAR) 100 MG tablet TAKE 1 TABLET BY MOUTH DAILY. GENERIC EQUIVALENT FOR COZAAR  . pantoprazole (PROTONIX) 40 MG tablet Take 1 tablet (40 mg total) by mouth daily.  . rosuvastatin (CRESTOR) 10 MG tablet  TAKE ONE TABLET BY MOUTH ONCE DAILY   No facility-administered encounter medications on file as of 10/30/2017.     Review of Systems  Constitutional: Negative for appetite change and unexpected weight change.  HENT: Negative for congestion and sinus pressure.   Respiratory: Negative for cough, chest tightness and shortness of breath.   Cardiovascular: Negative for chest pain, palpitations and leg swelling.  Gastrointestinal: Negative for abdominal pain, diarrhea, nausea and vomiting.  Genitourinary: Negative for difficulty urinating and dysuria.  Musculoskeletal: Negative for joint swelling and myalgias.  Skin: Negative for color change and rash.  Neurological: Negative for dizziness, light-headedness and headaches.  Psychiatric/Behavioral: Negative for agitation and dysphoric mood.       Objective:    Physical Exam  Constitutional: He appears well-developed and well-nourished. No distress.  HENT:  Nose: Nose normal.  Mouth/Throat: Oropharynx is clear and moist.  Neck: Neck supple. No thyromegaly present.  Cardiovascular:  Normal rate and regular rhythm.  Pulmonary/Chest: Effort normal and breath sounds normal. No respiratory distress.  Abdominal: Soft. Bowel sounds are normal. There is no tenderness.  Musculoskeletal: He exhibits no edema or tenderness.  Lymphadenopathy:    He has no cervical adenopathy.  Skin: No rash noted. No erythema.  Psychiatric: He has a normal mood and affect. His behavior is normal.    BP 126/70 (BP Location: Left Arm, Patient Position: Sitting, Cuff Size: Normal)   Pulse 74   Temp (!) 97.5 F (36.4 C) (Oral)   Resp 18   Wt 158 lb (71.7 kg)   SpO2 98%   BMI 22.04 kg/m  Wt Readings from Last 3 Encounters:  10/30/17 158 lb (71.7 kg)  09/04/17 154 lb (69.9 kg)  04/04/17 162 lb (73.5 kg)     Lab Results  Component Value Date   WBC 3.9 (L) 09/04/2017   HGB 14.2 09/04/2017   HCT 40.0 09/04/2017   PLT 219.0 09/04/2017   GLUCOSE 90 10/24/2017   CHOL 172 10/24/2017   TRIG 149.0 10/24/2017   HDL 67.10 10/24/2017   LDLDIRECT 144.2 08/22/2012   LDLCALC 75 10/24/2017   ALT 16 10/24/2017   AST 18 10/24/2017   NA 135 10/24/2017   K 4.9 10/24/2017   CL 100 10/24/2017   CREATININE 0.88 10/24/2017   BUN 14 10/24/2017   CO2 29 10/24/2017   TSH 1.60 09/04/2017   PSA 0.81 01/03/2017    Dg Chest 1 View  Result Date: 07/09/2015 CLINICAL DATA:  Syncope EXAM: CHEST 1 VIEW COMPARISON:  None. FINDINGS: There is no edema or consolidation. Heart size and pulmonary vascularity are normal. No adenopathy. No bone lesions. IMPRESSION: No edema or consolidation. Electronically Signed   By: Bretta Bang III M.D.   On: 07/09/2015 12:26       Assessment & Plan:   Problem List Items Addressed This Visit    Decreased white blood cell count    Follow cbc.        Essential (primary) hypertension    Blood pressure under good control.  Continue same medication regimen.  Follow pressures.  Follow metabolic panel.        GERD (gastroesophageal reflux disease)    Controlled on  protonix.        H/O syncope    Near syncope as outlined.  Saw neurology.  Questioning possible vasovagal episodes.  Seeing cardiology.  Discussed monitor.  Plans to discuss with  Cardiology.        Pure hypercholesterolemia    On crestor.  Low cholesterol diet and exercise.  Follow lipid panel and liver function tests.        Stress    Increased stress.  Discussed with him today. Doing better. Does not feel need any further intervention.  Follow.       Vitamin D deficiency, unspecified    Follow vitamin D level.            Dale Villisca, MD

## 2017-11-05 ENCOUNTER — Encounter: Payer: Self-pay | Admitting: Internal Medicine

## 2017-11-05 NOTE — Assessment & Plan Note (Signed)
On crestor.  Low cholesterol diet and exercise.  Follow lipid panel and liver function tests.   

## 2017-11-05 NOTE — Assessment & Plan Note (Signed)
Near syncope as outlined.  Saw neurology.  Questioning possible vasovagal episodes.  Seeing cardiology.  Discussed monitor.  Plans to discuss with  Cardiology.

## 2017-11-05 NOTE — Assessment & Plan Note (Signed)
Follow cbc.  

## 2017-11-05 NOTE — Assessment & Plan Note (Signed)
Controlled on protonix.   

## 2017-11-05 NOTE — Assessment & Plan Note (Signed)
Increased stress.  Discussed with him today. Doing better. Does not feel need any further intervention.  Follow.

## 2017-11-05 NOTE — Assessment & Plan Note (Signed)
Follow vitamin D level.  

## 2017-11-05 NOTE — Assessment & Plan Note (Signed)
Blood pressure under good control.  Continue same medication regimen.  Follow pressures.  Follow metabolic panel.   

## 2017-11-06 DIAGNOSIS — I10 Essential (primary) hypertension: Secondary | ICD-10-CM | POA: Diagnosis not present

## 2017-11-06 DIAGNOSIS — E7800 Pure hypercholesterolemia, unspecified: Secondary | ICD-10-CM | POA: Diagnosis not present

## 2017-11-06 DIAGNOSIS — R55 Syncope and collapse: Secondary | ICD-10-CM | POA: Diagnosis not present

## 2017-11-18 ENCOUNTER — Other Ambulatory Visit: Payer: Self-pay | Admitting: Internal Medicine

## 2017-11-21 DIAGNOSIS — I10 Essential (primary) hypertension: Secondary | ICD-10-CM | POA: Diagnosis not present

## 2017-11-21 DIAGNOSIS — E7800 Pure hypercholesterolemia, unspecified: Secondary | ICD-10-CM | POA: Diagnosis not present

## 2017-11-21 DIAGNOSIS — Z1211 Encounter for screening for malignant neoplasm of colon: Secondary | ICD-10-CM | POA: Diagnosis not present

## 2017-11-21 DIAGNOSIS — Z87898 Personal history of other specified conditions: Secondary | ICD-10-CM | POA: Diagnosis not present

## 2017-12-06 DIAGNOSIS — B351 Tinea unguium: Secondary | ICD-10-CM | POA: Diagnosis not present

## 2017-12-06 DIAGNOSIS — D2261 Melanocytic nevi of right upper limb, including shoulder: Secondary | ICD-10-CM | POA: Diagnosis not present

## 2017-12-06 DIAGNOSIS — D2262 Melanocytic nevi of left upper limb, including shoulder: Secondary | ICD-10-CM | POA: Diagnosis not present

## 2017-12-06 DIAGNOSIS — D225 Melanocytic nevi of trunk: Secondary | ICD-10-CM | POA: Diagnosis not present

## 2017-12-16 ENCOUNTER — Other Ambulatory Visit: Payer: Self-pay | Admitting: Internal Medicine

## 2017-12-18 ENCOUNTER — Telehealth: Payer: Self-pay | Admitting: Internal Medicine

## 2017-12-18 NOTE — Telephone Encounter (Signed)
Pt dropped off a pt form to be filled out. Placed in Dr. Marina GoodellScotts color folder upfront  Please advise pt when completed

## 2017-12-18 NOTE — Telephone Encounter (Signed)
Form signed and placed in box.   

## 2017-12-18 NOTE — Telephone Encounter (Signed)
Placed in quick sign for signature 

## 2017-12-19 NOTE — Telephone Encounter (Signed)
Called patient to let him know that form has been completed. Will place up front for pick up

## 2018-01-05 DIAGNOSIS — Z1211 Encounter for screening for malignant neoplasm of colon: Secondary | ICD-10-CM | POA: Diagnosis not present

## 2018-01-05 DIAGNOSIS — K641 Second degree hemorrhoids: Secondary | ICD-10-CM | POA: Diagnosis not present

## 2018-01-05 LAB — HM COLONOSCOPY

## 2018-03-02 ENCOUNTER — Ambulatory Visit (INDEPENDENT_AMBULATORY_CARE_PROVIDER_SITE_OTHER): Payer: BLUE CROSS/BLUE SHIELD | Admitting: Internal Medicine

## 2018-03-02 ENCOUNTER — Encounter: Payer: Self-pay | Admitting: Internal Medicine

## 2018-03-02 VITALS — BP 128/72 | HR 91 | Temp 97.6°F | Resp 16 | Wt 156.4 lb

## 2018-03-02 DIAGNOSIS — E7800 Pure hypercholesterolemia, unspecified: Secondary | ICD-10-CM

## 2018-03-02 DIAGNOSIS — K219 Gastro-esophageal reflux disease without esophagitis: Secondary | ICD-10-CM

## 2018-03-02 DIAGNOSIS — E559 Vitamin D deficiency, unspecified: Secondary | ICD-10-CM

## 2018-03-02 DIAGNOSIS — Z125 Encounter for screening for malignant neoplasm of prostate: Secondary | ICD-10-CM

## 2018-03-02 DIAGNOSIS — I10 Essential (primary) hypertension: Secondary | ICD-10-CM

## 2018-03-02 DIAGNOSIS — Z87898 Personal history of other specified conditions: Secondary | ICD-10-CM

## 2018-03-02 MED ORDER — AMLODIPINE BESYLATE 2.5 MG PO TABS: 2.5000 mg | ORAL_TABLET | Freq: Every day | ORAL | 1 refills | Status: DC

## 2018-03-02 NOTE — Progress Notes (Signed)
Patient ID: Scott GessDavid A Cohen, male   DOB: 07/04/1950, 68 y.o.   MRN: [id number]   Subjective:    Patient ID: Scott Cohen, male    DOB: 04/25/1950, 68 y.o.   MRN: [id number]  HPI  Patient here for a scheduled follow up.  He reports he is doing well.  Feels good.  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  Doing well.  Has started exercising regularly.  No chest pain.  No sob.  No acid reflux.  No abdominal pain.  Bowels moving.  Blood pressures running a little high.  Reviewed outside blood pressure readings.  Blood pressures averaging 130-140s/80s.  No syncope or near syncope.  No light headedness or dizziness.     Past Medical History:  Diagnosis Date  . Hypercholesterolemia   . Hypertension   . Leukopenia    benign cyclical   Past Surgical History:  Procedure Laterality Date  . WISDOM TOOTH EXTRACTION     Family History  Problem Relation Age of Onset  . Heart disease Mother        pacemaker  . Diabetes Mother   . Heart disease Father        myocardial infarction  . Hypertension Father   . Diabetes Father   . CVA Father   . Colon cancer Neg Hx   . Prostate cancer Neg Hx    Social History   Socioeconomic History  . Marital status: Married    Spouse name: Not on file  . Number of children: Not on file  . Years of education: Not on file  . Highest education level: Not on file  Occupational History    Employer: [employer]  Social Needs  . Financial resource strain: Not on file  . Food insecurity:    Worry: Not on file    Inability: Not on file  . Transportation needs:    Medical: Not on file    Non-medical: Not on file  Tobacco Use  . Smoking status: Never Smoker  . Smokeless tobacco: Never Used  Substance and Sexual Activity  . Alcohol use: Yes    Alcohol/week: 0.0 standard drinks    Comment: 2-3 beers daily  . Drug use: No  . Sexual activity: Not on file  Lifestyle  . Physical activity:    Days per week: Not on file    Minutes per session: Not on  file  . Stress: Not on file  Relationships  . Social connections:    Talks on phone: Not on file    Gets together: Not on file    Attends religious service: Not on file    Active member of club or organization: Not on file    Attends meetings of clubs or organizations: Not on file    Relationship status: Not on file  Other Topics Concern  . Not on file  Social History Narrative  . Not on file    Outpatient Encounter Medications as of 03/02/2018  Medication Sig  . aspirin EC 81 MG tablet Take 81 mg by mouth daily.  Marland Kitchen. losartan (COZAAR) 100 MG tablet TAKE 1 TABLET BY MOUTH DAILY. GENERIC EQUIVALENT FOR COZAAR  . pantoprazole (PROTONIX) 40 MG tablet Take 1 tablet (40 mg total) by mouth daily.  . rosuvastatin (CRESTOR) 10 MG tablet TAKE 1 TABLET BY MOUTH EVERY DAY  . amLODipine (NORVASC) 2.5 MG tablet Take 1 tablet (2.5 mg total) by mouth daily.  . [DISCONTINUED] fluconazole (DIFLUCAN) 200 MG tablet  No facility-administered encounter medications on file as of 03/02/2018.     Review of Systems  Constitutional: Negative for appetite change and unexpected weight change.  HENT: Negative for congestion and sinus pressure.   Respiratory: Negative for cough, chest tightness and shortness of breath.   Cardiovascular: Negative for chest pain, palpitations and leg swelling.  Gastrointestinal: Negative for abdominal pain, diarrhea, nausea and vomiting.  Genitourinary: Negative for difficulty urinating and dysuria.  Musculoskeletal: Negative for joint swelling and myalgias.  Skin: Negative for color change and rash.  Neurological: Negative for dizziness, light-headedness and headaches.  Psychiatric/Behavioral: Negative for agitation and dysphoric mood.       Objective:    Physical Exam Constitutional:      General: He is not in acute distress.    Appearance: Normal appearance. He is well-developed.  HENT:     Nose: Nose normal. No congestion.     Mouth/Throat:     Pharynx: No  oropharyngeal exudate or posterior oropharyngeal erythema.  Cardiovascular:     Rate and Rhythm: Normal rate and regular rhythm.  Pulmonary:     Effort: Pulmonary effort is normal. No respiratory distress.     Breath sounds: Normal breath sounds.  Abdominal:     General: Bowel sounds are normal.     Palpations: Abdomen is soft.     Tenderness: There is no abdominal tenderness.  Musculoskeletal:        General: No swelling or tenderness.  Skin:    Findings: No erythema or rash.  Neurological:     Mental Status: He is alert.  Psychiatric:        Mood and Affect: Mood normal.        Behavior: Behavior normal.     BP 128/72 (BP Location: Left Arm, Patient Position: Sitting, Cuff Size: Normal)   Pulse 91   Temp 97.6 F (36.4 C) (Oral)   Resp 16   Wt 156 lb 6.4 oz (70.9 kg)   SpO2 99%   BMI 21.81 kg/m  Wt Readings from Last 3 Encounters:  03/02/18 156 lb 6.4 oz (70.9 kg)  10/30/17 158 lb (71.7 kg)  09/04/17 154 lb (69.9 kg)     Lab Results  Component Value Date   WBC 3.9 (L) 09/04/2017   HGB 14.2 09/04/2017   HCT 40.0 09/04/2017   PLT 219.0 09/04/2017   GLUCOSE 90 10/24/2017   CHOL 172 10/24/2017   TRIG 149.0 10/24/2017   HDL 67.10 10/24/2017   LDLDIRECT 144.2 08/22/2012   LDLCALC 75 10/24/2017   ALT 16 10/24/2017   AST 18 10/24/2017   NA 135 10/24/2017   K 4.9 10/24/2017   CL 100 10/24/2017   CREATININE 0.88 10/24/2017   BUN 14 10/24/2017   CO2 29 10/24/2017   TSH 1.60 09/04/2017   PSA 0.81 01/03/2017    Dg Chest 1 View  Result Date: 07/09/2015 CLINICAL DATA:  Syncope EXAM: CHEST 1 VIEW COMPARISON:  None. FINDINGS: There is no edema or consolidation. Heart size and pulmonary vascularity are normal. No adenopathy. No bone lesions. IMPRESSION: No edema or consolidation. Electronically Signed   By: Bretta Bang III M.D.   On: 07/09/2015 12:26       Assessment & Plan:   Problem List Items Addressed This Visit    Essential (primary) hypertension     Blood pressure on recheck slightly elevated.  Reviewed outside checks - slightly elevated as outlined.  Continue losartan.  Add amlodipine 2.5mg  q day.  Follow pressures.  Follow  metabolic panel.        Relevant Medications   amLODipine (NORVASC) 2.5 MG tablet   Other Relevant Orders   CBC with Differential/Platelet   Basic metabolic panel   GERD (gastroesophageal reflux disease)    Controlled on pronix.        H/O syncope    Evaluated by cardiology.  Desires no further testing.  No recurring episodes.  Follow.        Pure hypercholesterolemia    On crestor.  Low cholesterol diet and exercise.  Follow lipid panel and liver function tests.        Relevant Medications   amLODipine (NORVASC) 2.5 MG tablet   Other Relevant Orders   Hepatic function panel   Lipid panel   Vitamin D deficiency, unspecified    Follow vitamin D level.         Other Visit Diagnoses    Prostate cancer screening    -  Primary   Relevant Orders   PSA, Medicare       Dale [place], MD

## 2018-03-04 ENCOUNTER — Encounter: Payer: Self-pay | Admitting: Internal Medicine

## 2018-03-04 NOTE — Assessment & Plan Note (Signed)
Follow vitamin D level.  

## 2018-03-04 NOTE — Assessment & Plan Note (Signed)
On crestor.  Low cholesterol diet and exercise.  Follow lipid panel and liver function tests.   

## 2018-03-04 NOTE — Assessment & Plan Note (Signed)
Blood pressure on recheck slightly elevated.  Reviewed outside checks - slightly elevated as outlined.  Continue losartan.  Add amlodipine 2.5mg  q day.  Follow pressures.  Follow metabolic panel.

## 2018-03-04 NOTE — Assessment & Plan Note (Signed)
Controlled on pronix.

## 2018-03-04 NOTE — Assessment & Plan Note (Signed)
Evaluated by cardiology.  Desires no further testing.  No recurring episodes.  Follow.

## 2018-03-05 ENCOUNTER — Other Ambulatory Visit: Payer: Self-pay | Admitting: Internal Medicine

## 2018-03-05 MED ORDER — LOSARTAN POTASSIUM 100 MG PO TABS: ORAL_TABLET | ORAL | 0 refills | Status: DC

## 2018-03-05 NOTE — Telephone Encounter (Signed)
Copied from CRM [id number]. Topic: Quick Communication - Rx Refill/Question >> Mar 05, 2018  8:52 AM Baldo Daub L wrote: Medication: losartan (COZAAR) 100 MG tablet  Has the patient contacted their pharmacy? No. Pt gets this through mail order and he forgot to order early enough.  He is out of medication today and mail order will not get to him for another 7 to 10 days.  Mail order suggested he call PCP and see if a short term supply could be sent to local pharmacy. (Agent: If no, request that the patient contact the pharmacy for the refill.) (Agent: If yes, when and what did the pharmacy advise?)  Preferred Pharmacy (with phone number or street name): [id number] - [place], [place] - [address] [place] [address] AT [facility] OF [address] [place] [address] [phone] (Phone) [phone] (Fax)  Agent: Please be advised that RX refills may take up to 3 business days. We ask that you follow-up with your pharmacy.

## 2018-03-05 NOTE — Telephone Encounter (Signed)
Pt asking for short term refill until mail order arives Requested Prescriptions  Pending Prescriptions Disp Refills  . losartan (COZAAR) 100 MG tablet 10 tablet 0    Sig: TAKE 1 TABLET BY MOUTH DAILY. GENERIC EQUIVALENT FOR COZAAR     Cardiovascular:  Angiotensin Receptor Blockers Passed - 03/05/2018  8:59 AM      Passed - Cr in normal range and within 180 days    Creatinine, Ser  Date Value Ref Range Status  10/24/2017 0.88 0.40 - 1.50 mg/dL Final         Passed - K in normal range and within 180 days    Potassium  Date Value Ref Range Status  10/24/2017 4.9 3.5 - 5.1 mEq/L Final         Passed - Patient is not pregnant      Passed - Last BP in normal range    BP Readings from Last 1 Encounters:  03/02/18 128/72         Passed - Valid encounter within last 6 months    Recent Outpatient Visits          3 days ago Prostate cancer screening   [facility] [place], Westley Hummer, MD   4 months ago Leukopenia, unspecified type   [facility] [place], Westley Hummer, MD   6 months ago Routine general medical examination at a health care facility   [facility], Westley Hummer, MD   11 months ago Essential (primary) hypertension   [facility] [place], Westley Hummer, MD   1 year ago Stress   [facility] [place], Westley Hummer, MD      Future Appointments            In 2 months Dale Hartford, MD [facility] [place], [facility]

## 2018-03-08 ENCOUNTER — Other Ambulatory Visit: Payer: Self-pay

## 2018-03-08 ENCOUNTER — Telehealth: Payer: Self-pay

## 2018-03-08 MED ORDER — LOSARTAN POTASSIUM 100 MG PO TABS: ORAL_TABLET | ORAL | 1 refills | Status: DC

## 2018-03-08 NOTE — Telephone Encounter (Signed)
Losartan sent in

## 2018-03-08 NOTE — Telephone Encounter (Signed)
Copied from CRM [id number]. Topic: General - Other >> Mar 08, 2018  1:18 PM Jaquita Rector A wrote: Reason for CRM: Patient called to say that the [facility]  reached out to him to let him know they are awaiting paperwork from Dr Lorin Picket that was faxed over for authorization so that they can get his medication. Please advise. Any questions pt can be reached at Ph# [phone]

## 2018-04-17 ENCOUNTER — Other Ambulatory Visit: Payer: Self-pay | Admitting: Internal Medicine

## 2018-05-01 ENCOUNTER — Other Ambulatory Visit: Payer: Self-pay

## 2018-05-01 ENCOUNTER — Other Ambulatory Visit (INDEPENDENT_AMBULATORY_CARE_PROVIDER_SITE_OTHER): Payer: BLUE CROSS/BLUE SHIELD

## 2018-05-01 DIAGNOSIS — Z125 Encounter for screening for malignant neoplasm of prostate: Secondary | ICD-10-CM

## 2018-05-01 DIAGNOSIS — E7800 Pure hypercholesterolemia, unspecified: Secondary | ICD-10-CM | POA: Diagnosis not present

## 2018-05-01 DIAGNOSIS — I10 Essential (primary) hypertension: Secondary | ICD-10-CM | POA: Diagnosis not present

## 2018-05-01 LAB — CBC WITH DIFFERENTIAL/PLATELET
BASOS ABS: 0 10*3/uL (ref 0.0–0.1)
Basophils Relative: 0.9 % (ref 0.0–3.0)
Eosinophils Absolute: 0.1 10*3/uL (ref 0.0–0.7)
Eosinophils Relative: 1.5 % (ref 0.0–5.0)
HCT: 41 % (ref 39.0–52.0)
Hemoglobin: 14.2 g/dL (ref 13.0–17.0)
Lymphocytes Relative: 39.5 % (ref 12.0–46.0)
Lymphs Abs: 1.9 10*3/uL (ref 0.7–4.0)
MCHC: 34.7 g/dL (ref 30.0–36.0)
MCV: 87.6 fl (ref 78.0–100.0)
Monocytes Absolute: 0.6 10*3/uL (ref 0.1–1.0)
Monocytes Relative: 12.9 % — ABNORMAL HIGH (ref 3.0–12.0)
NEUTROS PCT: 45.2 % (ref 43.0–77.0)
Neutro Abs: 2.1 10*3/uL (ref 1.4–7.7)
Platelets: 238 10*3/uL (ref 150.0–400.0)
RBC: 4.68 Mil/uL (ref 4.22–5.81)
RDW: 12.8 % (ref 11.5–15.5)
WBC: 4.7 10*3/uL (ref 4.0–10.5)

## 2018-05-01 LAB — BASIC METABOLIC PANEL
BUN: 11 mg/dL (ref 6–23)
CO2: 28 mEq/L (ref 19–32)
CREATININE: 0.89 mg/dL (ref 0.40–1.50)
Calcium: 9.1 mg/dL (ref 8.4–10.5)
Chloride: 98 mEq/L (ref 96–112)
GFR: 85.1 mL/min (ref >60.00)
Glucose, Bld: 89 mg/dL (ref 70–99)
Potassium: 4.7 mEq/L (ref 3.5–5.1)
Sodium: 133 mEq/L — ABNORMAL LOW (ref 135–145)

## 2018-05-01 LAB — HEPATIC FUNCTION PANEL
ALT: 19 U/L (ref 0–53)
AST: 21 U/L (ref 0–37)
Albumin: 4.5 g/dL (ref 3.5–5.2)
Alkaline Phosphatase: 67 U/L (ref 39–117)
Bilirubin, Direct: 0.1 mg/dL (ref 0.0–0.3)
Total Bilirubin: 0.4 mg/dL (ref 0.2–1.2)
Total Protein: 6.8 g/dL (ref 6.0–8.3)

## 2018-05-01 LAB — LIPID PANEL
Cholesterol: 177 mg/dL (ref 0–200)
HDL: 76.1 mg/dL (ref >39.00)
LDL Cholesterol: 86 mg/dL (ref 0–99)
NonHDL: 101.26
TRIGLYCERIDES: 77 mg/dL (ref 0.0–149.0)
Total CHOL/HDL Ratio: 2
VLDL: 15.4 mg/dL (ref 0.0–40.0)

## 2018-05-01 LAB — PSA, MEDICARE: PSA: 1 ng/mL (ref 0.10–4.00)

## 2018-05-04 ENCOUNTER — Encounter: Payer: Self-pay | Admitting: Internal Medicine

## 2018-05-04 ENCOUNTER — Ambulatory Visit (INDEPENDENT_AMBULATORY_CARE_PROVIDER_SITE_OTHER): Payer: BLUE CROSS/BLUE SHIELD | Admitting: Internal Medicine

## 2018-05-04 DIAGNOSIS — I10 Essential (primary) hypertension: Secondary | ICD-10-CM

## 2018-05-04 DIAGNOSIS — E7800 Pure hypercholesterolemia, unspecified: Secondary | ICD-10-CM

## 2018-05-04 DIAGNOSIS — K219 Gastro-esophageal reflux disease without esophagitis: Secondary | ICD-10-CM

## 2018-05-04 DIAGNOSIS — Z87898 Personal history of other specified conditions: Secondary | ICD-10-CM

## 2018-05-04 DIAGNOSIS — F439 Reaction to severe stress, unspecified: Secondary | ICD-10-CM

## 2018-05-04 MED ORDER — AMLODIPINE BESYLATE 2.5 MG PO TABS: 2.5000 mg | ORAL_TABLET | Freq: Every day | ORAL | 0 refills | Status: DC

## 2018-05-04 NOTE — Progress Notes (Addendum)
Patient ID: Scott Cohen, male   DOB: [birth year], 68 y.o.   MRN: [id number] Virtual Visit via Video Note  I connected with Scott Cohen on 05/04/18 at 11:00 AM EDT by a video enabled telemedicine application and verified that I am speaking with the correct person using two identifiers.  Location patient: home Location provider:work Persons participating in the virtual visit: patient, provider  I discussed the limitations of evaluation and management by telemedicine.  This visit type was conducted due to national recommendations for restrictions regarding the COVID-19 pandemic.  This format is felt to be most appropriate for this patient at this time.  The patient expressed understanding and agreed to proceed.   HPI: This is a scheduled follow up.  Schedule to f/u on his blood pressure.  States he is doing relatively well.  Not exercising as much.  Discussed importance of exercise.  He stays active. Trying to watch what he eats.  No chest pain.  No sob.  No syncope or near syncope.  No acid reflux.  No abdominal pain.  Bowels moving.  No urine change.  Added amlodipine last visit.  Blood pressures averaging 118-134/70-80s.  Tolerating medication.  No headache or dizziness.  Handling stress relatively well.  Does not feel needs anything more.     ROS: See pertinent positives and negatives per HPI.  Past Medical History:  Diagnosis Date  . Hypercholesterolemia   . Hypertension   . Leukopenia    benign cyclical    Past Surgical History:  Procedure Laterality Date  . WISDOM TOOTH EXTRACTION      Family History  Problem Relation Age of Onset  . Heart disease Mother        pacemaker  . Diabetes Mother   . Heart disease Father        myocardial infarction  . Hypertension Father   . Diabetes Father   . CVA Father   . Colon cancer Neg Hx   . Prostate cancer Neg Hx     SOCIAL HX: reviewed.    Current Outpatient Medications:  .  amLODipine (NORVASC) 2.5 MG tablet, Take 1 tablet (2.5 mg  total) by mouth daily., Disp: 30 tablet, Rfl: 0 .  aspirin EC 81 MG tablet, Take 81 mg by mouth daily., Disp: , Rfl:  .  losartan (COZAAR) 100 MG tablet, TAKE 1 TABLET BY MOUTH DAILY. GENERIC EQUIVALENT FOR COZAAR, Disp: 90 tablet, Rfl: 1 .  pantoprazole (PROTONIX) 40 MG tablet, Take 1 tablet (40 mg total) by mouth daily., Disp: 90 tablet, Rfl: 1 .  rosuvastatin (CRESTOR) 10 MG tablet, TAKE 1 TABLET BY MOUTH EVERY DAY, Disp: 90 tablet, Rfl: 0  EXAM:  VITALS per patient if applicable: blood pressures as outlined above.   GENERAL: alert, oriented, appears well and in no acute distress  HEENT: atraumatic, conjunttiva clear, no obvious abnormalities on inspection of external nose  NECK: normal movements of the head and neck  LUNGS: on inspection no signs of respiratory distress, breathing rate appears normal, no obvious gross SOB, gasping or wheezing  CV: no obvious cyanosis  PSYCH/NEURO: pleasant and cooperative, no obvious depression or anxiety, speech and thought processing grossly intact  ASSESSMENT AND PLAN:  Discussed the following assessment and plan:  Essential (primary) hypertension  Gastroesophageal reflux disease, esophagitis presence not specified  H/O syncope  Pure hypercholesterolemia  Stress     I discussed the assessment and treatment plan with the patient. The patient was provided an opportunity to ask questions and  all were answered. The patient agreed with the plan and demonstrated an understanding of the instructions.   The patient was advised to call back or seek an in-person evaluation if the symptoms worsen or if the condition fails to improve as anticipated.  I provided 15 minutes of non-face-to-face time during this encounter.   Einar Pheasant, MD

## 2018-05-06 ENCOUNTER — Encounter: Payer: Self-pay | Admitting: Internal Medicine

## 2018-05-06 NOTE — Assessment & Plan Note (Signed)
Blood pressure as outlined.  Tolerating amlodipine.  Continue current medication regimen.  Follow pressures.  Follow metabolic panel.

## 2018-05-06 NOTE — Assessment & Plan Note (Signed)
Evaluated by cardiology.  No recurring episodes.  Follow.  Doing well.

## 2018-05-06 NOTE — Assessment & Plan Note (Signed)
On crestor.  Low cholesterol diet and exercise.  Follow lipid panel and liver function tests.   

## 2018-05-06 NOTE — Assessment & Plan Note (Signed)
Controlled on current regimen.  Follow.  

## 2018-05-06 NOTE — Assessment & Plan Note (Signed)
Handling things relatively well.  Does not feel needs any further intervention.

## 2018-05-25 ENCOUNTER — Encounter: Payer: Self-pay | Admitting: Internal Medicine

## 2018-05-25 ENCOUNTER — Other Ambulatory Visit: Payer: Self-pay

## 2018-05-25 MED ORDER — AMLODIPINE BESYLATE 2.5 MG PO TABS: 2.5000 mg | ORAL_TABLET | Freq: Every day | ORAL | 0 refills | Status: DC

## 2018-07-24 ENCOUNTER — Other Ambulatory Visit: Payer: Self-pay

## 2018-07-24 ENCOUNTER — Encounter: Payer: Self-pay | Admitting: Internal Medicine

## 2018-07-24 MED ORDER — ROSUVASTATIN CALCIUM 10 MG PO TABS: 10.0000 mg | ORAL_TABLET | Freq: Every day | ORAL | 3 refills | Status: DC

## 2018-08-31 ENCOUNTER — Other Ambulatory Visit: Payer: Self-pay | Admitting: Internal Medicine

## 2018-09-03 MED ORDER — AMLODIPINE BESYLATE 2.5 MG PO TABS: 2.5000 mg | ORAL_TABLET | Freq: Every day | ORAL | 1 refills | Status: DC

## 2018-09-13 ENCOUNTER — Encounter: Payer: Self-pay | Admitting: Internal Medicine

## 2018-09-13 ENCOUNTER — Other Ambulatory Visit: Payer: Self-pay

## 2018-09-13 MED ORDER — LOSARTAN POTASSIUM 100 MG PO TABS: ORAL_TABLET | ORAL | 2 refills | Status: DC

## 2018-09-13 NOTE — Telephone Encounter (Signed)
Gave info to [facility]. Patient will go there to be evaluated. Refilled losartan and gave instruction about seeing patients in the office.

## 2018-09-28 DIAGNOSIS — M25561 Pain in right knee: Secondary | ICD-10-CM | POA: Diagnosis not present

## 2018-10-02 ENCOUNTER — Other Ambulatory Visit: Payer: Self-pay | Admitting: Sports Medicine

## 2018-10-02 DIAGNOSIS — M25561 Pain in right knee: Secondary | ICD-10-CM

## 2018-10-17 ENCOUNTER — Ambulatory Visit (INDEPENDENT_AMBULATORY_CARE_PROVIDER_SITE_OTHER): Payer: BC Managed Care – PPO | Admitting: Internal Medicine

## 2018-10-17 ENCOUNTER — Other Ambulatory Visit: Payer: Self-pay

## 2018-10-17 ENCOUNTER — Encounter: Payer: Self-pay | Admitting: Internal Medicine

## 2018-10-17 VITALS — BP 130/74 | HR 74 | Temp 96.8°F | Resp 16 | Wt 158.0 lb

## 2018-10-17 DIAGNOSIS — Z23 Encounter for immunization: Secondary | ICD-10-CM | POA: Diagnosis not present

## 2018-10-17 DIAGNOSIS — K219 Gastro-esophageal reflux disease without esophagitis: Secondary | ICD-10-CM

## 2018-10-17 DIAGNOSIS — M25561 Pain in right knee: Secondary | ICD-10-CM

## 2018-10-17 DIAGNOSIS — F439 Reaction to severe stress, unspecified: Secondary | ICD-10-CM

## 2018-10-17 DIAGNOSIS — I10 Essential (primary) hypertension: Secondary | ICD-10-CM

## 2018-10-17 DIAGNOSIS — Z0000 Encounter for general adult medical examination without abnormal findings: Secondary | ICD-10-CM

## 2018-10-17 DIAGNOSIS — Z87898 Personal history of other specified conditions: Secondary | ICD-10-CM

## 2018-10-17 DIAGNOSIS — E7800 Pure hypercholesterolemia, unspecified: Secondary | ICD-10-CM

## 2018-10-17 NOTE — Progress Notes (Signed)
Patient ID: Scott Cohen, male   DOB: 05-20-1950, 68 y.o.   MRN: [id number]   Subjective:    Patient ID: Scott Cohen, male    DOB: [birth year], 68 y.o.   MRN: [id number]  HPI  Patient here for his physical exam.  He reports he has had problems with his right knee.  Some pain with exercise and activity.  Discussed further w/up and evaluation.  He wants to monitor.  Tries to stay active.  No chest pain. No sob. No acid reflux. No abdominal pain.  Bowels moving.  Handling sress.  Discussed with him today.  Discussed prevnar and shingrix.  Needs labs.  No syncope or near syncope.     Past Medical History:  Diagnosis Date  . Hypercholesterolemia   . Hypertension   . Leukopenia    benign cyclical   Past Surgical History:  Procedure Laterality Date  . WISDOM TOOTH EXTRACTION     Family History  Problem Relation Age of Onset  . Heart disease Mother        pacemaker  . Diabetes Mother   . Heart disease Father        myocardial infarction  . Hypertension Father   . Diabetes Father   . CVA Father   . Colon cancer Neg Hx   . Prostate cancer Neg Hx    Social History   Socioeconomic History  . Marital status: Married    Spouse name: Not on file  . Number of children: Not on file  . Years of education: Not on file  . Highest education level: Not on file  Occupational History    Employer: [employer]  Social Needs  . Financial resource strain: Not on file  . Food insecurity    Worry: Not on file    Inability: Not on file  . Transportation needs    Medical: Not on file    Non-medical: Not on file  Tobacco Use  . Smoking status: Never Smoker  . Smokeless tobacco: Never Used  Substance and Sexual Activity  . Alcohol use: Yes    Alcohol/week: 0.0 standard drinks    Comment: 2-3 beers daily  . Drug use: No  . Sexual activity: Not on file  Lifestyle  . Physical activity    Days per week: Not on file    Minutes per session: Not on file  . Stress: Not on file   Relationships  . Social Musician on phone: Not on file    Gets together: Not on file    Attends religious service: Not on file    Active member of club or organization: Not on file    Attends meetings of clubs or organizations: Not on file    Relationship status: Not on file  Other Topics Concern  . Not on file  Social History Narrative  . Not on file    Outpatient Encounter Medications as of 10/17/2018  Medication Sig  . amLODipine (NORVASC) 2.5 MG tablet Take 1 tablet (2.5 mg total) by mouth daily.  Marland Kitchen aspirin EC 81 MG tablet Take 81 mg by mouth daily.  Marland Kitchen losartan (COZAAR) 100 MG tablet TAKE 1 TABLET BY MOUTH DAILY. GENERIC EQUIVALENT FOR COZAAR  . pantoprazole (PROTONIX) 40 MG tablet Take 1 tablet (40 mg total) by mouth daily.  . rosuvastatin (CRESTOR) 10 MG tablet Take 1 tablet (10 mg total) by mouth daily.   No facility-administered encounter medications on file as of 10/17/2018.  Review of Systems  Constitutional: Negative for appetite change and unexpected weight change.  HENT: Negative for congestion and sinus pressure.   Eyes: Negative for pain and visual disturbance.  Respiratory: Negative for cough, chest tightness and shortness of breath.   Cardiovascular: Negative for chest pain, palpitations and leg swelling.  Gastrointestinal: Negative for abdominal pain, diarrhea, nausea and vomiting.  Genitourinary: Negative for difficulty urinating and dysuria.  Musculoskeletal: Negative for joint swelling and myalgias.       Right knee pain as outlined.    Skin: Negative for color change and rash.  Neurological: Negative for dizziness, light-headedness and headaches.  Hematological: Negative for adenopathy. Does not bruise/bleed easily.  Psychiatric/Behavioral: Negative for agitation and dysphoric mood.       Objective:    Physical Exam Constitutional:      General: He is not in acute distress.    Appearance: Normal appearance. He is well-developed.   HENT:     Head: Normocephalic and atraumatic.     Right Ear: External ear normal.     Left Ear: External ear normal.  Eyes:     General: No scleral icterus.       Right eye: No discharge.        Left eye: No discharge.     Conjunctiva/sclera: Conjunctivae normal.  Neck:     Musculoskeletal: Neck supple. No muscular tenderness.     Thyroid: No thyromegaly.  Cardiovascular:     Rate and Rhythm: Normal rate and regular rhythm.  Pulmonary:     Effort: No respiratory distress.     Breath sounds: Normal breath sounds. No wheezing.  Abdominal:     General: Bowel sounds are normal.     Palpations: Abdomen is soft.     Tenderness: There is no abdominal tenderness.  Genitourinary:    Comments: Not performed.   Musculoskeletal:        General: No swelling or tenderness.  Lymphadenopathy:     Cervical: No cervical adenopathy.  Skin:    Findings: No erythema or rash.  Neurological:     Mental Status: He is alert and oriented to person, place, and time.  Psychiatric:        Mood and Affect: Mood normal.        Behavior: Behavior normal.     BP 130/74   Pulse 74   Temp (!) 96.8 F (36 C)   Resp 16   Wt 158 lb (71.7 kg)   SpO2 99%   BMI 22.04 kg/m  Wt Readings from Last 3 Encounters:  10/17/18 158 lb (71.7 kg)  03/02/18 156 lb 6.4 oz (70.9 kg)  10/30/17 158 lb (71.7 kg)     Lab Results  Component Value Date   WBC 4.7 05/01/2018   HGB 14.2 05/01/2018   HCT 41.0 05/01/2018   PLT 238.0 05/01/2018   GLUCOSE 89 05/01/2018   CHOL 177 05/01/2018   TRIG 77.0 05/01/2018   HDL 76.10 05/01/2018   LDLDIRECT 144.2 08/22/2012   LDLCALC 86 05/01/2018   ALT 19 05/01/2018   AST 21 05/01/2018   NA 133 (L) 05/01/2018   K 4.7 05/01/2018   CL 98 05/01/2018   CREATININE 0.89 05/01/2018   BUN 11 05/01/2018   CO2 28 05/01/2018   TSH 1.60 09/04/2017   PSA 1.00 05/01/2018    Dg Chest 1 View  Result Date: 07/09/2015 CLINICAL DATA:  Syncope EXAM: CHEST 1 VIEW COMPARISON:  None.  FINDINGS: There is no edema or consolidation. Heart  size and pulmonary vascularity are normal. No adenopathy. No bone lesions. IMPRESSION: No edema or consolidation. Electronically Signed   By: Bretta BangWilliam  Woodruff III M.D.   On: 07/09/2015 12:26       Assessment & Plan:   Problem List Items Addressed This Visit    Essential (primary) hypertension    Blood pressure as outlined.  Continue current medication regimen.  Follow pressures.  Follow metabolic panel.        Relevant Orders   TSH   Basic metabolic panel   GERD (gastroesophageal reflux disease)    Controlled on current medication regimen.  Follow.        H/O syncope    Evaluated by cardiology previously.  No recurring episodes.  Feels good.  Follow.        Healthcare maintenance    Physical today 10/17/18.  Colonoscopy 12/2017.  Obtain results.  PSA 1.0 05/01/18.  Will return for prevnar in a few weeks.        Pure hypercholesterolemia    On crestor.  Low cholesterol diet and exercise.  Follow lipid panel.        Relevant Orders   Hepatic function panel   Lipid panel   Right knee pain    Right knee pain as outlined.  Desires no further intervention.  Follow.        Stress    Discussed with him today.  Overall he feels he is handling things well.  Follow.         Other Visit Diagnoses    Need for immunization against influenza       Relevant Orders   Flu Vaccine QUAD High Dose(Fluad) (Completed)       Dale Durhamharlene Renato Spellman, MD

## 2018-10-17 NOTE — Assessment & Plan Note (Addendum)
Physical today 10/17/18.  Colonoscopy 12/2017.  Obtain results.  PSA 1.0 05/01/18.  Will return for prevnar in a few weeks.

## 2018-10-18 DIAGNOSIS — Z23 Encounter for immunization: Secondary | ICD-10-CM | POA: Diagnosis not present

## 2018-10-18 DIAGNOSIS — Z0000 Encounter for general adult medical examination without abnormal findings: Secondary | ICD-10-CM | POA: Diagnosis not present

## 2018-10-21 ENCOUNTER — Encounter: Payer: Self-pay | Admitting: Internal Medicine

## 2018-10-21 DIAGNOSIS — M25561 Pain in right knee: Secondary | ICD-10-CM | POA: Insufficient documentation

## 2018-10-21 NOTE — Assessment & Plan Note (Signed)
Right knee pain as outlined.  Desires no further intervention.  Follow.

## 2018-10-21 NOTE — Assessment & Plan Note (Signed)
Blood pressure as outlined.  Continue current medication regimen.  Follow pressures.  Follow metabolic panel.  

## 2018-10-21 NOTE — Assessment & Plan Note (Signed)
Controlled on current medication regimen.  Follow.   

## 2018-10-21 NOTE — Assessment & Plan Note (Signed)
On crestor.  Low cholesterol diet and exercise.  Follow lipid panel.  

## 2018-10-21 NOTE — Assessment & Plan Note (Signed)
Discussed with him today.  Overall he feels he is handling things well.  Follow.

## 2018-10-21 NOTE — Assessment & Plan Note (Signed)
Evaluated by cardiology previously.  No recurring episodes.  Feels good.  Follow.

## 2018-10-23 ENCOUNTER — Ambulatory Visit: Payer: BC Managed Care – PPO

## 2018-11-01 ENCOUNTER — Other Ambulatory Visit: Payer: BC Managed Care – PPO

## 2018-11-01 ENCOUNTER — Ambulatory Visit: Payer: BC Managed Care – PPO

## 2018-11-28 ENCOUNTER — Other Ambulatory Visit: Payer: Self-pay

## 2018-11-29 ENCOUNTER — Encounter: Payer: Self-pay | Admitting: Internal Medicine

## 2018-11-30 ENCOUNTER — Ambulatory Visit (INDEPENDENT_AMBULATORY_CARE_PROVIDER_SITE_OTHER): Payer: BC Managed Care – PPO | Admitting: *Deleted

## 2018-11-30 ENCOUNTER — Other Ambulatory Visit: Payer: Self-pay

## 2018-11-30 ENCOUNTER — Other Ambulatory Visit (INDEPENDENT_AMBULATORY_CARE_PROVIDER_SITE_OTHER): Payer: BC Managed Care – PPO

## 2018-11-30 DIAGNOSIS — I10 Essential (primary) hypertension: Secondary | ICD-10-CM

## 2018-11-30 DIAGNOSIS — Z23 Encounter for immunization: Secondary | ICD-10-CM | POA: Diagnosis not present

## 2018-11-30 DIAGNOSIS — E7800 Pure hypercholesterolemia, unspecified: Secondary | ICD-10-CM

## 2018-11-30 LAB — LIPID PANEL
Cholesterol: 155 mg/dL (ref 0–200)
HDL: 72.1 mg/dL (ref >39.00)
LDL Cholesterol: 70 mg/dL (ref 0–99)
NonHDL: 82.85
Total CHOL/HDL Ratio: 2
Triglycerides: 64 mg/dL (ref 0.0–149.0)
VLDL: 12.8 mg/dL (ref 0.0–40.0)

## 2018-11-30 LAB — HEPATIC FUNCTION PANEL
ALT: 22 U/L (ref 0–53)
AST: 24 U/L (ref 0–37)
Albumin: 4.4 g/dL (ref 3.5–5.2)
Alkaline Phosphatase: 65 U/L (ref 39–117)
Bilirubin, Direct: 0.1 mg/dL (ref 0.0–0.3)
Total Bilirubin: 0.5 mg/dL (ref 0.2–1.2)
Total Protein: 6.4 g/dL (ref 6.0–8.3)

## 2018-11-30 LAB — TSH: TSH: 1.25 u[IU]/mL (ref 0.35–4.50)

## 2018-11-30 LAB — BASIC METABOLIC PANEL
BUN: 11 mg/dL (ref 6–23)
CO2: 28 mEq/L (ref 19–32)
Calcium: 9 mg/dL (ref 8.4–10.5)
Chloride: 99 mEq/L (ref 96–112)
Creatinine, Ser: 0.91 mg/dL (ref 0.40–1.50)
GFR: 82.8 mL/min (ref >60.00)
Glucose, Bld: 102 mg/dL — ABNORMAL HIGH (ref 70–99)
Potassium: 4.8 mEq/L (ref 3.5–5.1)
Sodium: 134 mEq/L — ABNORMAL LOW (ref 135–145)

## 2018-12-01 ENCOUNTER — Encounter: Payer: Self-pay | Admitting: Internal Medicine

## 2018-12-12 DIAGNOSIS — D225 Melanocytic nevi of trunk: Secondary | ICD-10-CM | POA: Diagnosis not present

## 2018-12-12 DIAGNOSIS — L570 Actinic keratosis: Secondary | ICD-10-CM | POA: Diagnosis not present

## 2018-12-12 DIAGNOSIS — D2262 Melanocytic nevi of left upper limb, including shoulder: Secondary | ICD-10-CM | POA: Diagnosis not present

## 2018-12-12 DIAGNOSIS — D2261 Melanocytic nevi of right upper limb, including shoulder: Secondary | ICD-10-CM | POA: Diagnosis not present

## 2018-12-12 DIAGNOSIS — D2272 Melanocytic nevi of left lower limb, including hip: Secondary | ICD-10-CM | POA: Diagnosis not present

## 2018-12-12 DIAGNOSIS — D485 Neoplasm of uncertain behavior of skin: Secondary | ICD-10-CM | POA: Diagnosis not present

## 2019-01-03 DIAGNOSIS — L570 Actinic keratosis: Secondary | ICD-10-CM | POA: Diagnosis not present

## 2019-03-11 ENCOUNTER — Other Ambulatory Visit: Payer: Self-pay

## 2019-03-11 ENCOUNTER — Encounter: Payer: Self-pay | Admitting: Internal Medicine

## 2019-03-11 MED ORDER — AMLODIPINE BESYLATE 2.5 MG PO TABS: 2.5000 mg | ORAL_TABLET | Freq: Every day | ORAL | 1 refills | Status: DC

## 2019-04-19 ENCOUNTER — Ambulatory Visit (INDEPENDENT_AMBULATORY_CARE_PROVIDER_SITE_OTHER): Payer: BC Managed Care – PPO

## 2019-04-19 ENCOUNTER — Encounter: Payer: Self-pay | Admitting: Internal Medicine

## 2019-04-19 ENCOUNTER — Other Ambulatory Visit: Payer: Self-pay

## 2019-04-19 ENCOUNTER — Ambulatory Visit (INDEPENDENT_AMBULATORY_CARE_PROVIDER_SITE_OTHER): Payer: BC Managed Care – PPO | Admitting: Internal Medicine

## 2019-04-19 DIAGNOSIS — D72819 Decreased white blood cell count, unspecified: Secondary | ICD-10-CM | POA: Diagnosis not present

## 2019-04-19 DIAGNOSIS — I10 Essential (primary) hypertension: Secondary | ICD-10-CM

## 2019-04-19 DIAGNOSIS — E7800 Pure hypercholesterolemia, unspecified: Secondary | ICD-10-CM

## 2019-04-19 DIAGNOSIS — F439 Reaction to severe stress, unspecified: Secondary | ICD-10-CM

## 2019-04-19 DIAGNOSIS — M25512 Pain in left shoulder: Secondary | ICD-10-CM

## 2019-04-19 DIAGNOSIS — Z87898 Personal history of other specified conditions: Secondary | ICD-10-CM

## 2019-04-19 DIAGNOSIS — K219 Gastro-esophageal reflux disease without esophagitis: Secondary | ICD-10-CM | POA: Diagnosis not present

## 2019-04-19 NOTE — Progress Notes (Signed)
Patient ID: Scott Cohen, male   DOB: 1950-07-30, 69 y.o.   MRN: [id number]   Subjective:    Patient ID: Scott Cohen, male    DOB: 11/12/1950, 69 y.o.   MRN: [id number]  HPI  Patient here for a scheduled follow up.  He reports stress is a little better.  Overall handling things relatively well. Tries to stay active.  Not exercising as much.  No chest pain.  No sob. No acid reflux.  No abdominal pain or bowel changes reported.  Had another episode 02/2019 - sitting at his desk.  Just briefly felt a little change (?confused).  Same as previous episodes.  No syncope or near syncope.  No headache.  No lightheadedness or dizziness. Had previous cardiac w/up and saw neurology.  Discussed further w/up.  He wants to monitor.  Desires no further w/up at this time.  Does report left shoulder/axilla - pain.  No known injury.  persistent pain.     Past Medical History:  Diagnosis Date  . Hypercholesterolemia   . Hypertension   . Leukopenia    benign cyclical   Past Surgical History:  Procedure Laterality Date  . WISDOM TOOTH EXTRACTION     Family History  Problem Relation Age of Onset  . Heart disease Mother        pacemaker  . Diabetes Mother   . Heart disease Father        myocardial infarction  . Hypertension Father   . Diabetes Father   . CVA Father   . Colon cancer Neg Hx   . Prostate cancer Neg Hx    Social History   Socioeconomic History  . Marital status: Married    Spouse name: Not on file  . Number of children: Not on file  . Years of education: Not on file  . Highest education level: Not on file  Occupational History    Employer: [employer]  Tobacco Use  . Smoking status: Never Smoker  . Smokeless tobacco: Never Used  Substance and Sexual Activity  . Alcohol use: Yes    Alcohol/week: 0.0 standard drinks    Comment: 2-3 beers daily  . Drug use: No  . Sexual activity: Not on file  Other Topics Concern  . Not on file  Social History Narrative  . Not on file    Social Determinants of Health   Financial Resource Strain:   . Difficulty of Paying Living Expenses:   Food Insecurity:   . Worried About Programme researcher, broadcasting/film/video in the Last Year:   . Barista in the Last Year:   Transportation Needs:   . Freight forwarder (Medical):   Marland Kitchen Lack of Transportation (Non-Medical):   Physical Activity:   . Days of Exercise per Week:   . Minutes of Exercise per Session:   Stress:   . Feeling of Stress :   Social Connections:   . Frequency of Communication with Friends and Family:   . Frequency of Social Gatherings with Friends and Family:   . Attends Religious Services:   . Active Member of Clubs or Organizations:   . Attends Banker Meetings:   Marland Kitchen Marital Status:     Outpatient Encounter Medications as of 04/19/2019  Medication Sig  . amLODipine (NORVASC) 2.5 MG tablet Take 1 tablet (2.5 mg total) by mouth daily.  Marland Kitchen aspirin EC 81 MG tablet Take 81 mg by mouth daily.  Marland Kitchen losartan (COZAAR) 100 MG tablet TAKE  1 TABLET BY MOUTH DAILY. GENERIC EQUIVALENT FOR COZAAR  . pantoprazole (PROTONIX) 40 MG tablet Take 1 tablet (40 mg total) by mouth daily.  . rosuvastatin (CRESTOR) 10 MG tablet Take 1 tablet (10 mg total) by mouth daily.   No facility-administered encounter medications on file as of 04/19/2019.    Review of Systems  Constitutional: Negative for appetite change and unexpected weight change.  HENT: Negative for congestion and sinus pressure.   Respiratory: Negative for cough, chest tightness and shortness of breath.   Cardiovascular: Negative for chest pain, palpitations and leg swelling.  Gastrointestinal: Negative for abdominal pain, diarrhea, nausea and vomiting.  Genitourinary: Negative for difficulty urinating and dysuria.  Musculoskeletal: Negative for joint swelling and myalgias.       Left shoulder pain as outlined.   Skin: Negative for color change and rash.  Neurological: Negative for dizziness, light-headedness  and headaches.  Psychiatric/Behavioral: Negative for agitation and dysphoric mood.       Objective:    Physical Exam Constitutional:      General: He is not in acute distress.    Appearance: Normal appearance. He is well-developed.  HENT:     Head: Normocephalic and atraumatic.     Right Ear: External ear normal.     Left Ear: External ear normal.  Eyes:     General: No scleral icterus.       Right eye: No discharge.        Left eye: No discharge.     Conjunctiva/sclera: Conjunctivae normal.  Cardiovascular:     Rate and Rhythm: Normal rate and regular rhythm.  Pulmonary:     Effort: Pulmonary effort is normal. No respiratory distress.     Breath sounds: Normal breath sounds.  Abdominal:     General: Bowel sounds are normal.     Palpations: Abdomen is soft.     Tenderness: There is no abdominal tenderness.  Musculoskeletal:        General: No swelling or tenderness.     Cervical back: Neck supple. No tenderness.  Lymphadenopathy:     Cervical: No cervical adenopathy.  Skin:    Findings: No erythema or rash.  Neurological:     Mental Status: He is alert.  Psychiatric:        Mood and Affect: Mood normal.        Behavior: Behavior normal.     BP 124/70   Pulse 61   Temp (!) 96.8 F (36 C)   Resp 16   Ht 5\' 11"  (1.803 m)   Wt 162 lb 12.8 oz (73.8 kg)   SpO2 97%   BMI 22.71 kg/m  Wt Readings from Last 3 Encounters:  04/19/19 162 lb 12.8 oz (73.8 kg)  10/17/18 158 lb (71.7 kg)  03/02/18 156 lb 6.4 oz (70.9 kg)     Lab Results  Component Value Date   WBC 4.7 05/01/2018   HGB 14.2 05/01/2018   HCT 41.0 05/01/2018   PLT 238.0 05/01/2018   GLUCOSE 102 (H) 11/30/2018   CHOL 155 11/30/2018   TRIG 64.0 11/30/2018   HDL 72.10 11/30/2018   LDLDIRECT 144.2 08/22/2012   LDLCALC 70 11/30/2018   ALT 22 11/30/2018   AST 24 11/30/2018   NA 134 (L) 11/30/2018   K 4.8 11/30/2018   CL 99 11/30/2018   CREATININE 0.91 11/30/2018   BUN 11 11/30/2018   CO2 28  11/30/2018   TSH 1.25 11/30/2018   PSA 1.00 05/01/2018    DG  Chest 1 View  Result Date: 07/09/2015 CLINICAL DATA:  Syncope EXAM: CHEST 1 VIEW COMPARISON:  None. FINDINGS: There is no edema or consolidation. Heart size and pulmonary vascularity are normal. No adenopathy. No bone lesions. IMPRESSION: No edema or consolidation. Electronically Signed   By: Lowella Grip III M.D.   On: 07/09/2015 12:26       Assessment & Plan:   Problem List Items Addressed This Visit    Decreased white blood cell count    Last white blood cel count wnl.        Essential (primary) hypertension    Blood pressure under good control.  Continue same medication regimen - losartan and amlodipine.   Follow pressures.  Follow metabolic panel.        Relevant Orders   Basic metabolic panel   GERD (gastroesophageal reflux disease)    Controlled on current medication regimen.  Follow.       H/O syncope    Evaluated by cardiology previously.  No syncope recently.  Mild episode as outlined.  Discussed further w/up.  He wants to monitor.  Will notify me if recurs.        Pure hypercholesterolemia    On crestor.  Low cholesterol diet and exercise.  Follow lipid panel and liver function tests.        Relevant Orders   Hepatic function panel   Lipid panel   Shoulder pain, left    Left shoulder pain as outlined.  Will check xray.  Further w/up pending results.        Relevant Orders   DG Shoulder Left (Completed)   Stress    Stress is better.  Does not feel needs any further intervention.  Follow.            Einar Pheasant, MD

## 2019-04-20 ENCOUNTER — Encounter: Payer: Self-pay | Admitting: Internal Medicine

## 2019-04-27 ENCOUNTER — Encounter: Payer: Self-pay | Admitting: Internal Medicine

## 2019-04-27 NOTE — Assessment & Plan Note (Signed)
Left shoulder pain as outlined.  Will check xray.  Further w/up pending results.

## 2019-04-27 NOTE — Assessment & Plan Note (Signed)
On crestor.  Low cholesterol diet and exercise.  Follow lipid panel and liver function tests.   

## 2019-04-27 NOTE — Assessment & Plan Note (Signed)
Stress is better.  Does not feel needs any further intervention.  Follow.   

## 2019-04-27 NOTE — Assessment & Plan Note (Signed)
Last white blood cel count wnl.

## 2019-04-27 NOTE — Assessment & Plan Note (Signed)
Controlled on current medication regimen.  Follow.   

## 2019-04-27 NOTE — Assessment & Plan Note (Signed)
Evaluated by cardiology previously.  No syncope recently.  Mild episode as outlined.  Discussed further w/up.  He wants to monitor.  Will notify me if recurs.

## 2019-04-27 NOTE — Assessment & Plan Note (Addendum)
Blood pressure under good control.  Continue same medication regimen - losartan and amlodipine.  Follow pressures.  Follow metabolic panel.   

## 2019-05-18 ENCOUNTER — Other Ambulatory Visit: Payer: Self-pay

## 2019-05-18 ENCOUNTER — Ambulatory Visit
Admission: EM | Admit: 2019-05-18 | Discharge: 2019-05-18 | Disposition: A | Discharge status: Home / Self Care | Payer: BC Managed Care – PPO | Attending: Urgent Care | Admitting: Urgent Care

## 2019-05-18 ENCOUNTER — Encounter: Payer: Self-pay | Admitting: Emergency Medicine

## 2019-05-18 DIAGNOSIS — Z23 Encounter for immunization: Secondary | ICD-10-CM | POA: Diagnosis not present

## 2019-05-18 DIAGNOSIS — W540XXA Bitten by dog, initial encounter: Secondary | ICD-10-CM | POA: Diagnosis not present

## 2019-05-18 DIAGNOSIS — S71151A Open bite, right thigh, initial encounter: Secondary | ICD-10-CM

## 2019-05-18 MED ORDER — BACITRACIN ZINC 500 UNIT/GM EX OINT: TOPICAL_OINTMENT | Freq: Once | CUTANEOUS | Status: AC

## 2019-05-18 MED ORDER — TETANUS-DIPHTH-ACELL PERTUSSIS 5-2.5-18.5 LF-MCG/0.5 IM SUSP
0.5000 mL | Freq: Once | INTRAMUSCULAR | Status: AC
  Administered 2019-05-18: 15:00:00 0.5 mL via INTRAMUSCULAR

## 2019-05-18 MED ORDER — MUPIROCIN 2 % EX OINT: TOPICAL_OINTMENT | CUTANEOUS | 0 refills | Status: DC

## 2019-05-18 MED ORDER — AMOXICILLIN-POT CLAVULANATE 875-125 MG PO TABS: 1.0000 | ORAL_TABLET | Freq: Two times a day (BID) | ORAL | 0 refills | Status: AC

## 2019-05-18 NOTE — ED Notes (Signed)
Applied non stick dressing and paper tape after applying Bactroban

## 2019-05-18 NOTE — ED Triage Notes (Signed)
Pt has a dog bite on the back of his right thigh. Occurred about 12:15 today. 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 He does not have any puncture wounds but has scratches and swelling.

## 2019-05-18 NOTE — Discharge Instructions (Addendum)
It was very nice seeing you today in clinic. Thank you for entrusting me with your care.   Keep area clean and dry. Apply antibiotic ointment TWICE daily. Use oral antibiotics as prescribed. Monitor for signs and symptoms of infection, which would include increased redness, swelling, streaking, drainage, pain, and the development of a fever. May use Tylenol and/or Ibuprofen as needed for pain. Apply cool/ice compresses to help reduce pain and swelling.   Make arrangements to follow up with your regular doctor in 1 week for re-evaluation if not improving. If your symptoms/condition worsens, please seek follow up care either here or in the ER. Please remember, our [facility] providers are "right here with you" when you need [place].   Again, it was my pleasure to take care of you today. Thank you for choosing our clinic. I hope that you start to feel better quickly.   Quentin Mulling, MSN, APRN, FNP-C, CEN Advanced Practice Provider [facility] [place] [facility]

## 2019-05-20 NOTE — ED Provider Notes (Addendum)
[place]   Name: Scott Cohen DOB: [birth year] MRN: [id number] CSN: [id number] PCP: Dale Franklin, MD  Arrival date and time:  05/18/19 1407  Chief Complaint:  Animal Bite  NOTE: Prior to seeing the patient today, I have reviewed the triage nursing documentation and vital signs. Clinical staff has updated patient's PMH/PSHx, current medication list, and drug allergies/intolerances to ensure comprehensive history available to assist in medical decision making.   History:   HPI: Scott Cohen is a 69 y.o. male who presents today with complaints of being bitten back of his RIGHT thigh by a dog at around 1215 PM today. Patient notes that he was bitten by a lab/boxer mix that did not belong to him. 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 He reports that the owner of the animal advised him that the dog was UTD on all of the vaccinations required by the state of [place]. Patient was bitten through his jeans and patient notes that he does not believe that there were any puncture wounds. He states, "the dog latched onto me and would not let go". Site of animal bite reported to be bruised, raised, and significantly swollen. Due to the acute nature of the events leading to today's urgent care visit, patient has not taken any over the counter interventions to help with his pain. Tetanus vaccination status reviewed with patient. Based on his reports, it is determined that he is not up to date on his tetanus prophylaxis. Due to the nature of his injury, the patient will need to have a tetanus injection today while in clinic.  Past Medical History:  Diagnosis Date  . Hypercholesterolemia   . Hypertension   . Leukopenia    benign cyclical    Past Surgical History:  Procedure Laterality Date  . WISDOM TOOTH EXTRACTION      Family History  Problem Relation Age of Onset  . Heart disease Mother        pacemaker  . Diabetes Mother   . Heart disease Father        myocardial infarction    . Hypertension Father   . Diabetes Father   . CVA Father   . Colon cancer Neg Hx   . Prostate cancer Neg Hx     Social History   Tobacco Use  . Smoking status: Never Smoker  . Smokeless tobacco: Never Used  Substance Use Topics  . Alcohol use: Yes    Alcohol/week: 0.0 standard drinks    Comment: 2-3 beers daily  . Drug use: No    Patient Active Problem List   Diagnosis Date Noted  . Shoulder pain, left 04/19/2019  . Right knee pain 10/21/2018  . GERD (gastroesophageal reflux disease) 01/09/2017  . Stress 10/02/2016  . Sleeping difficulty 11/10/2015  . Left-sided chest wall pain 07/11/2015  . H/O syncope 07/10/2015  . Healthcare maintenance 07/13/2014  . Vitamin D deficiency, unspecified 03/12/2013  . Essential (primary) hypertension 02/09/2012  . Pure hypercholesterolemia 02/09/2012  . Decreased white blood cell count 02/09/2012    Home Medications:    Current Meds  Medication Sig  . amLODipine (NORVASC) 2.5 MG tablet Take 1 tablet (2.5 mg total) by mouth daily.  Marland Kitchen aspirin EC 81 MG tablet Take 81 mg by mouth daily.  Marland Kitchen losartan (COZAAR) 100 MG tablet TAKE 1 TABLET BY MOUTH DAILY. GENERIC EQUIVALENT FOR COZAAR  . pantoprazole (PROTONIX) 40 MG tablet Take 1 tablet (40 mg total) by mouth daily.  . rosuvastatin (  CRESTOR) 10 MG tablet Take 1 tablet (10 mg total) by mouth daily.    Allergies:   Patient has no known allergies.  Review of Systems (ROS):  Review of systems NEGATIVE unless otherwise noted in narrative H&P section.   Vital Signs: Today's Vitals   05/18/19 1435 05/18/19 1441  BP:  (!) 145/93  Pulse:  83  Resp:  18  Temp:  98.2 F (36.8 C)  TempSrc:  Oral  SpO2:  98%  Weight: 160 lb (72.6 kg)   Height: 6' (1.829 m)   PainSc: 5      Physical Exam: Constitutional: He is oriented to person, place, and time. He appears well-developed and well-nourished.  HENT:  Head: Normocephalic and atraumatic.  Eyes: Pupils are equal, round, and reactive to  light. EOM are normal. No scleral icterus.  Neck: No tracheal deviation present.  Cardiovascular: Normal rate and intact distal pulses.  Pulmonary/Chest: Effort normal. No respiratory distress.  Musculoskeletal:     Cervical back: Normal range of motion.     Right upper leg: Swelling and tenderness present.       Legs:    Neurological: He is alert and oriented to person, place, and time.  Skin: Skin is warm and dry. No rash noted.  Psychiatric: His speech is normal and behavior is normal. Judgment normal. His mood appears anxious.  Nursing note and vitals reviewed.   Urgent Care Treatments / Results:   Orders Placed This Encounter  Procedures  . Apply dressing    LABS: PLEASE NOTE: all labs that were ordered this encounter are listed, however only abnormal results are displayed. Labs Reviewed - No data to display  EKG: -None  RADIOLOGY: No results found.  PROCEDURES: Procedures  MEDICATIONS RECEIVED THIS VISIT: Medications  Tdap (BOOSTRIX) injection 0.5 mL (0.5 mLs Intramuscular Given 05/18/19 1458)  bacitracin ointment ( Topical Given 05/18/19 1515)    PERTINENT CLINICAL COURSE NOTES/UPDATES:   Initial Impression / Assessment and Plan / Urgent Care Course:  Pertinent labs & imaging results that were available during my care of the patient were personally reviewed by me and considered in my medical decision making (see lab/imaging section of note for values and interpretations).  [name] is a 69 y.o. male who presents to [facility] today with complaints of Animal Bite  Patient is well appearing overall in clinic today. He does not appear to be in any acute distress. Presenting symptoms (see HPI) and exam as documented above. The animal implicated in today's injury is not owned by patient (owner: Normajean Baxter - [phone]). Patient advises that he is unsure if the animal lives inside or outside, however he notes that the owner told him that dog is UTD on  all recommended vaccinations. Patient is not UTD on tetanus prophylaxis; TDAP given in clinic today. The location of the animal is currently known to the patient.   Exam revealed 3 puncture wounds to the posterior aspect of the RIGHT thigh (see medical photo). Areas with peri-wound erythema, extensive ecchymosis, and significant swelling. There is no evidence of lymphangitic spread. This is sanguinous drainage noted. Affected areas is tender to palpation. Wound cleansed and dressing in clinic today by nursing staff; bacitracin applied. Reviewed that the most commonly associated bacteria species are Pasturella multocida, Staphylococcus aureus, and various other anaerobes.    Allergies reviewed. Patient has no known allergy to PCN. Will follow first line treatment recommendations. Given the extent and appearance of the wound, will prescribe a  7 day prophylactic course of amoxicillin-clavulanate 875 mg BID, in addition to topical mupirocin BID.    Patient educated on need to keep wounds clean and dry. Patient to monitor for signs of infection, which would include increased redness, swelling, streaking, drainage, pain, and the development of a fever, and seek prompt follow up care should he develop concerns for developing infection.   Recommended cool/ice compresses to help reduce pain and swelling.   May use Tylenol and/or Ibuprofen as needed for pain.  Discussed follow up with primary care physician in 1 week for re-evaluation. I have reviewed the follow up and strict return precautions for any new or worsening symptoms. Patient is aware of symptoms that would be deemed urgent/emergent, and would thus require further evaluation either here or in the emergency department. At the time of discharge, he verbalized understanding and consent with the discharge plan as it was reviewed with him. All questions were fielded by provider and/or clinic staff prior to patient discharge.    Final Clinical  Impressions / Urgent Care Diagnoses:   Final diagnoses:  Dog bite of right thigh, initial encounter  Need for prophylactic vaccination using diphtheria, tetanus, and acellular pertussis (DTaP) vaccine    New Prescriptions:  [place] Controlled Substance Registry consulted? Not Applicable  Meds ordered this encounter  Medications  . Tdap (BOOSTRIX) injection 0.5 mL  . bacitracin ointment  . amoxicillin-clavulanate (AUGMENTIN) 875-125 MG tablet    Sig: Take 1 tablet by mouth 2 (two) times daily for 7 days.    Dispense:  14 tablet    Refill:  0  . mupirocin ointment (BACTROBAN) 2 %    Sig: AAA BID x 5 days.    Dispense:  30 g    Refill:  0    Recommended Follow up Care:  Patient encouraged to follow up with the following provider within the specified time frame, or sooner as dictated by the severity of his symptoms. As always, he was instructed that for any urgent/emergent care needs, he should seek care either here or in the emergency department for more immediate evaluation.  Follow-up Information    [place], MD In 1 week.   Specialty: Internal Medicine Why: General reassessment of symptoms if not improving Contact information: [address] [place] [address] [phone]         NOTE: This note was prepared using Dragon dictation software along with smaller phrase technology. Despite my best ability to proofread, there is the potential that transcriptional errors may still occur from this process, and are completely unintentional.    Verlee Monte, NP 05/20/19 1455

## 2019-05-21 ENCOUNTER — Encounter: Payer: Self-pay | Admitting: Internal Medicine

## 2019-05-21 NOTE — Telephone Encounter (Signed)
The abx should not affect the labs that are scheduled, but I am fine if he prefers to extend out a few weeks.

## 2019-05-22 ENCOUNTER — Encounter: Payer: Self-pay | Admitting: Internal Medicine

## 2019-05-22 ENCOUNTER — Other Ambulatory Visit (INDEPENDENT_AMBULATORY_CARE_PROVIDER_SITE_OTHER): Payer: BC Managed Care – PPO

## 2019-05-22 ENCOUNTER — Other Ambulatory Visit: Payer: Self-pay

## 2019-05-22 DIAGNOSIS — I10 Essential (primary) hypertension: Secondary | ICD-10-CM | POA: Diagnosis not present

## 2019-05-22 DIAGNOSIS — E7800 Pure hypercholesterolemia, unspecified: Secondary | ICD-10-CM | POA: Diagnosis not present

## 2019-05-22 LAB — LIPID PANEL
Cholesterol: 148 mg/dL (ref 0–200)
HDL: 71.1 mg/dL (ref >39.00)
LDL Cholesterol: 64 mg/dL (ref 0–99)
NonHDL: 77.09
Total CHOL/HDL Ratio: 2
Triglycerides: 64 mg/dL (ref 0.0–149.0)
VLDL: 12.8 mg/dL (ref 0.0–40.0)

## 2019-05-22 LAB — BASIC METABOLIC PANEL
BUN: 12 mg/dL (ref 6–23)
CO2: 28 mEq/L (ref 19–32)
Calcium: 8.9 mg/dL (ref 8.4–10.5)
Chloride: 98 mEq/L (ref 96–112)
Creatinine, Ser: 0.86 mg/dL (ref 0.40–1.50)
GFR: 88.25 mL/min (ref >60.00)
Glucose, Bld: 98 mg/dL (ref 70–99)
Potassium: 4.2 mEq/L (ref 3.5–5.1)
Sodium: 133 mEq/L — ABNORMAL LOW (ref 135–145)

## 2019-05-22 LAB — HEPATIC FUNCTION PANEL
ALT: 20 U/L (ref 0–53)
AST: 21 U/L (ref 0–37)
Albumin: 4.3 g/dL (ref 3.5–5.2)
Alkaline Phosphatase: 62 U/L (ref 39–117)
Bilirubin, Direct: 0.1 mg/dL (ref 0.0–0.3)
Total Bilirubin: 0.5 mg/dL (ref 0.2–1.2)
Total Protein: 6.8 g/dL (ref 6.0–8.3)

## 2019-05-23 ENCOUNTER — Other Ambulatory Visit: Payer: Self-pay

## 2019-05-23 ENCOUNTER — Ambulatory Visit: Payer: BC Managed Care – PPO | Admitting: Nurse Practitioner

## 2019-05-23 ENCOUNTER — Encounter: Payer: Self-pay | Admitting: Nurse Practitioner

## 2019-05-23 VITALS — BP 116/64 | HR 74 | Temp 96.7°F | Ht 72.0 in | Wt 164.8 lb

## 2019-05-23 DIAGNOSIS — S71151D Open bite, right thigh, subsequent encounter: Secondary | ICD-10-CM | POA: Diagnosis not present

## 2019-05-23 DIAGNOSIS — W540XXD Bitten by dog, subsequent encounter: Secondary | ICD-10-CM

## 2019-05-23 MED ORDER — PANTOPRAZOLE SODIUM 40 MG PO TBEC: 40.0000 mg | DELAYED_RELEASE_TABLET | Freq: Every day | ORAL | 1 refills | Status: DC

## 2019-05-23 NOTE — Progress Notes (Addendum)
Established Patient Office Visit  Subjective:  Patient ID: Scott Cohen, male    DOB: 07-03-1950  Age: 69 y.o. MRN: [id number]  CC:  Chief Complaint  Patient presents with  . Follow-up    dog bite    HPI Scott Cohen presents for follow up of dog bite wound of the right posterior upper thigh treated in the ED on 05/18/2019. He reports he has been taking the Augmentin and applying the Bactroban as directed.  He says the bruising has increased, and it still swollen.  He went to work for a full day yesterday and that seemed to make the swelling worse.  At times he feels there is muscle soreness but he is ambulating without difficulty.  He has limped a few times.  No fevers or chills, does not believe the wound is infected.  Not having any pain right now.  He was taking ibuprofen 2-3 times a day as needed and that was adequate.  He is tolerating the Augmentin very well without stomach upset or diarrhea.  He had no problems with the tetanus injection site.  Past Medical History:  Diagnosis Date  . Hypercholesterolemia   . Hypertension   . Leukopenia    benign cyclical    Past Surgical History:  Procedure Laterality Date  . WISDOM TOOTH EXTRACTION      Family History  Problem Relation Age of Onset  . Heart disease Mother        pacemaker  . Diabetes Mother   . Heart disease Father        myocardial infarction  . Hypertension Father   . Diabetes Father   . CVA Father   . Colon cancer Neg Hx   . Prostate cancer Neg Hx     Social History   Socioeconomic History  . Marital status: Married    Spouse name: Not on file  . Number of children: Not on file  . Years of education: Not on file  . Highest education level: Not on file  Occupational History    Employer: [employer]  Tobacco Use  . Smoking status: Never Smoker  . Smokeless tobacco: Never Used  Substance and Sexual Activity  . Alcohol use: Yes    Alcohol/week: 0.0 standard drinks    Comment: 2-3 beers daily   . Drug use: No  . Sexual activity: Not on file  Other Topics Concern  . Not on file  Social History Narrative  . Not on file   Social Determinants of Health   Financial Resource Strain:   . Difficulty of Paying Living Expenses:   Food Insecurity:   . Worried About Charity fundraiser in the Last Year:   . Arboriculturist in the Last Year:   Transportation Needs:   . Film/video editor (Medical):   Marland Kitchen Lack of Transportation (Non-Medical):   Physical Activity:   . Days of Exercise per Week:   . Minutes of Exercise per Session:   Stress:   . Feeling of Stress :   Social Connections:   . Frequency of Communication with Friends and Family:   . Frequency of Social Gatherings with Friends and Family:   . Attends Religious Services:   . Active Member of Clubs or Organizations:   . Attends Archivist Meetings:   Marland Kitchen Marital Status:   Intimate Partner Violence:   . Fear of Current or Ex-Partner:   . Emotionally Abused:   Marland Kitchen Physically Abused:   .  Sexually Abused:     Outpatient Medications Prior to Visit  Medication Sig Dispense Refill  . amLODipine (NORVASC) 2.5 MG tablet Take 1 tablet (2.5 mg total) by mouth daily. 90 tablet 1  . amoxicillin-clavulanate (AUGMENTIN) 875-125 MG tablet Take 1 tablet by mouth 2 (two) times daily for 7 days. 14 tablet 0  . aspirin EC 81 MG tablet Take 81 mg by mouth daily.    Marland Kitchen losartan (COZAAR) 100 MG tablet TAKE 1 TABLET BY MOUTH DAILY. GENERIC EQUIVALENT FOR COZAAR 90 tablet 2  . mupirocin ointment (BACTROBAN) 2 % AAA BID x 5 days. 30 g 0  . rosuvastatin (CRESTOR) 10 MG tablet Take 1 tablet (10 mg total) by mouth daily. 90 tablet 3  . pantoprazole (PROTONIX) 40 MG tablet Take 1 tablet (40 mg total) by mouth daily. 90 tablet 1   No facility-administered medications prior to visit.    No Known Allergies   Review of Systems Pertinent positives as noted in history of present illness otherwise negative.   Objective:    Physical  Exam  Constitutional: He is oriented to person, place, and time. He appears well-developed and well-nourished.  He is relaxed and in NAD.   HENT:  Head: Normocephalic and atraumatic.  Musculoskeletal:     Right upper leg: Swelling and tenderness present.       Legs:     Comments: Site of the animal bite shows 3 puncture wounds healing without signs of infection. No drainage. The bite wound area is less raised, but the peri-wound area of the thigh remains swollen and wound area remains tender to palpation. Extensive ecchymoses is now present at the anterior and posterior upper leg distal past the posterior knee. The site is re-dressed with non adhesive dressing.  Distal pulses DP and PT are very strong. Color, temperature, sensation,  and movement are normal. He is sitting on distal end of table and moving /ambulating without difficulty.   Neurological: He is alert and oriented to person, place, and time.  Skin: Skin is warm and dry.  Vitals reviewed.      BP 116/64   Pulse 74   Temp (!) 96.7 F (35.9 C) (Temporal)   Ht 6' (1.829 m)   Wt 164 lb 12.8 oz (74.8 kg)   SpO2 99%   BMI 22.35 kg/m  Wt Readings from Last 3 Encounters:  05/23/19 164 lb 12.8 oz (74.8 kg)  05/18/19 160 lb (72.6 kg)  04/19/19 162 lb 12.8 oz (73.8 kg)       Assessment & Plan:   Problem List Items Addressed This Visit    None    Visit Diagnoses    Dog bite of right thigh, subsequent encounter    -  Primary     There is no sign of infection.  Bruising is to be expected. Your wound is healing and it will take time to absorb the blood and for the bruising to resolve. Dr. Lorin Picket in to examine wound and agrees with plan of care.   Continue to monitor for any signs of infection.  Monitor for increased leg swelling or tenseness.  Finish the Augmentin course.  Continue to place the Bactroban ointment under the dressing.  Begin Florastor 1 to 2/day for added probiotic to help prevent any diarrhea or GI side  effects from Augmentin.  No orders of the defined types were placed in this encounter.   Follow-up: Return in about 11 days (around 06/03/2019).    Amedeo Kinsman, NP

## 2019-05-23 NOTE — Patient Instructions (Addendum)
It was very nice he to meet you today.  You are  taking good care of this dog bite wound. There is no sign of infection.  Bruising is to be expected. Your wound is healing and it will take time to absorb the blood and for the bruising to resolve.    Continue to monitor for any signs of infection.  Monitor for increased leg swelling or tenseness.  Finish the Augmentin course.  Continue to place the Bactroban ointment under the dressing.    Begin Florastor 1 to 2/day for added probiotic to help prevent any diarrhea or GI side effects from Augmentin.  Please follow-up in about  10 days.

## 2019-06-03 ENCOUNTER — Ambulatory Visit: Payer: BC Managed Care – PPO | Admitting: Nurse Practitioner

## 2019-06-03 ENCOUNTER — Other Ambulatory Visit: Payer: Self-pay

## 2019-06-03 ENCOUNTER — Telehealth: Payer: Self-pay

## 2019-06-03 ENCOUNTER — Encounter: Payer: Self-pay | Admitting: Nurse Practitioner

## 2019-06-03 VITALS — BP 118/78 | HR 67 | Temp 97.2°F | Ht 72.0 in | Wt 162.0 lb

## 2019-06-03 DIAGNOSIS — W5581XA Bitten by other mammals, initial encounter: Secondary | ICD-10-CM

## 2019-06-03 DIAGNOSIS — S71151D Open bite, right thigh, subsequent encounter: Secondary | ICD-10-CM | POA: Diagnosis not present

## 2019-06-03 NOTE — Patient Instructions (Addendum)
Please see Ortho surgeon for evaluation.  I have placed an urgent  referral and you should get a phone call by tomorrow.  Let me know if you did not get a call by Wed.

## 2019-06-03 NOTE — Progress Notes (Signed)
Established Patient Office Visit  Subjective:  Patient ID: Scott Cohen, male    DOB: 1950/11/28  Age: 69 y.o. MRN: [id number]  CC:  Chief Complaint  Patient presents with  . Follow-up    dog bite    HPI RYEN RHAMES presents for follow up of dog bit posterior right thigh DOI 05/18/2019. He had f/up 05/23/2019 on Augmentin and Bactroban topical with bruising,  3 puncture wounds, and small swelling at bite site.  He had generalized thigh and distal leg swelling.  He was given tetanus booster. See photos.    Currently, the bruising, wounds  and leg swelling have resolved. The bite site is healed, but the swelling has enlarged and become very firm.  The open wounds have fully resolved.  It is tender if hit against the chair or if he strikes it against anything. He is walking normally and is not noticing muscle pain with movement.  He typically takes ibuprofen 200 mg in the morning and sometimes in the afternoon for just generalized aches and pains unrelated to this dog bite. He takes ASA 81 mg daily. He does not feel like he needs any more pain medication for this as it is not painful.  He has noted no redness, erythema, or constitutional signs of fevers or chills.    Past Medical History:  Diagnosis Date  . Hypercholesterolemia   . Hypertension   . Leukopenia    benign cyclical    Past Surgical History:  Procedure Laterality Date  . WISDOM TOOTH EXTRACTION      Family History  Problem Relation Age of Onset  . Heart disease Mother        pacemaker  . Diabetes Mother   . Heart disease Father        myocardial infarction  . Hypertension Father   . Diabetes Father   . CVA Father   . Colon cancer Neg Hx   . Prostate cancer Neg Hx     Social History   Socioeconomic History  . Marital status: Married    Spouse name: Not on file  . Number of children: Not on file  . Years of education: Not on file  . Highest education level: Not on file  Occupational History    Employer:  [employer]  Tobacco Use  . Smoking status: Never Smoker  . Smokeless tobacco: Never Used  Substance and Sexual Activity  . Alcohol use: Yes    Alcohol/week: 0.0 standard drinks    Comment: 2-3 beers daily  . Drug use: No  . Sexual activity: Not on file  Other Topics Concern  . Not on file  Social History Narrative  . Not on file   Social Determinants of Health   Financial Resource Strain:   . Difficulty of Paying Living Expenses:   Food Insecurity:   . Worried About Programme researcher, broadcasting/film/video in the Last Year:   . Barista in the Last Year:   Transportation Needs:   . Freight forwarder (Medical):   Marland Kitchen Lack of Transportation (Non-Medical):   Physical Activity:   . Days of Exercise per Week:   . Minutes of Exercise per Session:   Stress:   . Feeling of Stress :   Social Connections:   . Frequency of Communication with Friends and Family:   . Frequency of Social Gatherings with Friends and Family:   . Attends Religious Services:   . Active Member of Clubs or Organizations:   .  Attends Archivist Meetings:   Marland Kitchen Marital Status:   Intimate Partner Violence:   . Fear of Current or Ex-Partner:   . Emotionally Abused:   Marland Kitchen Physically Abused:   . Sexually Abused:     Outpatient Medications Prior to Visit  Medication Sig Dispense Refill  . amLODipine (NORVASC) 2.5 MG tablet Take 1 tablet (2.5 mg total) by mouth daily. 90 tablet 1  . aspirin EC 81 MG tablet Take 81 mg by mouth daily.    Marland Kitchen losartan (COZAAR) 100 MG tablet TAKE 1 TABLET BY MOUTH DAILY. GENERIC EQUIVALENT FOR COZAAR 90 tablet 2  . pantoprazole (PROTONIX) 40 MG tablet Take 1 tablet (40 mg total) by mouth daily. 90 tablet 1  . rosuvastatin (CRESTOR) 10 MG tablet Take 1 tablet (10 mg total) by mouth daily. 90 tablet 3  . mupirocin ointment (BACTROBAN) 2 % AAA BID x 5 days. 30 g 0   No facility-administered medications prior to visit.    No Known Allergies  ROS Pertinent positives noted in  history of present illness otherwise negative.    Objective:    Physical Exam  Constitutional: He appears well-developed and well-nourished.  Cardiovascular: Normal rate and regular rhythm.  Pulmonary/Chest: Effort normal and breath sounds normal.  Musculoskeletal:     Comments: Rt posterior thigh is now without bruising and bite wounds are  fully healed. The site is much more enlarged and firm without warmth or erythema. The overall generalized thigh swelling is gone and only slight lower leg swelling is present. No calf enlargement of tenderness. Movement WNL.  Vitals reviewed.   BP 118/78 (BP Location: Left Arm, Patient Position: Sitting, Cuff Size: Small)   Pulse 67   Temp (!) 97.2 F (36.2 C) (Skin)   Ht 6' (1.829 m)   Wt 162 lb (73.5 kg)   SpO2 96%   BMI 21.97 kg/m  Wt Readings from Last 3 Encounters:  06/03/19 162 lb (73.5 kg)  05/23/19 164 lb 12.8 oz (74.8 kg)  05/18/19 160 lb (72.6 kg)       Health Maintenance Due  Topic Date Due  . Hepatitis C Screening  Never done      Assessment & Plan:   Problem List Items Addressed This Visit    None    Visit Diagnoses    Bite wound of thigh, right, subsequent encounter    -  Primary   Relevant Orders   Ambulatory referral to Orthopedic Surgery     Bite wound now presents enlarged, firm to touch, without signs of infection.  Suspect this is a collection of blood/hematoma that will need to be drained.  Dr. Kalman Jewels reviewed photos and said this could also be hernia or muscle defect and recommends orthopedic surgery opinion.  Patient reports no pain unless the area is struck on the back of a chair or he mashes on it hard.  He is ambulating within normal.  We will request an urgent referral.  Follow-up with [place] in primary care in the next 1- 2 weeks, dependent on orthopedics.  No orders of the defined types were placed in this encounter.   Follow-up: Return in about 1 week (around 06/10/2019).   This visit occurred  during the SARS-CoV-2 public health emergency.  Safety protocols were in place, including screening questions prior to the visit, additional usage of staff PPE, and extensive cleaning of exam room while observing appropriate contact time as indicated for disinfecting solutions.   Denice Paradise, NP

## 2019-06-03 NOTE — Telephone Encounter (Signed)
Selena Batten is requesting an urgent referral be placed for patient to see Ortho. Needs to be seen for possible hematoma.

## 2019-06-04 ENCOUNTER — Encounter: Payer: Self-pay | Admitting: Internal Medicine

## 2019-06-04 ENCOUNTER — Other Ambulatory Visit: Payer: Self-pay | Admitting: Internal Medicine

## 2019-06-04 ENCOUNTER — Encounter: Payer: Self-pay | Admitting: Nurse Practitioner

## 2019-06-04 DIAGNOSIS — S71151D Open bite, right thigh, subsequent encounter: Secondary | ICD-10-CM | POA: Insufficient documentation

## 2019-06-04 NOTE — Assessment & Plan Note (Signed)
Bite wound now presents enlarged, firm to touch, without signs of infection.  Suspect this is a collection of blood/hematoma that will need to be drained.  Dr. Patrice Paradise reviewed photos and said this could also be hernia or muscle defect and recommends orthopedic surgery opinion.

## 2019-06-05 ENCOUNTER — Emergency Department: Payer: BC Managed Care – PPO

## 2019-06-05 ENCOUNTER — Other Ambulatory Visit: Payer: Self-pay

## 2019-06-05 ENCOUNTER — Emergency Department
Admission: EM | Admit: 2019-06-05 | Discharge: 2019-06-05 | Disposition: A | Discharge status: Home / Self Care | Payer: BC Managed Care – PPO | Attending: Emergency Medicine | Admitting: Emergency Medicine

## 2019-06-05 ENCOUNTER — Encounter: Payer: Self-pay | Admitting: Emergency Medicine

## 2019-06-05 DIAGNOSIS — Y929 Unspecified place or not applicable: Secondary | ICD-10-CM | POA: Insufficient documentation

## 2019-06-05 DIAGNOSIS — R2241 Localized swelling, mass and lump, right lower limb: Secondary | ICD-10-CM | POA: Diagnosis not present

## 2019-06-05 DIAGNOSIS — W540XXA Bitten by dog, initial encounter: Secondary | ICD-10-CM | POA: Diagnosis not present

## 2019-06-05 DIAGNOSIS — Z79899 Other long term (current) drug therapy: Secondary | ICD-10-CM | POA: Diagnosis not present

## 2019-06-05 DIAGNOSIS — Z7982 Long term (current) use of aspirin: Secondary | ICD-10-CM | POA: Diagnosis not present

## 2019-06-05 DIAGNOSIS — S7011XA Contusion of right thigh, initial encounter: Secondary | ICD-10-CM

## 2019-06-05 DIAGNOSIS — Y939 Activity, unspecified: Secondary | ICD-10-CM | POA: Diagnosis not present

## 2019-06-05 DIAGNOSIS — Y999 Unspecified external cause status: Secondary | ICD-10-CM | POA: Diagnosis not present

## 2019-06-05 DIAGNOSIS — I10 Essential (primary) hypertension: Secondary | ICD-10-CM | POA: Insufficient documentation

## 2019-06-05 DIAGNOSIS — S8991XA Unspecified injury of right lower leg, initial encounter: Secondary | ICD-10-CM | POA: Diagnosis not present

## 2019-06-05 DIAGNOSIS — S71151A Open bite, right thigh, initial encounter: Secondary | ICD-10-CM | POA: Diagnosis not present

## 2019-06-05 NOTE — ED Provider Notes (Signed)
[facility] Emergency Department Provider Note   ____________________________________________   First MD Initiated Contact with Patient 06/05/19 1310     (approximate)  I have reviewed the triage vital signs and the nursing notes.   HISTORY  Chief Complaint No chief complaint on file.    HPI Scott Cohen is a 69 y.o. male patient presents for swollen posterior right thigh secondary to a dog bite on 05/18/2019.  Patient was seen by PCP at urgent care clinic on 2 separate visits.  Patient was prescribed Augmentin twice daily for 7 days.  Patient states wounds have healed but he is concerned about the bullous lesion posterior right thigh.  Patient was seen by orthopedics today and was told to come to emergency room for definitive evaluation and treatment.  Patient state pain increased with prolonged standing and ambulation.  Denies fever/chills or drainage.  Patient rates pain a 7/10.  Patient scribed pain is "aching".  No palliative measure for complaint.         Past Medical History:  Diagnosis Date  . Hypercholesterolemia   . Hypertension   . Leukopenia    benign cyclical    Patient Active Problem List   Diagnosis Date Noted  . Bite wound of thigh, right, subsequent encounter 06/04/2019  . Shoulder pain, left 04/19/2019  . Right knee pain 10/21/2018  . GERD (gastroesophageal reflux disease) 01/09/2017  . Stress 10/02/2016  . Sleeping difficulty 11/10/2015  . Left-sided chest wall pain 07/11/2015  . H/O syncope 07/10/2015  . Healthcare maintenance 07/13/2014  . Vitamin D deficiency, unspecified 03/12/2013  . Essential (primary) hypertension 02/09/2012  . Pure hypercholesterolemia 02/09/2012  . Decreased white blood cell count 02/09/2012    Past Surgical History:  Procedure Laterality Date  . WISDOM TOOTH EXTRACTION      Prior to Admission medications   Medication Sig Start Date End Date Taking? Authorizing Provider  amLODipine (NORVASC)  2.5 MG tablet Take 1 tablet (2.5 mg total) by mouth daily. 03/11/19   Einar Pheasant, MD  aspirin EC 81 MG tablet Take 81 mg by mouth daily.    [provider]  losartan (COZAAR) 100 MG tablet TAKE 1 TABLET BY MOUTH DAILY 06/04/19   Einar Pheasant, MD  mupirocin ointment (BACTROBAN) 2 % AAA BID x 5 days. 05/18/19   Karen Kitchens, NP  pantoprazole (PROTONIX) 40 MG tablet Take 1 tablet (40 mg total) by mouth daily. 05/23/19   Einar Pheasant, MD  rosuvastatin (CRESTOR) 10 MG tablet Take 1 tablet (10 mg total) by mouth daily. 07/24/18   Einar Pheasant, MD    Allergies Patient has no known allergies.  Family History  Problem Relation Age of Onset  . Heart disease Mother        pacemaker  . Diabetes Mother   . Heart disease Father        myocardial infarction  . Hypertension Father   . Diabetes Father   . CVA Father   . Colon cancer Neg Hx   . Prostate cancer Neg Hx     Social History Social History   Tobacco Use  . Smoking status: Never Smoker  . Smokeless tobacco: Never Used  Substance Use Topics  . Alcohol use: Yes    Alcohol/week: 0.0 standard drinks    Comment: 2-3 beers daily  . Drug use: No    Review of Systems  Constitutional: No fever/chills Eyes: No visual changes. ENT: No sore throat. Cardiovascular: Denies chest pain. Respiratory: Denies  shortness of breath. Gastrointestinal: No abdominal pain.  No nausea, no vomiting.  No diarrhea.  No constipation. Genitourinary: Negative for dysuria. Musculoskeletal: Negative for back pain. Skin: Negative for rash.  Nodule lesion posterior right thigh. Neurological: Negative for headaches, focal weakness or numbness. Endocrine:  Hyperlipidemia and hypertension.  ____________________________________________   PHYSICAL EXAM:  VITAL SIGNS: ED Triage Vitals  Enc Vitals Group     BP 06/05/19 1255 (!) 143/85     Pulse Rate 06/05/19 1255 85     Resp 06/05/19 1255 18     Temp 06/05/19 1255 98.2 F (36.8 C)      Temp src --      SpO2 06/05/19 1255 99 %     Weight 06/05/19 1256 162 lb (73.5 kg)     Height 06/05/19 1256 5\' 7"  (1.702 m)     Head Circumference --      Peak Flow --      Pain Score 06/05/19 1256 7     Pain Loc --      Pain Edu? --      Excl. in GC? --     Constitutional: Alert and oriented. Well appearing and in no acute distress. Cardiovascular: Normal rate, regular rhythm. Grossly normal heart sounds.  Good peripheral circulation. Respiratory: Normal respiratory effort.  No retractions. Lungs CTAB. Musculoskeletal: No lower extremity tenderness nor edema.  No joint effusions. Neurologic:  Normal speech and language. No gross focal neurologic deficits are appreciated. No gait instability. Skin:  Skin is warm, dry and intact.  Nodule lesion posterior right thigh measuring 5 x 5 cm. Psychiatric: Mood and affect are normal. Speech and behavior are normal.  ____________________________________________   LABS (all labs ordered are listed, but only abnormal results are displayed)  Labs Reviewed - No data to display ____________________________________________  EKG   ____________________________________________  RADIOLOGY  ED MD interpretation:    Official radiology report(s): 08/05/19 Extrem Low Right Ltd  Result Date: 06/05/2019 CLINICAL DATA:  Bulbous lesion posterior RIGHT thigh. Status post dog bite 3 weeks ago. EXAM: ULTRASOUND RIGHT LOWER EXTREMITY LIMITED TECHNIQUE: Ultrasound examination of the lower extremity soft tissues was performed in the area of clinical concern. COMPARISON:  None. FINDINGS: Complex collection within the RIGHT posterior-lateral thigh, measuring 5.3 x 2.2 x 4.5 cm, without internal vascularity, most likely hematoma. No surround hypervascularity to suggest abscess. IMPRESSION: Probable hematoma within the soft tissues of the RIGHT posterior-lateral thigh, measuring 5.3 cm greatest dimension, much less likely to represent abscess. Electronically Signed   By:  08/05/2019 M.D.   On: 06/05/2019 14:14    ____________________________________________   PROCEDURES  Procedure(s) performed (including Critical Care):  Procedures   ____________________________________________   INITIAL IMPRESSION / ASSESSMENT AND PLAN / ED COURSE  As part of my medical decision making, I reviewed the following data within the electronic MEDICAL RECORD NUMBER     Patient presents with nonerythematous nodular lesion posterior right thigh in the area of a dog bite from 05/18/2019.  Area is fluctuant with mild guarding palpation.  Further evaluation with ultrasound is warranted.   Discussed ultrasound findings with patient.  Patient given discharge care instruction.  Patient right thigh was Ace wrapped prior to departure.  Patient advised follow-up PCP in 1 week.  Return to ED if condition worsens.  Scott Cohen was evaluated in Emergency Department on 06/05/2019 for the symptoms described in the history of present illness. He was evaluated in the context of the global COVID-19 pandemic, which necessitated  consideration that the patient might be at risk for infection with the SARS-CoV-2 virus that causes COVID-19. Institutional protocols and algorithms that pertain to the evaluation of patients at risk for COVID-19 are in a state of rapid change based on information released by regulatory bodies including the CDC and federal and state organizations. These policies and algorithms were followed during the patient's care in the ED.       ____________________________________________   FINAL CLINICAL IMPRESSION(S) / ED DIAGNOSES  Final diagnoses:  Hematoma of right thigh, initial encounter     ED Discharge Orders    None       Note:  This document was prepared using Dragon voice recognition software and may include unintentional dictation errors.    Joni Reining, PA-C 06/05/19 1425    Emily Filbert, MD 06/05/19 [phone]

## 2019-06-05 NOTE — Discharge Instructions (Signed)
Follow discharge care instructions using heat instead of ice.  Advised 1 week follow-up by urgent care or PCP to gauge resolution.

## 2019-06-05 NOTE — Telephone Encounter (Signed)
Pt was called on 05/03 left vm and called 05/05 no answer sent letter.  I spoke with pt about referral he said [facility] and he was told he could walk into the [place] [facility] if he did not want to wait until next week . He went today. Pt was also told they could not do what the referral Dx said. Pt was told to go to ER and he had a [place] and was told it did not need to be drained he was prescribed a treatment.

## 2019-06-05 NOTE — ED Triage Notes (Signed)
Pt via POV form home. Pt st seen PCP for a dog bite on right posterior leg 4/17- PCP concerned of wound size, swelling and fluid built up, pt referred to [facility].   Pt seen today at [facility] and sent to ED for evaluation due to worsening of bite site swelling. Denies drainage/fever.

## 2019-06-06 ENCOUNTER — Telehealth: Payer: Self-pay | Admitting: Nurse Practitioner

## 2019-06-06 NOTE — Telephone Encounter (Signed)
I called Scott Cohen and left HIPAA compliant voicemail message to call the office.  Please try again and let him know - treatment plan:  Regarding his dog bite hematoma:  I was able to consult with a general surgeon who recommends a heating pad as a hematoma is like a jelly substance that has to slowly soften and get re-absorbed. Heat from a  heating pad placed under the thigh whenever sitting down or laying down.  Heat is very good to help resolve the hematoma.  This is still considered early in the healing process. An ace wrap may not do much- and do not want it tight so that swelling develops under the wrap. Heat is the main treatment option. There was no recommendation at this time for any surgical involvement.  We need to follow this along and he has an appointment with me on 06/13/2019. Thank you.

## 2019-06-07 NOTE — Telephone Encounter (Signed)
Spoke with patient this morning and advised him of below message. Patient states he has been using the heating pad as much as he can when he sits. He states he was advised to do this at his ER visit. Advised patient to give [place] a call if he has any problems and we would see him at his next appt or sooner if we needed to.

## 2019-06-11 ENCOUNTER — Other Ambulatory Visit: Payer: Self-pay

## 2019-06-13 ENCOUNTER — Encounter: Payer: Self-pay | Admitting: Nurse Practitioner

## 2019-06-13 ENCOUNTER — Other Ambulatory Visit: Payer: Self-pay

## 2019-06-13 ENCOUNTER — Ambulatory Visit (INDEPENDENT_AMBULATORY_CARE_PROVIDER_SITE_OTHER): Payer: BC Managed Care – PPO | Admitting: Nurse Practitioner

## 2019-06-13 VITALS — BP 120/78 | HR 72 | Temp 97.4°F | Ht 72.0 in | Wt 163.2 lb

## 2019-06-13 DIAGNOSIS — S71151D Open bite, right thigh, subsequent encounter: Secondary | ICD-10-CM

## 2019-06-13 NOTE — Patient Instructions (Addendum)
Your  hematoma seems a lot softer today, and although it does not show in the picture it does seem to be smaller in size.  I do think that heat is making a difference and this is going to be a slow and gradual resolution of the hematoma.  Continue to use warm heat whenever you are sitting down or laying down, but be careful not to burn yourself. You do not need the Ace wrap as it will cause swelling in the lower leg.   Follow-up office visit in 1 month and will see how you are getting along. If at any time, you feel like this is getting worse or causing the symptoms I will refer you to a general surgeon. I did consult with a general surgeon who recommended heat as the main treatment.

## 2019-06-13 NOTE — Progress Notes (Signed)
Established Patient Office Visit  Subjective:  Patient ID: Scott Cohen, male    DOB: 29-Apr-1950  Age: 69 y.o. MRN: [id number]  CC:  Chief Complaint  Patient presents with  . Follow-up    dog bite/hematoma    HPI Scott Cohen presents for a follow up hematoma of a dog bite. DOI  05/18/2019.  He has been applying heat during the day when he is sitting.  Hematoma is a little softer for the most part and seems to be a little smaller.  It is less tender.  He has noted no new symptoms.  He has been ambulating well.  He has been working every day.  He has applied an Ace wrap and that has created just a little edema in the ankle and foot.  He has stopped using the Ace wrap.  The patient states that overall he is doing well.  Past Medical History:  Diagnosis Date  . Hypercholesterolemia   . Hypertension   . Leukopenia    benign cyclical    Past Surgical History:  Procedure Laterality Date  . WISDOM TOOTH EXTRACTION      Family History  Problem Relation Age of Onset  . Heart disease Mother        pacemaker  . Diabetes Mother   . Heart disease Father        myocardial infarction  . Hypertension Father   . Diabetes Father   . CVA Father   . Colon cancer Neg Hx   . Prostate cancer Neg Hx     Social History   Socioeconomic History  . Marital status: Married    Spouse name: Not on file  . Number of children: Not on file  . Years of education: Not on file  . Highest education level: Not on file  Occupational History    Employer: [employer]  Tobacco Use  . Smoking status: Never Smoker  . Smokeless tobacco: Never Used  Substance and Sexual Activity  . Alcohol use: Yes    Alcohol/week: 0.0 standard drinks    Comment: 2-3 beers daily  . Drug use: No  . Sexual activity: Not on file  Other Topics Concern  . Not on file  Social History Narrative  . Not on file   Social Determinants of Health   Financial Resource Strain:   . Difficulty of Paying Living Expenses:    Food Insecurity:   . Worried About Charity fundraiser in the Last Year:   . Arboriculturist in the Last Year:   Transportation Needs:   . Film/video editor (Medical):   Marland Kitchen Lack of Transportation (Non-Medical):   Physical Activity:   . Days of Exercise per Week:   . Minutes of Exercise per Session:   Stress:   . Feeling of Stress :   Social Connections:   . Frequency of Communication with Friends and Family:   . Frequency of Social Gatherings with Friends and Family:   . Attends Religious Services:   . Active Member of Clubs or Organizations:   . Attends Archivist Meetings:   Marland Kitchen Marital Status:   Intimate Partner Violence:   . Fear of Current or Ex-Partner:   . Emotionally Abused:   Marland Kitchen Physically Abused:   . Sexually Abused:     Outpatient Medications Prior to Visit  Medication Sig Dispense Refill  . amLODipine (NORVASC) 2.5 MG tablet Take 1 tablet (2.5 mg total) by mouth daily. 90 tablet  1  . aspirin EC 81 MG tablet Take 81 mg by mouth daily.    Marland Kitchen losartan (COZAAR) 100 MG tablet TAKE 1 TABLET BY MOUTH DAILY 90 tablet 2  . pantoprazole (PROTONIX) 40 MG tablet Take 1 tablet (40 mg total) by mouth daily. 90 tablet 1  . rosuvastatin (CRESTOR) 10 MG tablet Take 1 tablet (10 mg total) by mouth daily. 90 tablet 3  . mupirocin ointment (BACTROBAN) 2 % AAA BID x 5 days. 30 g 0   No facility-administered medications prior to visit.    No Known Allergies  ROS Review of Systems  Constitutional: Negative for chills and fever.  Musculoskeletal: Negative for gait problem, joint swelling and myalgias.  Skin: Negative for color change and rash.      Objective:    Physical Exam  Constitutional: He appears well-developed and well-nourished.  Musculoskeletal:     Comments: Dog bite site with hematoma- feels softer today. Less tender. Distal circulation intact.   Skin: Skin is warm and dry.  Vitals reviewed.        BP 120/78 (BP Location: Left Arm, Patient  Position: Sitting, Cuff Size: Small)   Pulse 72   Temp (!) 97.4 F (36.3 C) (Skin)   Ht 6' (1.829 m)   Wt 163 lb 3.2 oz (74 kg)   SpO2 99%   BMI 22.13 kg/m  Wt Readings from Last 3 Encounters:  06/13/19 163 lb 3.2 oz (74 kg)  06/05/19 162 lb (73.5 kg)  06/03/19 162 lb (73.5 kg)     Health Maintenance Due  Topic Date Due  . Hepatitis C Screening  Never done    There are no preventive care reminders to display for this patient.  Lab Results  Component Value Date   TSH 1.25 11/30/2018   Lab Results  Component Value Date   WBC 4.7 05/01/2018   HGB 14.2 05/01/2018   HCT 41.0 05/01/2018   MCV 87.6 05/01/2018   PLT 238.0 05/01/2018   Lab Results  Component Value Date   NA 133 (L) 05/22/2019   K 4.2 05/22/2019   CO2 28 05/22/2019   GLUCOSE 98 05/22/2019   BUN 12 05/22/2019   CREATININE 0.86 05/22/2019   BILITOT 0.5 05/22/2019   ALKPHOS 62 05/22/2019   AST 21 05/22/2019   ALT 20 05/22/2019   PROT 6.8 05/22/2019   ALBUMIN 4.3 05/22/2019   CALCIUM 8.9 05/22/2019   ANIONGAP 8 07/09/2015   GFR 88.25 05/22/2019   Lab Results  Component Value Date   CHOL 148 05/22/2019   Lab Results  Component Value Date   HDL 71.10 05/22/2019   Lab Results  Component Value Date   LDLCALC 64 05/22/2019   Lab Results  Component Value Date   TRIG 64.0 05/22/2019   Lab Results  Component Value Date   CHOLHDL 2 05/22/2019   No results found for: HGBA1C    Assessment & Plan:   Problem List Items Addressed This Visit    None     No orders of the defined types were placed in this encounter.  Patient was advised:  Your  hematoma seems a lot softer today, and although it does not show in the picture it does seem to be smaller in size.  I do think that heat is making a difference and this is going to be a slow and gradual resolution of the hematoma.  Continue to use warm heat whenever you are sitting down or laying down, but be careful  not to burn yourself. You do not  need the Ace wrap as it will cause swelling in the lower leg.   Follow-up office visit in 1 month and will see how you are getting along. If at any time, you feel like this is getting worse or causing the symptoms I will refer you to a general surgeon. I did consult with a general surgeon who recommended heat as the main treatment.   Follow-up: Return in about 1 month (around 07/14/2019).   This visit occurred during the SARS-CoV-2 public health emergency.  Safety protocols were in place, including screening questions prior to the visit, additional usage of staff PPE, and extensive cleaning of exam room while observing appro.  Priate contact time as indicated for disinfecting solutions.   Amedeo Kinsman, NP

## 2019-07-27 ENCOUNTER — Other Ambulatory Visit: Payer: Self-pay | Admitting: Internal Medicine

## 2019-07-30 ENCOUNTER — Other Ambulatory Visit: Payer: Self-pay

## 2019-07-30 ENCOUNTER — Ambulatory Visit (INDEPENDENT_AMBULATORY_CARE_PROVIDER_SITE_OTHER): Payer: BC Managed Care – PPO | Admitting: Internal Medicine

## 2019-07-30 VITALS — BP 132/78 | HR 70 | Temp 97.6°F | Resp 16 | Ht 72.0 in | Wt 161.0 lb

## 2019-07-30 DIAGNOSIS — S71151D Open bite, right thigh, subsequent encounter: Secondary | ICD-10-CM

## 2019-07-30 DIAGNOSIS — Z87898 Personal history of other specified conditions: Secondary | ICD-10-CM

## 2019-07-30 DIAGNOSIS — E7800 Pure hypercholesterolemia, unspecified: Secondary | ICD-10-CM

## 2019-07-30 DIAGNOSIS — K219 Gastro-esophageal reflux disease without esophagitis: Secondary | ICD-10-CM

## 2019-07-30 DIAGNOSIS — F439 Reaction to severe stress, unspecified: Secondary | ICD-10-CM

## 2019-07-30 DIAGNOSIS — Z125 Encounter for screening for malignant neoplasm of prostate: Secondary | ICD-10-CM

## 2019-07-30 DIAGNOSIS — I10 Essential (primary) hypertension: Secondary | ICD-10-CM

## 2019-07-30 NOTE — Progress Notes (Signed)
Patient ID: Scott Cohen, male   DOB: 07/10/1950, 69 y.o.   MRN: [id number]   Subjective:    Patient ID: Scott Cohen, male    DOB: [birth year], 69 y.o.   MRN: [id number]  HPI This visit occurred during the SARS-CoV-2 public health emergency.  Safety protocols were in place, including screening questions prior to the visit, additional usage of staff PPE, and extensive cleaning of exam room while observing appropriate contact time as indicated for disinfecting solutions.  Patient here for a scheduled follow up.  He reports he is doing relatively well.  Dog bite 05/18/19.  Seen in ER.  Evaluated here by Fransico Setters.  Healing.  No pain now.  Much improved.  Discussed stress.  Stress at work.  Overall he feels he is handling things relatively well.  No chest pain or sob reported.  No abdominal pain or bowel change reported.  Blood pressure doing well.  Has not been exercising as much.  Overall feels good.     Past Medical History:  Diagnosis Date  . Hypercholesterolemia   . Hypertension   . Leukopenia    benign cyclical   Past Surgical History:  Procedure Laterality Date  . WISDOM TOOTH EXTRACTION     Family History  Problem Relation Age of Onset  . Heart disease Mother        pacemaker  . Diabetes Mother   . Heart disease Father        myocardial infarction  . Hypertension Father   . Diabetes Father   . CVA Father   . Colon cancer Neg Hx   . Prostate cancer Neg Hx    Social History   Socioeconomic History  . Marital status: Married    Spouse name: Not on file  . Number of children: Not on file  . Years of education: Not on file  . Highest education level: Not on file  Occupational History    Employer: [employer]  Tobacco Use  . Smoking status: Never Smoker  . Smokeless tobacco: Never Used  Vaping Use  . Vaping Use: Never used  Substance and Sexual Activity  . Alcohol use: Yes    Alcohol/week: 0.0 standard drinks    Comment: 2-3 beers daily  . Drug use: No  . Sexual  activity: Not on file  Other Topics Concern  . Not on file  Social History Narrative  . Not on file   Social Determinants of Health   Financial Resource Strain:   . Difficulty of Paying Living Expenses:   Food Insecurity:   . Worried About Programme researcher, broadcasting/film/video in the Last Year:   . Barista in the Last Year:   Transportation Needs:   . Freight forwarder (Medical):   Marland Kitchen Lack of Transportation (Non-Medical):   Physical Activity:   . Days of Exercise per Week:   . Minutes of Exercise per Session:   Stress:   . Feeling of Stress :   Social Connections:   . Frequency of Communication with Friends and Family:   . Frequency of Social Gatherings with Friends and Family:   . Attends Religious Services:   . Active Member of Clubs or Organizations:   . Attends Banker Meetings:   Marland Kitchen Marital Status:     Outpatient Encounter Medications as of 07/30/2019  Medication Sig  . amLODipine (NORVASC) 2.5 MG tablet Take 1 tablet (2.5 mg total) by mouth daily.  Marland Kitchen aspirin EC 81 MG  tablet Take 81 mg by mouth daily.  Marland Kitchen losartan (COZAAR) 100 MG tablet TAKE 1 TABLET BY MOUTH DAILY  . pantoprazole (PROTONIX) 40 MG tablet Take 1 tablet (40 mg total) by mouth daily.  . rosuvastatin (CRESTOR) 10 MG tablet TAKE 1 TABLET(10 MG) BY MOUTH DAILY  . [DISCONTINUED] mupirocin ointment (BACTROBAN) 2 % AAA BID x 5 days.   No facility-administered encounter medications on file as of 07/30/2019.    Review of Systems  Constitutional: Negative for appetite change and unexpected weight change.  HENT: Negative for congestion and sinus pressure.   Respiratory: Negative for cough, chest tightness and shortness of breath.   Cardiovascular: Negative for chest pain, palpitations and leg swelling.  Gastrointestinal: Negative for abdominal pain, diarrhea, nausea and vomiting.  Genitourinary: Negative for difficulty urinating and dysuria.  Musculoskeletal: Negative for joint swelling and myalgias.    Skin: Negative for color change and rash.  Neurological: Negative for dizziness, light-headedness and headaches.  Psychiatric/Behavioral: Negative for agitation and dysphoric mood.       Objective:    Physical Exam Vitals reviewed.  Constitutional:      General: He is not in acute distress.    Appearance: Normal appearance. He is well-developed.  HENT:     Head: Normocephalic and atraumatic.     Right Ear: External ear normal.     Left Ear: External ear normal.  Eyes:     General: No scleral icterus.       Right eye: No discharge.        Left eye: No discharge.     Conjunctiva/sclera: Conjunctivae normal.  Cardiovascular:     Rate and Rhythm: Normal rate and regular rhythm.  Pulmonary:     Effort: Pulmonary effort is normal. No respiratory distress.     Breath sounds: Normal breath sounds.  Abdominal:     General: Bowel sounds are normal.     Palpations: Abdomen is soft.     Tenderness: There is no abdominal tenderness.  Musculoskeletal:        General: No swelling or tenderness.     Cervical back: Neck supple. No rigidity.  Lymphadenopathy:     Cervical: No cervical adenopathy.  Skin:    Findings: No erythema or rash.     Comments: Posterior thigh - no open wound.  No increased erythema or warmth.  Resolving hematoma.  No tenderness to palpation.    Neurological:     Mental Status: He is alert.  Psychiatric:        Mood and Affect: Mood normal.        Behavior: Behavior normal.     BP 132/78   Pulse 70   Temp 97.6 F (36.4 C)   Resp 16   Ht 6' (1.829 m)   Wt 161 lb (73 kg)   SpO2 98%   BMI 21.84 kg/m  Wt Readings from Last 3 Encounters:  07/30/19 161 lb (73 kg)  06/13/19 163 lb 3.2 oz (74 kg)  06/05/19 162 lb (73.5 kg)     Lab Results  Component Value Date   WBC 4.7 05/01/2018   HGB 14.2 05/01/2018   HCT 41.0 05/01/2018   PLT 238.0 05/01/2018   GLUCOSE 98 05/22/2019   CHOL 148 05/22/2019   TRIG 64.0 05/22/2019   HDL 71.10 05/22/2019    LDLDIRECT 144.2 08/22/2012   LDLCALC 64 05/22/2019   ALT 20 05/22/2019   AST 21 05/22/2019   NA 133 (L) 05/22/2019   K 4.2 05/22/2019  CL 98 05/22/2019   CREATININE 0.86 05/22/2019   BUN 12 05/22/2019   CO2 28 05/22/2019   TSH 1.25 11/30/2018   PSA 1.00 05/01/2018    [place] Extrem Low Right Ltd  Result Date: 06/05/2019 CLINICAL DATA:  Bulbous lesion posterior RIGHT thigh. Status post dog bite 3 weeks ago. EXAM: ULTRASOUND RIGHT LOWER EXTREMITY LIMITED TECHNIQUE: Ultrasound examination of the lower extremity soft tissues was performed in the area of clinical concern. COMPARISON:  None. FINDINGS: Complex collection within the RIGHT posterior-lateral thigh, measuring 5.3 x 2.2 x 4.5 cm, without internal vascularity, most likely hematoma. No surround hypervascularity to suggest abscess. IMPRESSION: Probable hematoma within the soft tissues of the RIGHT posterior-lateral thigh, measuring 5.3 cm greatest dimension, much less likely to represent abscess. Electronically Signed   By: Bary Richard M.D.   On: 06/05/2019 14:14       Assessment & Plan:   Problem List Items Addressed This Visit    Bite wound of thigh, right, subsequent encounter    Previous dog bite 05/2019.  Healing.  No open wound. No evidence of infection.  Resolving hematoma.  No pain.  Follow.        Essential (primary) hypertension    Blood pressure under good control.  Continues on losartan and amlodipine.  Follow pressures.  Follow metabolic panel.       Relevant Orders   CBC with Differential/Platelet   TSH   Basic metabolic panel   GERD (gastroesophageal reflux disease)    Controlled on protonix.  Follow.       H/O syncope    Evaluated by cardiology previously.  No further syncope.  Had a mild episode where he felt the sensation.  No actual syncope.  Resolved after several minutes.  He has elected to continue to monitor.  Follow.        Pure hypercholesterolemia    On crestor.  Low cholesterol diet and exercise.   Follow lipid panel and liver function tests.        Relevant Orders   Hepatic function panel   Lipid panel   Stress    Dealing with increased stress.  Overall he feels he is handling things relatively well.  Follow.         Other Visit Diagnoses    Prostate cancer screening    -  Primary   Relevant Orders   PSA, Medicare       Dale Pinehill, MD

## 2019-08-05 ENCOUNTER — Encounter: Payer: Self-pay | Admitting: Internal Medicine

## 2019-08-05 NOTE — Assessment & Plan Note (Signed)
Controlled on protonix.  Follow.   

## 2019-08-05 NOTE — Assessment & Plan Note (Signed)
Previous dog bite 05/2019.  Healing.  No open wound. No evidence of infection.  Resolving hematoma.  No pain.  Follow.

## 2019-08-05 NOTE — Assessment & Plan Note (Signed)
On crestor.  Low cholesterol diet and exercise.  Follow lipid panel and liver function tests.   

## 2019-08-05 NOTE — Assessment & Plan Note (Signed)
Evaluated by cardiology previously.  No further syncope.  Had a mild episode where he felt the sensation.  No actual syncope.  Resolved after several minutes.  He has elected to continue to monitor.  Follow.

## 2019-08-05 NOTE — Assessment & Plan Note (Signed)
Blood pressure under good control.  Continues on losartan and amlodipine.  Follow pressures.  Follow metabolic panel.

## 2019-08-05 NOTE — Assessment & Plan Note (Signed)
Dealing with increased stress.  Overall he feels he is handling things relatively well.  Follow.

## 2019-08-07 ENCOUNTER — Encounter: Payer: Self-pay | Admitting: Nurse Practitioner

## 2019-09-04 ENCOUNTER — Other Ambulatory Visit: Payer: Self-pay | Admitting: Internal Medicine

## 2019-10-24 ENCOUNTER — Other Ambulatory Visit (INDEPENDENT_AMBULATORY_CARE_PROVIDER_SITE_OTHER): Payer: BC Managed Care – PPO

## 2019-10-24 ENCOUNTER — Other Ambulatory Visit: Payer: Self-pay

## 2019-10-24 ENCOUNTER — Encounter: Payer: Self-pay | Admitting: Internal Medicine

## 2019-10-24 DIAGNOSIS — I10 Essential (primary) hypertension: Secondary | ICD-10-CM | POA: Diagnosis not present

## 2019-10-24 DIAGNOSIS — E611 Iron deficiency: Secondary | ICD-10-CM

## 2019-10-24 DIAGNOSIS — Z125 Encounter for screening for malignant neoplasm of prostate: Secondary | ICD-10-CM

## 2019-10-24 DIAGNOSIS — E7800 Pure hypercholesterolemia, unspecified: Secondary | ICD-10-CM

## 2019-10-24 LAB — HEPATIC FUNCTION PANEL
ALT: 22 U/L (ref 0–53)
AST: 22 U/L (ref 0–37)
Albumin: 4.6 g/dL (ref 3.5–5.2)
Alkaline Phosphatase: 69 U/L (ref 39–117)
Bilirubin, Direct: 0.1 mg/dL (ref 0.0–0.3)
Total Bilirubin: 0.5 mg/dL (ref 0.2–1.2)
Total Protein: 6.9 g/dL (ref 6.0–8.3)

## 2019-10-24 LAB — LIPID PANEL
Cholesterol: 166 mg/dL (ref 0–200)
HDL: 75.6 mg/dL (ref >39.00)
LDL Cholesterol: 61 mg/dL (ref 0–99)
NonHDL: 90.35
Total CHOL/HDL Ratio: 2
Triglycerides: 148 mg/dL (ref 0.0–149.0)
VLDL: 29.6 mg/dL (ref 0.0–40.0)

## 2019-10-24 LAB — CBC WITH DIFFERENTIAL/PLATELET
Basophils Absolute: 0 10*3/uL (ref 0.0–0.1)
Basophils Relative: 1.2 % (ref 0.0–3.0)
Eosinophils Absolute: 0.1 10*3/uL (ref 0.0–0.7)
Eosinophils Relative: 2.3 % (ref 0.0–5.0)
HCT: 39.4 % (ref 39.0–52.0)
Hemoglobin: 13.6 g/dL (ref 13.0–17.0)
Lymphocytes Relative: 44.3 % (ref 12.0–46.0)
Lymphs Abs: 1.6 10*3/uL (ref 0.7–4.0)
MCHC: 34.4 g/dL (ref 30.0–36.0)
MCV: 89.7 fl (ref 78.0–100.0)
Monocytes Absolute: 0.4 10*3/uL (ref 0.1–1.0)
Monocytes Relative: 12.4 % — ABNORMAL HIGH (ref 3.0–12.0)
Neutro Abs: 1.4 10*3/uL (ref 1.4–7.7)
Neutrophils Relative %: 39.8 % — ABNORMAL LOW (ref 43.0–77.0)
Platelets: 249 10*3/uL (ref 150.0–400.0)
RBC: 4.39 Mil/uL (ref 4.22–5.81)
RDW: 12.7 % (ref 11.5–15.5)
WBC: 3.5 10*3/uL — ABNORMAL LOW (ref 4.0–10.5)

## 2019-10-24 LAB — IBC + FERRITIN
Ferritin: 124.9 ng/mL (ref 22.0–322.0)
Iron: 117 ug/dL (ref 42–165)
Saturation Ratios: 27.3 % (ref 20.0–50.0)
Transferrin: 306 mg/dL (ref 212.0–360.0)

## 2019-10-24 LAB — BASIC METABOLIC PANEL
BUN: 14 mg/dL (ref 6–23)
CO2: 29 mEq/L (ref 19–32)
Calcium: 9.4 mg/dL (ref 8.4–10.5)
Chloride: 98 mEq/L (ref 96–112)
Creatinine, Ser: 0.92 mg/dL (ref 0.40–1.50)
GFR: 81.54 mL/min (ref >60.00)
Glucose, Bld: 84 mg/dL (ref 70–99)
Potassium: 4.6 mEq/L (ref 3.5–5.1)
Sodium: 134 mEq/L — ABNORMAL LOW (ref 135–145)

## 2019-10-24 LAB — TSH: TSH: 1.38 u[IU]/mL (ref 0.35–4.50)

## 2019-10-24 LAB — PSA, MEDICARE: PSA: 1.11 ng/ml (ref 0.10–4.00)

## 2019-10-28 ENCOUNTER — Encounter: Payer: Self-pay | Admitting: Internal Medicine

## 2019-10-28 ENCOUNTER — Ambulatory Visit (INDEPENDENT_AMBULATORY_CARE_PROVIDER_SITE_OTHER): Payer: BC Managed Care – PPO | Admitting: Internal Medicine

## 2019-10-28 ENCOUNTER — Other Ambulatory Visit: Payer: Self-pay

## 2019-10-28 VITALS — BP 114/62 | HR 67 | Temp 97.5°F | Resp 16 | Ht 72.0 in | Wt 157.0 lb

## 2019-10-28 DIAGNOSIS — F439 Reaction to severe stress, unspecified: Secondary | ICD-10-CM

## 2019-10-28 DIAGNOSIS — E7800 Pure hypercholesterolemia, unspecified: Secondary | ICD-10-CM

## 2019-10-28 DIAGNOSIS — E559 Vitamin D deficiency, unspecified: Secondary | ICD-10-CM | POA: Diagnosis not present

## 2019-10-28 DIAGNOSIS — Z0000 Encounter for general adult medical examination without abnormal findings: Secondary | ICD-10-CM

## 2019-10-28 DIAGNOSIS — Z23 Encounter for immunization: Secondary | ICD-10-CM | POA: Diagnosis not present

## 2019-10-28 DIAGNOSIS — Z87898 Personal history of other specified conditions: Secondary | ICD-10-CM

## 2019-10-28 DIAGNOSIS — K219 Gastro-esophageal reflux disease without esophagitis: Secondary | ICD-10-CM

## 2019-10-28 DIAGNOSIS — I10 Essential (primary) hypertension: Secondary | ICD-10-CM

## 2019-10-28 MED ORDER — SERTRALINE HCL 50 MG PO TABS: ORAL_TABLET | ORAL | 1 refills | Status: DC

## 2019-10-28 NOTE — Assessment & Plan Note (Signed)
Physical today 10/28/19.  Colonoscopy 12/2017.  PSA 10/24/19 - 1.11.

## 2019-10-28 NOTE — Progress Notes (Signed)
Patient ID: Scott Cohen, male   DOB: 1950-03-11, 69 y.o.   MRN: [id number]   Subjective:    Patient ID: Scott Cohen, male    DOB: [birth year], 68 y.o.   MRN: [id number]  HPI This visit occurred during the SARS-CoV-2 public health emergency.  Safety protocols were in place, including screening questions prior to the visit, additional usage of staff PPE, and extensive cleaning of exam room while observing appropriate contact time as indicated for disinfecting solutions.  Patient here for his physical exam.  He reports increased stress at work.  Discussed with him today.  Discussed treatment.  Discussed starting SSRI.  Agreeable.  Stays active.  No chest pain or sob with increased activity or exertion.  Has a history of syncopal episodes.  Worked up by cardiology and neurology.  Unknown etiology.  He has had a couple of episodes where he felt like he was going to pass out - but did not.  Is always sitting at his desk at work.  May last 1-2 minutes.  Always gets a warning when going to occur.  Does not occur with driving.  Eating.  No nausea or vomiting.  Bowels moving.  Blood pressure ok.     Past Medical History:  Diagnosis Date  . Hypercholesterolemia   . Hypertension   . Leukopenia    benign cyclical   Past Surgical History:  Procedure Laterality Date  . WISDOM TOOTH EXTRACTION     Family History  Problem Relation Age of Onset  . Heart disease Mother        pacemaker  . Diabetes Mother   . Heart disease Father        myocardial infarction  . Hypertension Father   . Diabetes Father   . CVA Father   . Colon cancer Neg Hx   . Prostate cancer Neg Hx    Social History   Socioeconomic History  . Marital status: Married    Spouse name: Not on file  . Number of children: Not on file  . Years of education: Not on file  . Highest education level: Not on file  Occupational History    Employer: [employer]  Tobacco Use  . Smoking status: Never Smoker  . Smokeless tobacco: Never  Used  Vaping Use  . Vaping Use: Never used  Substance and Sexual Activity  . Alcohol use: Yes    Alcohol/week: 0.0 standard drinks    Comment: 2-3 beers daily  . Drug use: No  . Sexual activity: Not on file  Other Topics Concern  . Not on file  Social History Narrative  . Not on file   Social Determinants of Health   Financial Resource Strain:   . Difficulty of Paying Living Expenses: Not on file  Food Insecurity:   . Worried About Programme researcher, broadcasting/film/video in the Last Year: Not on file  . Ran Out of Food in the Last Year: Not on file  Transportation Needs:   . Lack of Transportation (Medical): Not on file  . Lack of Transportation (Non-Medical): Not on file  Physical Activity:   . Days of Exercise per Week: Not on file  . Minutes of Exercise per Session: Not on file  Stress:   . Feeling of Stress : Not on file  Social Connections:   . Frequency of Communication with Friends and Family: Not on file  . Frequency of Social Gatherings with Friends and Family: Not on file  . Attends Religious Services:  Not on file  . Active Member of Clubs or Organizations: Not on file  . Attends Banker Meetings: Not on file  . Marital Status: Not on file    Outpatient Encounter Medications as of 10/28/2019  Medication Sig  . amLODipine (NORVASC) 2.5 MG tablet TAKE 1 TABLET(2.5 MG) BY MOUTH DAILY  . aspirin EC 81 MG tablet Take 81 mg by mouth daily.  Marland Kitchen losartan (COZAAR) 100 MG tablet TAKE 1 TABLET BY MOUTH DAILY  . pantoprazole (PROTONIX) 40 MG tablet Take 1 tablet (40 mg total) by mouth daily.  . rosuvastatin (CRESTOR) 10 MG tablet TAKE 1 TABLET(10 MG) BY MOUTH DAILY  . sertraline (ZOLOFT) 50 MG tablet Take 1/2 tablet x 2 weeks and then one per day.   No facility-administered encounter medications on file as of 10/28/2019.    Review of Systems  Constitutional: Negative for appetite change and unexpected weight change.  HENT: Negative for congestion, sinus pressure and sore  throat.   Eyes: Negative for pain and visual disturbance.  Respiratory: Negative for cough, chest tightness and shortness of breath.   Cardiovascular: Negative for chest pain, palpitations and leg swelling.  Gastrointestinal: Negative for abdominal pain, diarrhea, nausea and vomiting.  Genitourinary: Negative for difficulty urinating and dysuria.  Musculoskeletal: Negative for joint swelling and myalgias.  Skin: Negative for color change and rash.  Neurological: Negative for dizziness, light-headedness and headaches.  Hematological: Negative for adenopathy. Does not bruise/bleed easily.  Psychiatric/Behavioral: Negative for agitation and dysphoric mood.       Objective:    Physical Exam Vitals reviewed.  Constitutional:      General: He is not in acute distress.    Appearance: Normal appearance. He is well-developed.  HENT:     Head: Normocephalic and atraumatic.     Right Ear: External ear normal.     Left Ear: External ear normal.  Eyes:     General: No scleral icterus.       Right eye: No discharge.        Left eye: No discharge.     Conjunctiva/sclera: Conjunctivae normal.  Neck:     Thyroid: No thyromegaly.  Cardiovascular:     Rate and Rhythm: Normal rate and regular rhythm.  Pulmonary:     Effort: No respiratory distress.     Breath sounds: Normal breath sounds. No wheezing.  Abdominal:     General: Bowel sounds are normal.     Palpations: Abdomen is soft.     Tenderness: There is no abdominal tenderness.  Genitourinary:    Comments: Not performed.  Musculoskeletal:        General: No swelling or tenderness.     Cervical back: Neck supple. No tenderness.  Lymphadenopathy:     Cervical: No cervical adenopathy.  Skin:    Findings: No erythema or rash.  Neurological:     Mental Status: He is alert and oriented to person, place, and time.  Psychiatric:        Mood and Affect: Mood normal.        Behavior: Behavior normal.     BP 114/62   Pulse 67   Temp  (!) 97.5 F (36.4 C) (Oral)   Resp 16   Ht 6' (1.829 m)   Wt 157 lb (71.2 kg)   SpO2 98%   BMI 21.29 kg/m  Wt Readings from Last 3 Encounters:  10/28/19 157 lb (71.2 kg)  07/30/19 161 lb (73 kg)  06/13/19 163 lb 3.2 oz (  74 kg)     Lab Results  Component Value Date   WBC 3.5 (L) 10/24/2019   HGB 13.6 10/24/2019   HCT 39.4 10/24/2019   PLT 249.0 10/24/2019   GLUCOSE 84 10/24/2019   CHOL 166 10/24/2019   TRIG 148.0 10/24/2019   HDL 75.60 10/24/2019   LDLDIRECT 144.2 08/22/2012   LDLCALC 61 10/24/2019   ALT 22 10/24/2019   AST 22 10/24/2019   NA 134 (L) 10/24/2019   K 4.6 10/24/2019   CL 98 10/24/2019   CREATININE 0.92 10/24/2019   BUN 14 10/24/2019   CO2 29 10/24/2019   TSH 1.38 10/24/2019   PSA 1.11 10/24/2019    [place] Extrem Low Right Ltd  Result Date: 06/05/2019 CLINICAL DATA:  Bulbous lesion posterior RIGHT thigh. Status post dog bite 3 weeks ago. EXAM: ULTRASOUND RIGHT LOWER EXTREMITY LIMITED TECHNIQUE: Ultrasound examination of the lower extremity soft tissues was performed in the area of clinical concern. COMPARISON:  None. FINDINGS: Complex collection within the RIGHT posterior-lateral thigh, measuring 5.3 x 2.2 x 4.5 cm, without internal vascularity, most likely hematoma. No surround hypervascularity to suggest abscess. IMPRESSION: Probable hematoma within the soft tissues of the RIGHT posterior-lateral thigh, measuring 5.3 cm greatest dimension, much less likely to represent abscess. Electronically Signed   By: Bary Richard M.D.   On: 06/05/2019 14:14       Assessment & Plan:   Problem List Items Addressed This Visit    Vitamin D deficiency, unspecified    Follow vitamin D level.        Stress    Increased stress as outlined.  Discussed treatment.  Discussed zoloft.  Take as directed.  Follow.  Get him back in soon to reassess.       Pure hypercholesterolemia    On crestor.  Low cholesterol diet and exercise.  Follow lipid panel and liver function tests.     Lab Results  Component Value Date   CHOL 166 10/24/2019   HDL 75.60 10/24/2019   LDLCALC 61 10/24/2019   LDLDIRECT 144.2 08/22/2012   TRIG 148.0 10/24/2019   CHOLHDL 2 10/24/2019        Healthcare maintenance    Physical today 10/28/19.  Colonoscopy 12/2017.  PSA 10/24/19 - 1.11.        H/O syncope    Evaluated by cardiology and neurology previously.  Has not had syncope, but has noticed near syncope.  Was questioning if related to stress.  Treat stress as outlined.  Discussed further w/up.  Wants to monitor.  Follow closely.        GERD (gastroesophageal reflux disease)    No upper symptoms reported. On protonix.       Essential (primary) hypertension    Blood pressure under good control.  Continue amlodipine and losartan.  Follow pressures.  Follow metabolic panel.        Other Visit Diagnoses    Routine general medical examination at a health care facility    -  Primary   Need for immunization against influenza       Relevant Orders   Flu Vaccine QUAD High Dose(Fluad) (Completed)       Dale [place]aa, MD

## 2019-10-29 DIAGNOSIS — Z0000 Encounter for general adult medical examination without abnormal findings: Secondary | ICD-10-CM | POA: Diagnosis not present

## 2019-10-29 DIAGNOSIS — Z23 Encounter for immunization: Secondary | ICD-10-CM | POA: Diagnosis not present

## 2019-10-31 ENCOUNTER — Ambulatory Visit: Payer: BLUE CROSS/BLUE SHIELD

## 2019-10-31 ENCOUNTER — Telehealth: Payer: Self-pay | Admitting: Nurse Practitioner

## 2019-11-10 ENCOUNTER — Encounter: Payer: Self-pay | Admitting: Internal Medicine

## 2019-11-10 NOTE — Assessment & Plan Note (Signed)
On crestor.  Low cholesterol diet and exercise.  Follow lipid panel and liver function tests.   Lab Results  Component Value Date   CHOL 166 10/24/2019   HDL 75.60 10/24/2019   LDLCALC 61 10/24/2019   LDLDIRECT 144.2 08/22/2012   TRIG 148.0 10/24/2019   CHOLHDL 2 10/24/2019

## 2019-11-10 NOTE — Assessment & Plan Note (Signed)
Increased stress as outlined.  Discussed treatment.  Discussed zoloft.  Take as directed.  Follow.  Get him back in soon to reassess.

## 2019-11-10 NOTE — Assessment & Plan Note (Signed)
No upper symptoms reported.  On protonix.   

## 2019-11-10 NOTE — Assessment & Plan Note (Signed)
Evaluated by cardiology and neurology previously.  Has not had syncope, but has noticed near syncope.  Was questioning if related to stress.  Treat stress as outlined.  Discussed further w/up.  Wants to monitor.  Follow closely.

## 2019-11-10 NOTE — Assessment & Plan Note (Signed)
Blood pressure under good control.  Continue amlodipine and losartan.  Follow pressures.  Follow metabolic panel.

## 2019-11-10 NOTE — Assessment & Plan Note (Signed)
Follow vitamin D level.  

## 2019-11-11 ENCOUNTER — Encounter: Payer: Self-pay | Admitting: Internal Medicine

## 2019-11-15 ENCOUNTER — Other Ambulatory Visit: Payer: Self-pay | Admitting: Internal Medicine

## 2019-11-15 MED ORDER — PANTOPRAZOLE SODIUM 40 MG PO TBEC: DELAYED_RELEASE_TABLET | ORAL | 1 refills | Status: DC

## 2019-11-15 MED ORDER — ROSUVASTATIN CALCIUM 10 MG PO TABS: ORAL_TABLET | ORAL | 1 refills | Status: DC

## 2019-11-15 NOTE — Addendum Note (Signed)
Addended by: Hulan Fray on: 11/15/2019 02:39 PM   Modules accepted: Orders

## 2019-12-12 DIAGNOSIS — D225 Melanocytic nevi of trunk: Secondary | ICD-10-CM | POA: Diagnosis not present

## 2019-12-12 DIAGNOSIS — D2262 Melanocytic nevi of left upper limb, including shoulder: Secondary | ICD-10-CM | POA: Diagnosis not present

## 2019-12-12 DIAGNOSIS — D2272 Melanocytic nevi of left lower limb, including hip: Secondary | ICD-10-CM | POA: Diagnosis not present

## 2019-12-12 DIAGNOSIS — D2261 Melanocytic nevi of right upper limb, including shoulder: Secondary | ICD-10-CM | POA: Diagnosis not present

## 2020-01-07 ENCOUNTER — Ambulatory Visit (INDEPENDENT_AMBULATORY_CARE_PROVIDER_SITE_OTHER): Payer: BC Managed Care – PPO | Admitting: Internal Medicine

## 2020-01-07 ENCOUNTER — Other Ambulatory Visit: Payer: Self-pay

## 2020-01-07 ENCOUNTER — Encounter: Payer: Self-pay | Admitting: Internal Medicine

## 2020-01-07 DIAGNOSIS — K219 Gastro-esophageal reflux disease without esophagitis: Secondary | ICD-10-CM | POA: Diagnosis not present

## 2020-01-07 DIAGNOSIS — E559 Vitamin D deficiency, unspecified: Secondary | ICD-10-CM

## 2020-01-07 DIAGNOSIS — I10 Essential (primary) hypertension: Secondary | ICD-10-CM | POA: Diagnosis not present

## 2020-01-07 DIAGNOSIS — E7800 Pure hypercholesterolemia, unspecified: Secondary | ICD-10-CM

## 2020-01-07 DIAGNOSIS — F439 Reaction to severe stress, unspecified: Secondary | ICD-10-CM

## 2020-01-07 DIAGNOSIS — D72819 Decreased white blood cell count, unspecified: Secondary | ICD-10-CM

## 2020-01-07 DIAGNOSIS — S71151D Open bite, right thigh, subsequent encounter: Secondary | ICD-10-CM

## 2020-01-07 DIAGNOSIS — Z87898 Personal history of other specified conditions: Secondary | ICD-10-CM

## 2020-01-07 NOTE — Progress Notes (Signed)
Patient ID: Scott Cohen, male   DOB: May 15, 1950, 68 y.o.   MRN: [id number]   Subjective:    Patient ID: Scott Cohen, male    DOB: 1950/02/21, 69 y.o.   MRN: [id number]  HPI This visit occurred during the SARS-CoV-2 public health emergency.  Safety protocols were in place, including screening questions prior to the visit, additional usage of staff PPE, and extensive cleaning of exam room while observing appropriate contact time as indicated for disinfecting solutions.  Patient here for a scheduled follow up.  Was recently evaluated - increased stress.  Prescribed zoloft.  He is doing better.  Planning to retire.  Has notified his work.  Overall appears to be coping relatively well.  Tries to stay active.  No chest pain or sob reported.  No abdominal pain or bowel change reported.  One week ago, was sitting at his desk at home.  Had the sense of confusion - change in mental focus - similar to his previous episodes.  Will lie down and put his feet up.  Feels a little groggy after.  No LOC.  No problems since.  Has seen neurology and cardiology previously.  Unclear etiology.  Given persistent intermittent episodes, discussed f/u with neurology.  He is in agreement.  Eating well.  No nausea or vomiting.  No bowel change.    Past Medical History:  Diagnosis Date  . Hypercholesterolemia   . Hypertension   . Leukopenia    benign cyclical   Past Surgical History:  Procedure Laterality Date  . WISDOM TOOTH EXTRACTION     Family History  Problem Relation Age of Onset  . Heart disease Mother        pacemaker  . Diabetes Mother   . Heart disease Father        myocardial infarction  . Hypertension Father   . Diabetes Father   . CVA Father   . Colon cancer Neg Hx   . Prostate cancer Neg Hx    Social History   Socioeconomic History  . Marital status: Married    Spouse name: Not on file  . Number of children: Not on file  . Years of education: Not on file  . Highest education level: Not on  file  Occupational History    Employer: [employer]  Tobacco Use  . Smoking status: Never Smoker  . Smokeless tobacco: Never Used  Vaping Use  . Vaping Use: Never used  Substance and Sexual Activity  . Alcohol use: Yes    Alcohol/week: 0.0 standard drinks    Comment: 2-3 beers daily  . Drug use: No  . Sexual activity: Not on file  Other Topics Concern  . Not on file  Social History Narrative  . Not on file   Social Determinants of Health   Financial Resource Strain: Not on file  Food Insecurity: Not on file  Transportation Needs: Not on file  Physical Activity: Not on file  Stress: Not on file  Social Connections: Not on file    Outpatient Encounter Medications as of 01/07/2020  Medication Sig  . amLODipine (NORVASC) 2.5 MG tablet TAKE 1 TABLET(2.5 MG) BY MOUTH DAILY  . aspirin EC 81 MG tablet Take 81 mg by mouth daily.  . fluconazole (DIFLUCAN) 200 MG tablet Take 200 mg by mouth once a week.  . losartan (COZAAR) 100 MG tablet TAKE 1 TABLET BY MOUTH DAILY  . pantoprazole (PROTONIX) 40 MG tablet TAKE 1 TABLET(40 MG) BY MOUTH DAILY  .  rosuvastatin (CRESTOR) 10 MG tablet TAKE 1 TABLET(10 MG) BY MOUTH DAILY  . sertraline (ZOLOFT) 50 MG tablet Take 1/2 tablet x 2 weeks and then one per day. (Patient not taking: Reported on 01/07/2020)   No facility-administered encounter medications on file as of 01/07/2020.    Review of Systems  Constitutional: Negative for appetite change and unexpected weight change.  HENT: Negative for congestion and sinus pressure.   Respiratory: Negative for cough, chest tightness and shortness of breath.   Cardiovascular: Negative for chest pain, palpitations and leg swelling.  Gastrointestinal: Negative for abdominal pain, diarrhea, nausea and vomiting.  Genitourinary: Negative for difficulty urinating and dysuria.  Musculoskeletal: Negative for joint swelling and myalgias.  Skin: Negative for color change and rash.  Neurological: Negative for  dizziness, light-headedness and headaches.  Psychiatric/Behavioral: Negative for agitation and dysphoric mood.       Objective:    Physical Exam Vitals reviewed.  Constitutional:      General: He is not in acute distress.    Appearance: Normal appearance.  HENT:     Head: Normocephalic and atraumatic.     Right Ear: External ear normal.     Left Ear: External ear normal.  Eyes:     General: No scleral icterus.       Right eye: No discharge.        Left eye: No discharge.     Conjunctiva/sclera: Conjunctivae normal.  Cardiovascular:     Rate and Rhythm: Normal rate and regular rhythm.     Pulses: Normal pulses.  Pulmonary:     Effort: Pulmonary effort is normal. No respiratory distress.     Breath sounds: Normal breath sounds.  Abdominal:     General: Bowel sounds are normal.     Palpations: Abdomen is soft.     Tenderness: There is no abdominal tenderness.  Musculoskeletal:        General: No swelling or tenderness.     Cervical back: Neck supple. No tenderness.     Right lower leg: No edema.     Left lower leg: No edema.  Lymphadenopathy:     Cervical: No cervical adenopathy.  Skin:    Findings: No erythema or rash.  Neurological:     Mental Status: He is alert.  Psychiatric:        Mood and Affect: Mood normal.        Behavior: Behavior normal.     BP 124/72   Pulse 72   Temp 97.8 F (36.6 C)   Ht 6' (1.829 m)   Wt 160 lb 6.4 oz (72.8 kg)   SpO2 99%   BMI 21.75 kg/m  Wt Readings from Last 3 Encounters:  01/07/20 160 lb 6.4 oz (72.8 kg)  10/28/19 157 lb (71.2 kg)  07/30/19 161 lb (73 kg)     Lab Results  Component Value Date   WBC 3.5 (L) 10/24/2019   HGB 13.6 10/24/2019   HCT 39.4 10/24/2019   PLT 249.0 10/24/2019   GLUCOSE 84 10/24/2019   CHOL 166 10/24/2019   TRIG 148.0 10/24/2019   HDL 75.60 10/24/2019   LDLDIRECT 144.2 08/22/2012   LDLCALC 61 10/24/2019   ALT 22 10/24/2019   AST 22 10/24/2019   NA 134 (L) 10/24/2019   K 4.6  10/24/2019   CL 98 10/24/2019   CREATININE 0.92 10/24/2019   BUN 14 10/24/2019   CO2 29 10/24/2019   TSH 1.38 10/24/2019   PSA 1.11 10/24/2019    [place]  Extrem Low Right Ltd  Result Date: 06/05/2019 CLINICAL DATA:  Bulbous lesion posterior RIGHT thigh. Status post dog bite 3 weeks ago. EXAM: ULTRASOUND RIGHT LOWER EXTREMITY LIMITED TECHNIQUE: Ultrasound examination of the lower extremity soft tissues was performed in the area of clinical concern. COMPARISON:  None. FINDINGS: Complex collection within the RIGHT posterior-lateral thigh, measuring 5.3 x 2.2 x 4.5 cm, without internal vascularity, most likely hematoma. No surround hypervascularity to suggest abscess. IMPRESSION: Probable hematoma within the soft tissues of the RIGHT posterior-lateral thigh, measuring 5.3 cm greatest dimension, much less likely to represent abscess. Electronically Signed   By: Bary Richard M.D.   On: 06/05/2019 14:14       Assessment & Plan:   Problem List Items Addressed This Visit    Vitamin D deficiency, unspecified    Follow vitamin D level.       Stress    Increased stress.  Prescribed zoloft last visit. Overall appears to be doing better.  Follow.        Pure hypercholesterolemia    On crestor.  Low cholesterol diet and exercise.  Follow lipid panel and liver function tests.        Relevant Orders   Hepatic function panel   Lipid panel   H/O syncope    No actual syncope recently.  Has had continued intermittent episodes as outlined.  If he lies down and puts feet up, feels better.  Symptoms resolve.  Feels groggy after.  Has seen neurology and cardiology previously.  Given persistent intermittent episodes, refer back to neurology for reevaluation.  Pt in agreement.        Relevant Orders   Ambulatory referral to Neurology   GERD (gastroesophageal reflux disease)    No upper symptoms reported.  On protonix.       Essential (primary) hypertension    Blood pressure as outlined.  Continue  amlodipine and losartan.  Follow pressures.  Follow metabolic panel.       Relevant Orders   Basic metabolic panel   Decreased white blood cell count    Recheck cbc.       Relevant Orders   CBC with Differential/Platelet   Bite wound of thigh, right, subsequent encounter    Healed. No open wound.  Resolving hematoma.  Follow.            Dale Worden, MD

## 2020-01-12 ENCOUNTER — Encounter: Payer: Self-pay | Admitting: Internal Medicine

## 2020-01-12 NOTE — Assessment & Plan Note (Signed)
Blood pressure as outlined. Continue amlodipine and losartan.  Follow pressures.  Follow metabolic panel.  

## 2020-01-12 NOTE — Assessment & Plan Note (Signed)
On crestor.  Low cholesterol diet and exercise.  Follow lipid panel and liver function tests.   

## 2020-01-12 NOTE — Assessment & Plan Note (Signed)
Follow vitamin D level.  

## 2020-01-12 NOTE — Assessment & Plan Note (Signed)
No upper symptoms reported.  On protonix.   

## 2020-01-12 NOTE — Assessment & Plan Note (Signed)
No actual syncope recently.  Has had continued intermittent episodes as outlined.  If he lies down and puts feet up, feels better.  Symptoms resolve.  Feels groggy after.  Has seen neurology and cardiology previously.  Given persistent intermittent episodes, refer back to neurology for reevaluation.  Pt in agreement.

## 2020-01-12 NOTE — Assessment & Plan Note (Signed)
Healed. No open wound.  Resolving hematoma.  Follow.

## 2020-01-12 NOTE — Assessment & Plan Note (Signed)
Increased stress.  Prescribed zoloft last visit. Overall appears to be doing better.  Follow.

## 2020-01-12 NOTE — Assessment & Plan Note (Signed)
Recheck cbc.  

## 2020-02-13 ENCOUNTER — Other Ambulatory Visit: Payer: Self-pay | Admitting: Internal Medicine

## 2020-03-19 ENCOUNTER — Telehealth: Payer: Self-pay | Admitting: Internal Medicine

## 2020-03-19 NOTE — Telephone Encounter (Signed)
Rejection Reason - Patient Declined - appt made pt states that he is feeling better and does not need appt" Marrian Salvage said on Mar 18, 2020 5:23 PM  [facility] neurology

## 2020-04-02 ENCOUNTER — Other Ambulatory Visit (INDEPENDENT_AMBULATORY_CARE_PROVIDER_SITE_OTHER): Payer: BC Managed Care – PPO

## 2020-04-02 ENCOUNTER — Other Ambulatory Visit: Payer: Self-pay

## 2020-04-02 DIAGNOSIS — D72819 Decreased white blood cell count, unspecified: Secondary | ICD-10-CM | POA: Diagnosis not present

## 2020-04-02 DIAGNOSIS — I10 Essential (primary) hypertension: Secondary | ICD-10-CM

## 2020-04-02 DIAGNOSIS — E611 Iron deficiency: Secondary | ICD-10-CM

## 2020-04-02 DIAGNOSIS — E7800 Pure hypercholesterolemia, unspecified: Secondary | ICD-10-CM

## 2020-04-02 LAB — BASIC METABOLIC PANEL
BUN: 13 mg/dL (ref 6–23)
CO2: 28 mEq/L (ref 19–32)
Calcium: 9.5 mg/dL (ref 8.4–10.5)
Chloride: 99 mEq/L (ref 96–112)
Creatinine, Ser: 0.83 mg/dL (ref 0.40–1.50)
GFR: 89.27 mL/min (ref >60.00)
Glucose, Bld: 85 mg/dL (ref 70–99)
Potassium: 5 mEq/L (ref 3.5–5.1)
Sodium: 134 mEq/L — ABNORMAL LOW (ref 135–145)

## 2020-04-02 LAB — IBC + FERRITIN
Ferritin: 69.5 ng/mL (ref 22.0–322.0)
Iron: 87 ug/dL (ref 42–165)
Saturation Ratios: 18.7 % — ABNORMAL LOW (ref 20.0–50.0)
Transferrin: 333 mg/dL (ref 212.0–360.0)

## 2020-04-02 LAB — CBC WITH DIFFERENTIAL/PLATELET
Basophils Absolute: 0 10*3/uL (ref 0.0–0.1)
Basophils Relative: 0.9 % (ref 0.0–3.0)
Eosinophils Absolute: 0 10*3/uL (ref 0.0–0.7)
Eosinophils Relative: 0.9 % (ref 0.0–5.0)
HCT: 39.6 % (ref 39.0–52.0)
Hemoglobin: 13.9 g/dL (ref 13.0–17.0)
Lymphocytes Relative: 36.5 % (ref 12.0–46.0)
Lymphs Abs: 1.4 10*3/uL (ref 0.7–4.0)
MCHC: 35 g/dL (ref 30.0–36.0)
MCV: 86.4 fl (ref 78.0–100.0)
Monocytes Absolute: 0.5 10*3/uL (ref 0.1–1.0)
Monocytes Relative: 12.5 % — ABNORMAL HIGH (ref 3.0–12.0)
Neutro Abs: 1.8 10*3/uL (ref 1.4–7.7)
Neutrophils Relative %: 49.2 % (ref 43.0–77.0)
Platelets: 229 10*3/uL (ref 150.0–400.0)
RBC: 4.59 Mil/uL (ref 4.22–5.81)
RDW: 12.6 % (ref 11.5–15.5)
WBC: 3.8 10*3/uL — ABNORMAL LOW (ref 4.0–10.5)

## 2020-04-02 LAB — LIPID PANEL
Cholesterol: 175 mg/dL (ref 0–200)
HDL: 71.3 mg/dL (ref >39.00)
LDL Cholesterol: 81 mg/dL (ref 0–99)
NonHDL: 103.35
Total CHOL/HDL Ratio: 2
Triglycerides: 113 mg/dL (ref 0.0–149.0)
VLDL: 22.6 mg/dL (ref 0.0–40.0)

## 2020-04-02 LAB — HEPATIC FUNCTION PANEL
ALT: 21 U/L (ref 0–53)
AST: 25 U/L (ref 0–37)
Albumin: 4.3 g/dL (ref 3.5–5.2)
Alkaline Phosphatase: 68 U/L (ref 39–117)
Bilirubin, Direct: 0.1 mg/dL (ref 0.0–0.3)
Total Bilirubin: 0.4 mg/dL (ref 0.2–1.2)
Total Protein: 6.7 g/dL (ref 6.0–8.3)

## 2020-04-07 ENCOUNTER — Ambulatory Visit (INDEPENDENT_AMBULATORY_CARE_PROVIDER_SITE_OTHER): Payer: BC Managed Care – PPO | Admitting: Internal Medicine

## 2020-04-07 ENCOUNTER — Other Ambulatory Visit: Payer: Self-pay

## 2020-04-07 DIAGNOSIS — K219 Gastro-esophageal reflux disease without esophagitis: Secondary | ICD-10-CM | POA: Diagnosis not present

## 2020-04-07 DIAGNOSIS — Z87898 Personal history of other specified conditions: Secondary | ICD-10-CM | POA: Diagnosis not present

## 2020-04-07 DIAGNOSIS — E7800 Pure hypercholesterolemia, unspecified: Secondary | ICD-10-CM

## 2020-04-07 DIAGNOSIS — F439 Reaction to severe stress, unspecified: Secondary | ICD-10-CM | POA: Diagnosis not present

## 2020-04-07 DIAGNOSIS — I10 Essential (primary) hypertension: Secondary | ICD-10-CM

## 2020-04-07 NOTE — Progress Notes (Signed)
Patient ID: Scott Cohen, male   DOB: 12/25/1950, 70 y.o.   MRN: [id number]   Subjective:    Patient ID: Scott Cohen, male    DOB: March 05, 1950, 70 y.o.   MRN: [id number]  HPI This visit occurred during the SARS-CoV-2 public health emergency.  Safety protocols were in place, including screening questions prior to the visit, additional usage of staff PPE, and extensive cleaning of exam room while observing appropriate contact time as indicated for disinfecting solutions.  Patient here for a scheduled follow up.  He is here to follow up regarding his blood pressure, cholesterol and f/u increased stress.  Increased stress with work.  Planning to retire. Stays active.  No chest pain or sob reported.  No abdominal pain or bowel change reported.  Had a couple of episodes as outlined in previous notes.  No syncope.  Occurred while working.  Sitting.  Can sense when occurs.  Adjust to control not passing out.  Canceled neurology referral.  Has questions about seeing EP specialist.  Discussed.  Will notify me if desires referral.  Taking zoloft.  Stable.  Blood pressure doing well.     Past Medical History:  Diagnosis Date  . Hypercholesterolemia   . Hypertension   . Leukopenia    benign cyclical   Past Surgical History:  Procedure Laterality Date  . WISDOM TOOTH EXTRACTION     Family History  Problem Relation Age of Onset  . Heart disease Mother        pacemaker  . Diabetes Mother   . Heart disease Father        myocardial infarction  . Hypertension Father   . Diabetes Father   . CVA Father   . Colon cancer Neg Hx   . Prostate cancer Neg Hx    Social History   Socioeconomic History  . Marital status: Married    Spouse name: Not on file  . Number of children: Not on file  . Years of education: Not on file  . Highest education level: Not on file  Occupational History    Employer: [employer]  Tobacco Use  . Smoking status: Never Smoker  . Smokeless tobacco: Never Used  Vaping  Use  . Vaping Use: Never used  Substance and Sexual Activity  . Alcohol use: Yes    Alcohol/week: 0.0 standard drinks    Comment: 2-3 beers daily  . Drug use: No  . Sexual activity: Not on file  Other Topics Concern  . Not on file  Social History Narrative  . Not on file   Social Determinants of Health   Financial Resource Strain: Not on file  Food Insecurity: Not on file  Transportation Needs: Not on file  Physical Activity: Not on file  Stress: Not on file  Social Connections: Not on file    Outpatient Encounter Medications as of 04/07/2020  Medication Sig  . amLODipine (NORVASC) 2.5 MG tablet TAKE 1 TABLET(2.5 MG) BY MOUTH DAILY  . aspirin EC 81 MG tablet Take 81 mg by mouth daily.  . fluconazole (DIFLUCAN) 200 MG tablet Take 200 mg by mouth once a week.  . losartan (COZAAR) 100 MG tablet TAKE 1 TABLET BY MOUTH DAILY  . pantoprazole (PROTONIX) 40 MG tablet TAKE 1 TABLET(40 MG) BY MOUTH DAILY  . rosuvastatin (CRESTOR) 10 MG tablet TAKE 1 TABLET(10 MG) BY MOUTH DAILY  . sertraline (ZOLOFT) 50 MG tablet Take 1/2 tablet x 2 weeks and then one per day.  No facility-administered encounter medications on file as of 04/07/2020.    Review of Systems  Constitutional: Negative for appetite change and unexpected weight change.  HENT: Negative for congestion and sinus pressure.   Respiratory: Negative for cough, chest tightness and shortness of breath.   Cardiovascular: Negative for chest pain, palpitations and leg swelling.  Gastrointestinal: Negative for abdominal pain, diarrhea, nausea and vomiting.  Genitourinary: Negative for difficulty urinating and dysuria.  Musculoskeletal: Negative for joint swelling and myalgias.  Skin: Negative for color change and rash.  Neurological: Negative for dizziness, light-headedness and headaches.  Psychiatric/Behavioral: Negative for agitation and dysphoric mood.       Objective:    Physical Exam Vitals reviewed.  Constitutional:       General: He is not in acute distress.    Appearance: Normal appearance. He is well-developed.  HENT:     Head: Normocephalic and atraumatic.     Right Ear: External ear normal.     Left Ear: External ear normal.  Eyes:     General: No scleral icterus.       Right eye: No discharge.        Left eye: No discharge.     Conjunctiva/sclera: Conjunctivae normal.  Cardiovascular:     Rate and Rhythm: Normal rate and regular rhythm.  Pulmonary:     Effort: Pulmonary effort is normal. No respiratory distress.     Breath sounds: Normal breath sounds.  Abdominal:     General: Bowel sounds are normal.     Palpations: Abdomen is soft.     Tenderness: There is no abdominal tenderness.  Musculoskeletal:        General: No swelling or tenderness.     Cervical back: Neck supple. No tenderness.  Lymphadenopathy:     Cervical: No cervical adenopathy.  Skin:    Findings: No erythema or rash.  Neurological:     Mental Status: He is alert.  Psychiatric:        Mood and Affect: Mood normal.        Behavior: Behavior normal.     BP 122/70   Pulse 76   Temp 97.9 F (36.6 C) (Oral)   Resp 16   Ht 6' (1.829 m)   Wt 159 lb (72.1 kg)   SpO2 99%   BMI 21.56 kg/m  Wt Readings from Last 3 Encounters:  04/07/20 159 lb (72.1 kg)  01/07/20 160 lb 6.4 oz (72.8 kg)  10/28/19 157 lb (71.2 kg)     Lab Results  Component Value Date   WBC 3.8 (L) 04/02/2020   HGB 13.9 04/02/2020   HCT 39.6 04/02/2020   PLT 229.0 04/02/2020   GLUCOSE 85 04/02/2020   CHOL 175 04/02/2020   TRIG 113.0 04/02/2020   HDL 71.30 04/02/2020   LDLDIRECT 144.2 08/22/2012   LDLCALC 81 04/02/2020   ALT 21 04/02/2020   AST 25 04/02/2020   NA 134 (L) 04/02/2020   K 5.0 04/02/2020   CL 99 04/02/2020   CREATININE 0.83 04/02/2020   BUN 13 04/02/2020   CO2 28 04/02/2020   TSH 1.38 10/24/2019   PSA 1.11 10/24/2019    [place] Extrem Low Right Ltd  Result Date: 06/05/2019 CLINICAL DATA:  Bulbous lesion posterior RIGHT thigh.  Status post dog bite 3 weeks ago. EXAM: ULTRASOUND RIGHT LOWER EXTREMITY LIMITED TECHNIQUE: Ultrasound examination of the lower extremity soft tissues was performed in the area of clinical concern. COMPARISON:  None. FINDINGS: Complex collection within the RIGHT posterior-lateral thigh,  measuring 5.3 x 2.2 x 4.5 cm, without internal vascularity, most likely hematoma. No surround hypervascularity to suggest abscess. IMPRESSION: Probable hematoma within the soft tissues of the RIGHT posterior-lateral thigh, measuring 5.3 cm greatest dimension, much less likely to represent abscess. Electronically Signed   By: Bary Richard M.D.   On: 06/05/2019 14:14       Assessment & Plan:   Problem List Items Addressed This Visit    Essential (primary) hypertension    Continue amlodipine and losartan.  Blood pressure dong well.  Follow pressures.  Follow metabolic panel.       Relevant Orders   CBC with Differential/Platelet   Basic metabolic panel   GERD (gastroesophageal reflux disease)    No upper symptoms reported.  On protonix.       H/O syncope    No actual syncopal episodes recently.  Knows maneuvers to prevent syncope (lying down and putting feet up, etc).  Can sense when going to occur.  Discussed further w/up and evaluation.  Had referred back to neurology.  He wanted to hold on neurology referral.  Discussed EP referral.  Will notify me if desires.       Pure hypercholesterolemia    On crestor.  Low cholesterol diet and exercise.  Follow lipid panel and liver function tests.        Relevant Orders   Hepatic function panel   Lipid panel   Basic metabolic panel   Stress    Increased stress with work.  On zoloft.  Stable. Planning to retire.  Follow.           Dale [place], MD

## 2020-04-12 ENCOUNTER — Encounter: Payer: Self-pay | Admitting: Internal Medicine

## 2020-04-12 NOTE — Assessment & Plan Note (Signed)
No upper symptoms reported.  On protonix.   

## 2020-04-12 NOTE — Assessment & Plan Note (Signed)
Continue amlodipine and losartan.  Blood pressure dong well.  Follow pressures.  Follow metabolic panel.  

## 2020-04-12 NOTE — Assessment & Plan Note (Signed)
No actual syncopal episodes recently.  Knows maneuvers to prevent syncope (lying down and putting feet up, etc).  Can sense when going to occur.  Discussed further w/up and evaluation.  Had referred back to neurology.  He wanted to hold on neurology referral.  Discussed EP referral.  Will notify me if desires.

## 2020-04-12 NOTE — Assessment & Plan Note (Signed)
Increased stress with work.  On zoloft.  Stable. Planning to retire.  Follow.

## 2020-04-12 NOTE — Assessment & Plan Note (Signed)
On crestor.  Low cholesterol diet and exercise.  Follow lipid panel and liver function tests.   

## 2020-05-25 ENCOUNTER — Other Ambulatory Visit: Payer: Self-pay | Admitting: Internal Medicine

## 2020-06-12 DIAGNOSIS — B351 Tinea unguium: Secondary | ICD-10-CM | POA: Diagnosis not present

## 2020-06-12 DIAGNOSIS — L821 Other seborrheic keratosis: Secondary | ICD-10-CM | POA: Diagnosis not present

## 2020-08-05 ENCOUNTER — Other Ambulatory Visit: Payer: BC Managed Care – PPO

## 2020-08-07 ENCOUNTER — Ambulatory Visit: Payer: BC Managed Care – PPO | Admitting: Internal Medicine

## 2020-08-10 ENCOUNTER — Other Ambulatory Visit: Payer: Self-pay | Admitting: Internal Medicine

## 2020-08-12 ENCOUNTER — Other Ambulatory Visit: Payer: Self-pay | Admitting: Internal Medicine

## 2020-08-19 ENCOUNTER — Other Ambulatory Visit: Payer: BC Managed Care – PPO

## 2020-08-21 ENCOUNTER — Ambulatory Visit: Payer: BC Managed Care – PPO | Admitting: Internal Medicine

## 2020-09-04 ENCOUNTER — Other Ambulatory Visit (INDEPENDENT_AMBULATORY_CARE_PROVIDER_SITE_OTHER): Payer: Medicare PPO

## 2020-09-04 ENCOUNTER — Other Ambulatory Visit: Payer: Self-pay

## 2020-09-04 ENCOUNTER — Other Ambulatory Visit: Payer: BC Managed Care – PPO

## 2020-09-04 DIAGNOSIS — I10 Essential (primary) hypertension: Secondary | ICD-10-CM

## 2020-09-04 DIAGNOSIS — E7800 Pure hypercholesterolemia, unspecified: Secondary | ICD-10-CM

## 2020-09-04 LAB — CBC WITH DIFFERENTIAL/PLATELET
Basophils Absolute: 0 10*3/uL (ref 0.0–0.1)
Basophils Relative: 0.9 % (ref 0.0–3.0)
Eosinophils Absolute: 0 10*3/uL (ref 0.0–0.7)
Eosinophils Relative: 1.1 % (ref 0.0–5.0)
HCT: 34.5 % — ABNORMAL LOW (ref 39.0–52.0)
Hemoglobin: 11.8 g/dL — ABNORMAL LOW (ref 13.0–17.0)
Lymphocytes Relative: 43.5 % (ref 12.0–46.0)
Lymphs Abs: 1.6 10*3/uL (ref 0.7–4.0)
MCHC: 34.2 g/dL (ref 30.0–36.0)
MCV: 86.7 fl (ref 78.0–100.0)
Monocytes Absolute: 0.4 10*3/uL (ref 0.1–1.0)
Monocytes Relative: 11.6 % (ref 3.0–12.0)
Neutro Abs: 1.6 10*3/uL (ref 1.4–7.7)
Neutrophils Relative %: 42.9 % — ABNORMAL LOW (ref 43.0–77.0)
Platelets: 214 10*3/uL (ref 150.0–400.0)
RBC: 3.98 Mil/uL — ABNORMAL LOW (ref 4.22–5.81)
RDW: 13.5 % (ref 11.5–15.5)
WBC: 3.8 10*3/uL — ABNORMAL LOW (ref 4.0–10.5)

## 2020-09-04 LAB — BASIC METABOLIC PANEL
BUN: 15 mg/dL (ref 6–23)
CO2: 26 mEq/L (ref 19–32)
Calcium: 8.5 mg/dL (ref 8.4–10.5)
Chloride: 97 mEq/L (ref 96–112)
Creatinine, Ser: 1.01 mg/dL (ref 0.40–1.50)
GFR: 75.63 mL/min (ref >60.00)
Glucose, Bld: 88 mg/dL (ref 70–99)
Potassium: 4.3 mEq/L (ref 3.5–5.1)
Sodium: 129 mEq/L — ABNORMAL LOW (ref 135–145)

## 2020-09-04 LAB — HEPATIC FUNCTION PANEL
ALT: 18 U/L (ref 0–53)
AST: 26 U/L (ref 0–37)
Albumin: 4.2 g/dL (ref 3.5–5.2)
Alkaline Phosphatase: 67 U/L (ref 39–117)
Bilirubin, Direct: 0.1 mg/dL (ref 0.0–0.3)
Total Bilirubin: 0.4 mg/dL (ref 0.2–1.2)
Total Protein: 6.7 g/dL (ref 6.0–8.3)

## 2020-09-04 LAB — LIPID PANEL
Cholesterol: 145 mg/dL (ref 0–200)
HDL: 66.5 mg/dL (ref >39.00)
LDL Cholesterol: 63 mg/dL (ref 0–99)
NonHDL: 78.75
Total CHOL/HDL Ratio: 2
Triglycerides: 79 mg/dL (ref 0.0–149.0)
VLDL: 15.8 mg/dL (ref 0.0–40.0)

## 2020-09-07 ENCOUNTER — Encounter: Payer: Self-pay | Admitting: Internal Medicine

## 2020-09-07 ENCOUNTER — Other Ambulatory Visit: Payer: Self-pay

## 2020-09-07 ENCOUNTER — Ambulatory Visit: Payer: BC Managed Care – PPO | Admitting: Internal Medicine

## 2020-09-07 ENCOUNTER — Ambulatory Visit (INDEPENDENT_AMBULATORY_CARE_PROVIDER_SITE_OTHER): Payer: Medicare PPO | Admitting: Internal Medicine

## 2020-09-07 DIAGNOSIS — I10 Essential (primary) hypertension: Secondary | ICD-10-CM

## 2020-09-07 DIAGNOSIS — D649 Anemia, unspecified: Secondary | ICD-10-CM | POA: Diagnosis not present

## 2020-09-07 DIAGNOSIS — E611 Iron deficiency: Secondary | ICD-10-CM | POA: Insufficient documentation

## 2020-09-07 DIAGNOSIS — F439 Reaction to severe stress, unspecified: Secondary | ICD-10-CM

## 2020-09-07 DIAGNOSIS — E871 Hypo-osmolality and hyponatremia: Secondary | ICD-10-CM | POA: Diagnosis not present

## 2020-09-07 DIAGNOSIS — E7800 Pure hypercholesterolemia, unspecified: Secondary | ICD-10-CM

## 2020-09-07 DIAGNOSIS — K219 Gastro-esophageal reflux disease without esophagitis: Secondary | ICD-10-CM | POA: Diagnosis not present

## 2020-09-07 NOTE — Progress Notes (Signed)
Patient ID: Scott Cohen, male   DOB: 03/09/1950, 70 y.o.   MRN: [id number]   Subjective:    Patient ID: Scott Cohen, male    DOB: [birth year], 70 y.o.   MRN: [id number]  HPI This visit occurred during the SARS-CoV-2 public health emergency.  Safety protocols were in place, including screening questions prior to the visit, additional usage of staff PPE, and extensive cleaning of exam room while observing appropriate contact time as indicated for disinfecting solutions.   Patient here for a scheduled follow up.  Here to follow up regarding increased stress, cholesterol and blood pressure.  Has retired.  Stress is better.  Stays active.  No chest pain or sob reported.  No abdominal pain or bowel change reported.  No passing out episodes recently.  Overall feels good.  Taking zoloft. Discussed recent labs.  Sodium 129.  Had discussed decreasing free water intake.  Working outside, sweating, not eating and drinking water.  Also, decreased hgb.  Gave blood this week.      Past Medical History:  Diagnosis Date   Hypercholesterolemia    Hypertension    Leukopenia    benign cyclical   Past Surgical History:  Procedure Laterality Date   WISDOM TOOTH EXTRACTION     Family History  Problem Relation Age of Onset   Heart disease Mother        pacemaker   Diabetes Mother    Heart disease Father        myocardial infarction   Hypertension Father    Diabetes Father    CVA Father    Colon cancer Neg Hx    Prostate cancer Neg Hx    Social History   Socioeconomic History   Marital status: Married    Spouse name: Not on file   Number of children: Not on file   Years of education: Not on file   Highest education level: Not on file  Occupational History    Employer: [employer]  Tobacco Use   Smoking status: Never   Smokeless tobacco: Never  Vaping Use   Vaping Use: Never used  Substance and Sexual Activity   Alcohol use: Yes    Alcohol/week: 0.0 standard drinks    Comment: 2-3  beers daily   Drug use: No   Sexual activity: Not on file  Other Topics Concern   Not on file  Social History Narrative   Not on file   Social Determinants of Health   Financial Resource Strain: Not on file  Food Insecurity: Not on file  Transportation Needs: Not on file  Physical Activity: Not on file  Stress: Not on file  Social Connections: Not on file    Review of Systems  Constitutional:  Negative for appetite change and unexpected weight change.  HENT:  Negative for congestion and sinus pressure.   Respiratory:  Negative for cough, chest tightness and shortness of breath.   Cardiovascular:  Negative for chest pain, palpitations and leg swelling.  Gastrointestinal:  Negative for abdominal pain, diarrhea, nausea and vomiting.  Genitourinary:  Negative for difficulty urinating and dysuria.  Musculoskeletal:  Negative for joint swelling and myalgias.  Skin:  Negative for color change and rash.  Neurological:  Negative for dizziness, light-headedness and headaches.  Psychiatric/Behavioral:  Negative for agitation and dysphoric mood.       Objective:    Physical Exam Constitutional:      General: He is not in acute distress.    Appearance: Normal appearance.  He is well-developed.  HENT:     Head: Normocephalic and atraumatic.     Right Ear: External ear normal.     Left Ear: External ear normal.  Eyes:     General: No scleral icterus.       Right eye: No discharge.        Left eye: No discharge.  Cardiovascular:     Rate and Rhythm: Normal rate and regular rhythm.  Pulmonary:     Effort: Pulmonary effort is normal. No respiratory distress.     Breath sounds: Normal breath sounds.  Abdominal:     General: Bowel sounds are normal.     Palpations: Abdomen is soft.     Tenderness: There is no abdominal tenderness.  Musculoskeletal:        General: No swelling or tenderness.     Cervical back: Neck supple. No tenderness.  Lymphadenopathy:     Cervical: No cervical  adenopathy.  Skin:    Findings: No erythema or rash.  Neurological:     Mental Status: He is alert.  Psychiatric:        Mood and Affect: Mood normal.        Behavior: Behavior normal.    BP 122/78   Pulse 78   Temp (!) 96.2 F (35.7 C)   Ht 6' 0.01" (1.829 m)   Wt 154 lb 12.8 oz (70.2 kg)   SpO2 95%   BMI 20.99 kg/m  Wt Readings from Last 3 Encounters:  09/07/20 154 lb 12.8 oz (70.2 kg)  04/07/20 159 lb (72.1 kg)  01/07/20 160 lb 6.4 oz (72.8 kg)    Outpatient Encounter Medications as of 09/07/2020  Medication Sig   amLODipine (NORVASC) 2.5 MG tablet TAKE 1 TABLET(2.5 MG) BY MOUTH DAILY   aspirin EC 81 MG tablet Take 81 mg by mouth daily.   fluconazole (DIFLUCAN) 200 MG tablet Take 200 mg by mouth once a week.   losartan (COZAAR) 100 MG tablet TAKE 1 TABLET BY MOUTH DAILY   pantoprazole (PROTONIX) 40 MG tablet TAKE 1 TABLET BY MOUTH DAILY   rosuvastatin (CRESTOR) 10 MG tablet TAKE 1 TABLET BY MOUTH DAILY   sertraline (ZOLOFT) 50 MG tablet Take 1/2 tablet x 2 weeks and then one per day.   No facility-administered encounter medications on file as of 09/07/2020.     Lab Results  Component Value Date   WBC 4.3 09/07/2020   HGB 11.9 (L) 09/07/2020   HCT 34.8 (L) 09/07/2020   PLT 244.0 09/07/2020   GLUCOSE 88 09/04/2020   CHOL 145 09/04/2020   TRIG 79.0 09/04/2020   HDL 66.50 09/04/2020   LDLDIRECT 144.2 08/22/2012   LDLCALC 63 09/04/2020   ALT 18 09/04/2020   AST 26 09/04/2020   NA 134 (L) 09/07/2020   K 4.3 09/04/2020   CL 97 09/04/2020   CREATININE 1.01 09/04/2020   BUN 15 09/04/2020   CO2 26 09/04/2020   TSH 1.38 10/24/2019   PSA 1.11 10/24/2019    [place] Extrem Low Right Ltd  Result Date: 06/05/2019 CLINICAL DATA:  Bulbous lesion posterior RIGHT thigh. Status post dog bite 3 weeks ago. EXAM: ULTRASOUND RIGHT LOWER EXTREMITY LIMITED TECHNIQUE: Ultrasound examination of the lower extremity soft tissues was performed in the area of clinical concern. COMPARISON:   None. FINDINGS: Complex collection within the RIGHT posterior-lateral thigh, measuring 5.3 x 2.2 x 4.5 cm, without internal vascularity, most likely hematoma. No surround hypervascularity to suggest abscess. IMPRESSION: Probable hematoma within the  soft tissues of the RIGHT posterior-lateral thigh, measuring 5.3 cm greatest dimension, much less likely to represent abscess. Electronically Signed   By: Bary Richard M.D.   On: 06/05/2019 14:14       Assessment & Plan:   Problem List Items Addressed This Visit     Anemia    hgb slightly decreased on recent lab.  Gave blood just prior to blood being drawn.  No other bleeding.  Recheck cbc to confirm stable.  May need iron studies and B12 checked.  Follow.       Relevant Orders   CBC with Differential/Platelet (Completed)   Essential (primary) hypertension    Continue amlodipine and losartan.  Blood pressure dong well.  Follow pressures.  Follow metabolic panel.       GERD (gastroesophageal reflux disease)    No upper symptoms reported.  On protonix.       Hyponatremia    Low sodium 129 - on recent check.  Had called him and discussed.  Limiting free water intake.  Feels fine.  Recheck sodium today.       Relevant Orders   Sodium (Completed)   Pure hypercholesterolemia    On crestor.  Low cholesterol diet and exercise.  Follow lipid panel and liver function tests.        Stress    Doing well on zoloft.  Retired.  Follow.         Dale [place], MD

## 2020-09-08 LAB — CBC WITH DIFFERENTIAL/PLATELET
Basophils Absolute: 0 10*3/uL (ref 0.0–0.1)
Basophils Relative: 0.8 % (ref 0.0–3.0)
Eosinophils Absolute: 0 10*3/uL (ref 0.0–0.7)
Eosinophils Relative: 0.9 % (ref 0.0–5.0)
HCT: 34.8 % — ABNORMAL LOW (ref 39.0–52.0)
Hemoglobin: 11.9 g/dL — ABNORMAL LOW (ref 13.0–17.0)
Lymphocytes Relative: 36.1 % (ref 12.0–46.0)
Lymphs Abs: 1.6 10*3/uL (ref 0.7–4.0)
MCHC: 34.1 g/dL (ref 30.0–36.0)
MCV: 86.7 fl (ref 78.0–100.0)
Monocytes Absolute: 0.4 10*3/uL (ref 0.1–1.0)
Monocytes Relative: 9.7 % (ref 3.0–12.0)
Neutro Abs: 2.3 10*3/uL (ref 1.4–7.7)
Neutrophils Relative %: 52.5 % (ref 43.0–77.0)
Platelets: 244 10*3/uL (ref 150.0–400.0)
RBC: 4.01 Mil/uL — ABNORMAL LOW (ref 4.22–5.81)
RDW: 13.7 % (ref 11.5–15.5)
WBC: 4.3 10*3/uL (ref 4.0–10.5)

## 2020-09-08 LAB — SODIUM: Sodium: 134 mEq/L — ABNORMAL LOW (ref 135–145)

## 2020-09-09 ENCOUNTER — Other Ambulatory Visit: Payer: Self-pay | Admitting: Internal Medicine

## 2020-09-09 DIAGNOSIS — D649 Anemia, unspecified: Secondary | ICD-10-CM

## 2020-09-09 NOTE — Progress Notes (Signed)
Orders placed for add on labs.  

## 2020-09-10 ENCOUNTER — Other Ambulatory Visit: Payer: Self-pay | Admitting: Internal Medicine

## 2020-09-10 DIAGNOSIS — D649 Anemia, unspecified: Secondary | ICD-10-CM

## 2020-09-14 ENCOUNTER — Encounter: Payer: Self-pay | Admitting: Internal Medicine

## 2020-09-14 IMAGING — US US EXTREM LOW*R* LIMITED
1 series · 12 of 12 positions shown · non-contrast
Comparison: None.

CLINICAL DATA: Bulbous lesion posterior RIGHT thigh. Status post
dog bite 3 weeks ago.

EXAM:
ULTRASOUND RIGHT LOWER EXTREMITY LIMITED
TECHNIQUE: Ultrasound examination of the lower extremity soft tissues was
performed in the area of clinical concern.

[Series 1: us extrem low*right* limited · 0.07mm/px · 12 acquisitions, 12 frames shown]
[im 1/12]
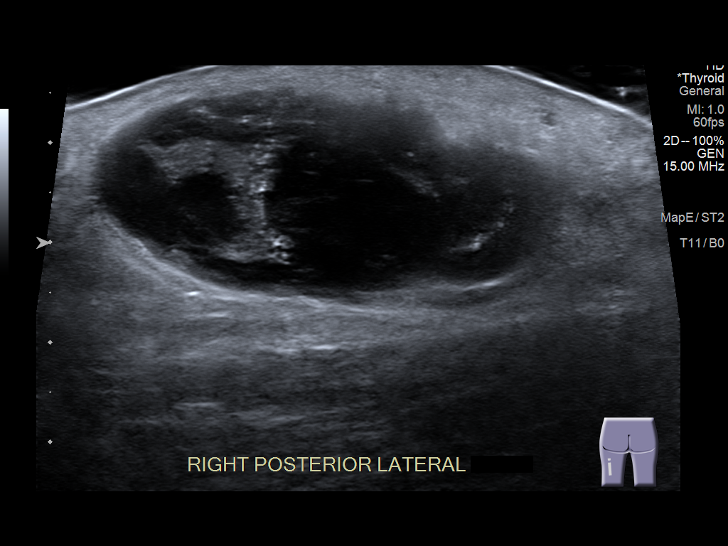
[im 2/12]
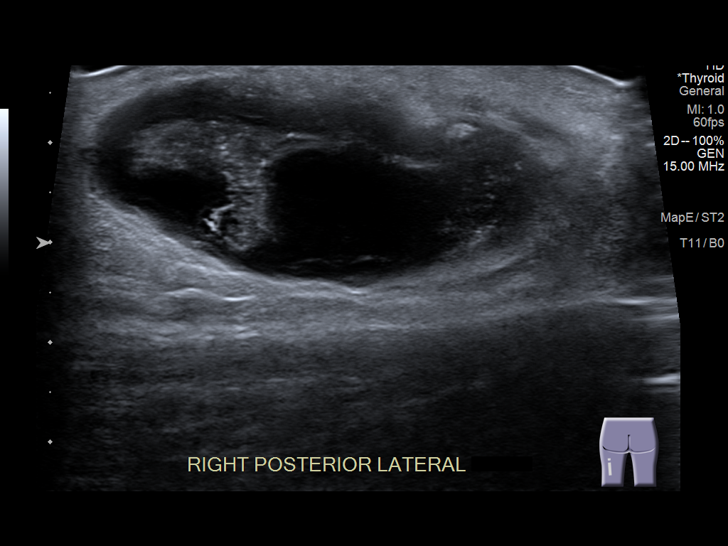
[im 3/12]
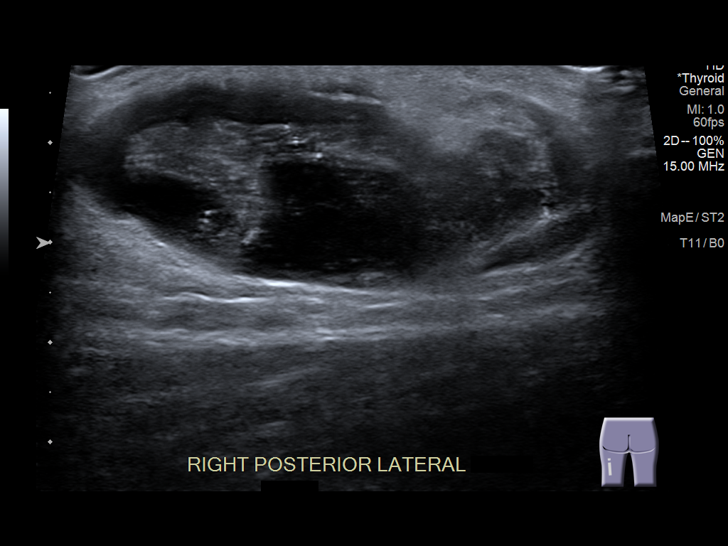
[im 4/12]
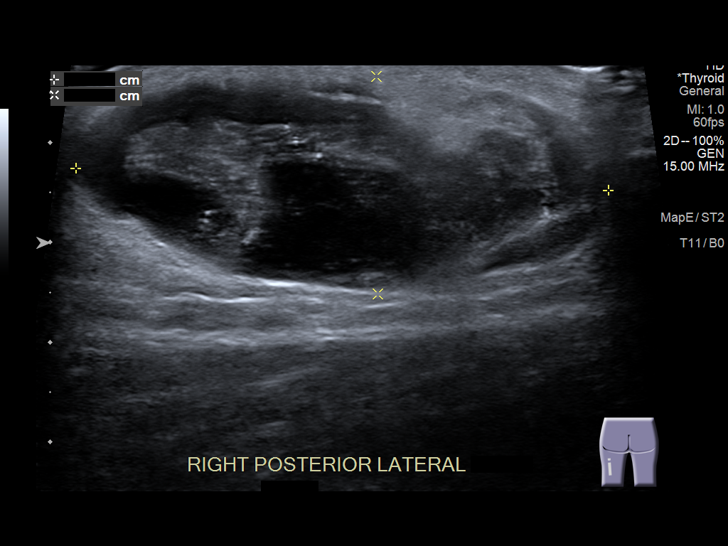
[im 5/12]
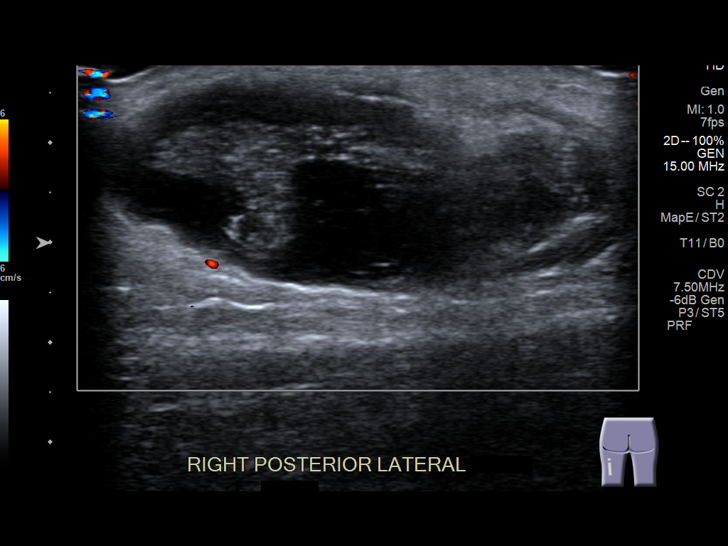
[im 6/12]
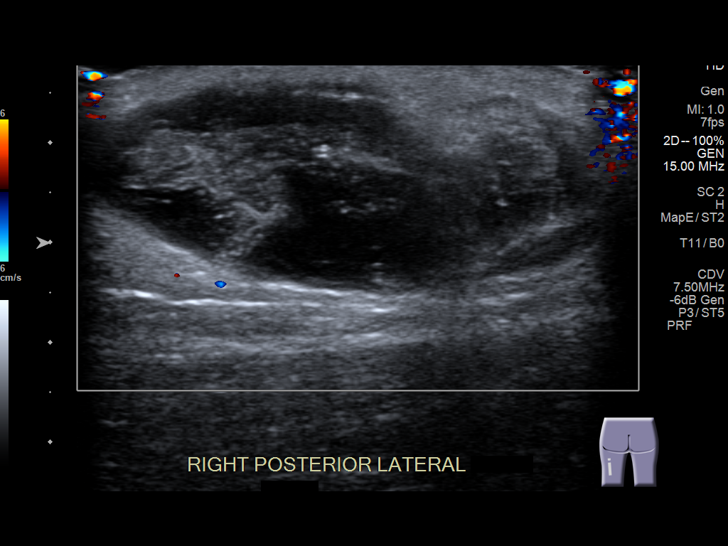
[im 7/12]
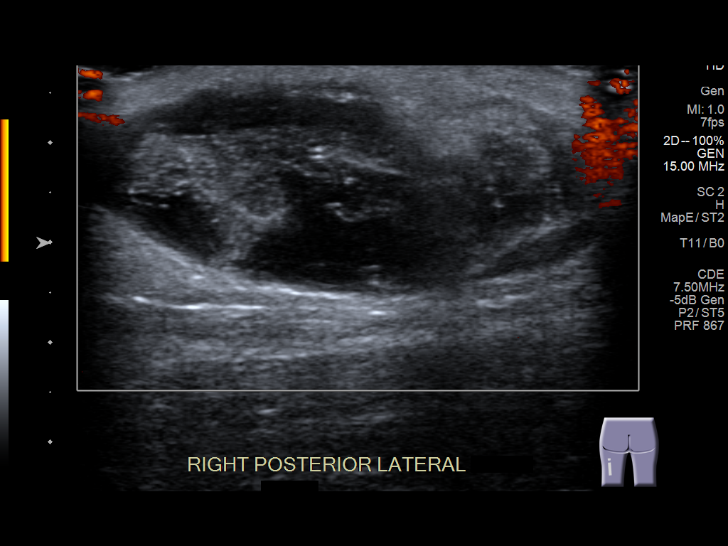
[im 8/12]
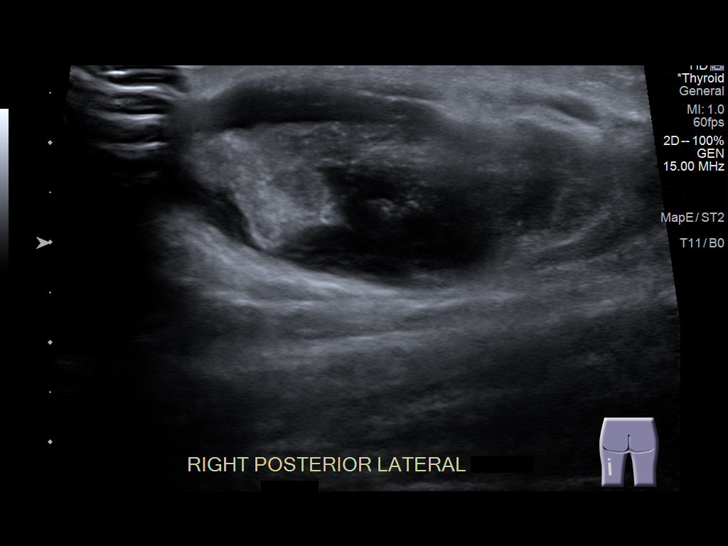
[im 9/12]
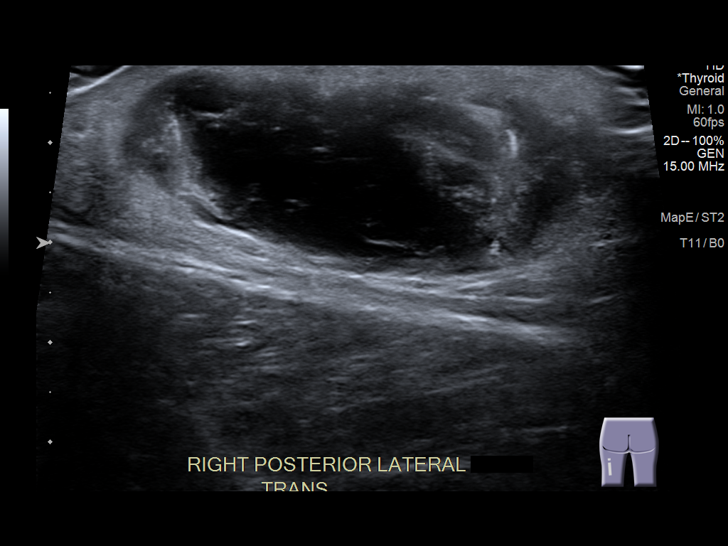
[im 10/12]
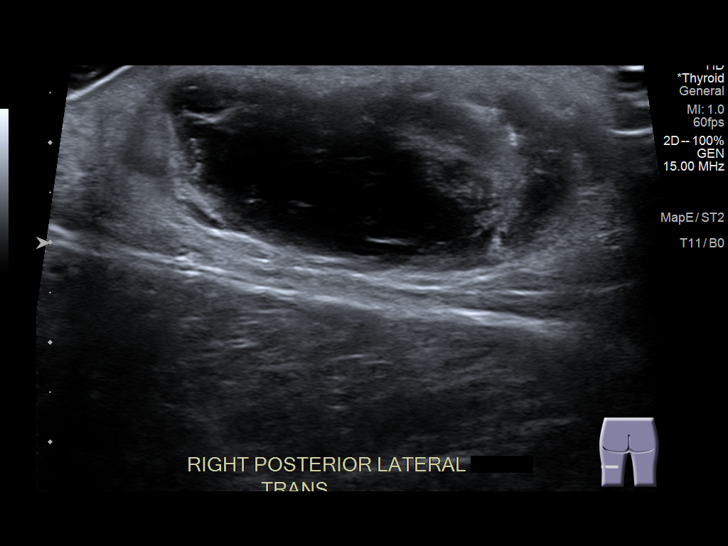
[im 11/12]
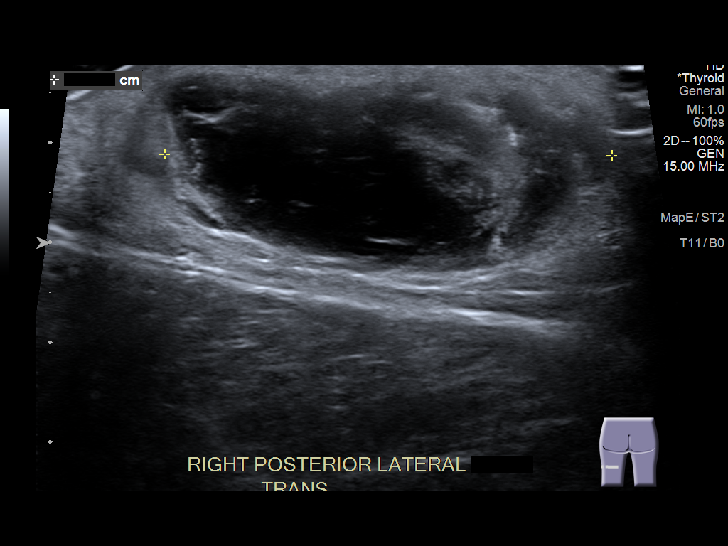
[im 12/12]
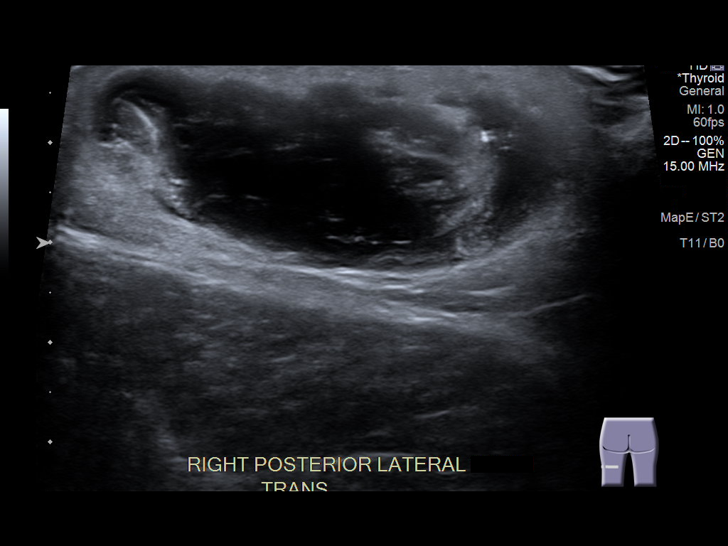

[12 of 12 positions shown; findings below may reference images not displayed]

FINDINGS: Complex collection within the RIGHT posterior-lateral thigh,
measuring 5.3 x 2.2 x 4.5 cm, without internal vascularity, most
likely hematoma. No surround hypervascularity to suggest abscess.
IMPRESSION: Probable hematoma within the soft tissues of the RIGHT
posterior-lateral thigh, measuring 5.3 cm greatest dimension, much
less likely to represent abscess.

## 2020-09-14 NOTE — Assessment & Plan Note (Signed)
No upper symptoms reported.  On protonix.   

## 2020-09-14 NOTE — Assessment & Plan Note (Signed)
hgb slightly decreased on recent lab.  Gave blood just prior to blood being drawn.  No other bleeding.  Recheck cbc to confirm stable.  May need iron studies and B12 checked.  Follow.

## 2020-09-14 NOTE — Assessment & Plan Note (Signed)
On crestor.  Low cholesterol diet and exercise.  Follow lipid panel and liver function tests.   

## 2020-09-14 NOTE — Assessment & Plan Note (Signed)
Low sodium 129 - on recent check.  Had called him and discussed.  Limiting free water intake.  Feels fine.  Recheck sodium today.

## 2020-09-14 NOTE — Assessment & Plan Note (Signed)
Doing well on zoloft.  Retired.  Follow.  

## 2020-09-14 NOTE — Assessment & Plan Note (Signed)
Continue amlodipine and losartan.  Blood pressure dong well.  Follow pressures.  Follow metabolic panel.  

## 2020-10-12 ENCOUNTER — Other Ambulatory Visit: Payer: Medicare PPO

## 2020-10-19 ENCOUNTER — Other Ambulatory Visit: Payer: Self-pay

## 2020-10-19 ENCOUNTER — Other Ambulatory Visit (INDEPENDENT_AMBULATORY_CARE_PROVIDER_SITE_OTHER): Payer: Medicare PPO

## 2020-10-19 DIAGNOSIS — D649 Anemia, unspecified: Secondary | ICD-10-CM | POA: Diagnosis not present

## 2020-10-19 LAB — CBC WITH DIFFERENTIAL/PLATELET
Basophils Absolute: 0 10*3/uL (ref 0.0–0.1)
Basophils Relative: 0.4 % (ref 0.0–3.0)
Eosinophils Absolute: 0 10*3/uL (ref 0.0–0.7)
Eosinophils Relative: 0.6 % (ref 0.0–5.0)
HCT: 35.9 % — ABNORMAL LOW (ref 39.0–52.0)
Hemoglobin: 12.2 g/dL — ABNORMAL LOW (ref 13.0–17.0)
Lymphocytes Relative: 37.1 % (ref 12.0–46.0)
Lymphs Abs: 1.7 10*3/uL (ref 0.7–4.0)
MCHC: 34 g/dL (ref 30.0–36.0)
MCV: 85.8 fl (ref 78.0–100.0)
Monocytes Absolute: 0.3 10*3/uL (ref 0.1–1.0)
Monocytes Relative: 7.1 % (ref 3.0–12.0)
Neutro Abs: 2.6 10*3/uL (ref 1.4–7.7)
Neutrophils Relative %: 54.8 % (ref 43.0–77.0)
Platelets: 241 10*3/uL (ref 150.0–400.0)
RBC: 4.19 Mil/uL — ABNORMAL LOW (ref 4.22–5.81)
RDW: 12.8 % (ref 11.5–15.5)
WBC: 4.7 10*3/uL (ref 4.0–10.5)

## 2020-10-19 LAB — IBC + FERRITIN
Ferritin: 30.2 ng/mL (ref 22.0–322.0)
Iron: 74 ug/dL (ref 42–165)
Saturation Ratios: 16.6 % — ABNORMAL LOW (ref 20.0–50.0)
TIBC: 446.6 ug/dL (ref 250.0–450.0)
Transferrin: 319 mg/dL (ref 212.0–360.0)

## 2020-10-19 LAB — VITAMIN B12: Vitamin B-12: 870 pg/mL (ref 211–911)

## 2020-10-22 ENCOUNTER — Other Ambulatory Visit: Payer: Self-pay | Admitting: Internal Medicine

## 2020-10-22 ENCOUNTER — Telehealth: Payer: Self-pay | Admitting: *Deleted

## 2020-10-22 DIAGNOSIS — E559 Vitamin D deficiency, unspecified: Secondary | ICD-10-CM

## 2020-10-22 DIAGNOSIS — D649 Anemia, unspecified: Secondary | ICD-10-CM

## 2020-10-22 DIAGNOSIS — I10 Essential (primary) hypertension: Secondary | ICD-10-CM

## 2020-10-22 DIAGNOSIS — E7800 Pure hypercholesterolemia, unspecified: Secondary | ICD-10-CM

## 2020-10-22 DIAGNOSIS — Z125 Encounter for screening for malignant neoplasm of prostate: Secondary | ICD-10-CM

## 2020-10-22 NOTE — Telephone Encounter (Signed)
LMTCB

## 2020-10-22 NOTE — Telephone Encounter (Signed)
-----   Message from Dale [place], MD sent at 10/22/2020  3:41 AM EDT ----- Notify Scott Cohen that his hgb has slightly improved.  B12 level wnl.  He gave blood prior to previous check.  Notify him to hold on giving blood.  We will follow cbc.  Schedule fasting labs on week before next appt (scheduled for 01/08/21).

## 2020-10-22 NOTE — Progress Notes (Signed)
Orders placed for f/u labs.  

## 2020-11-10 ENCOUNTER — Other Ambulatory Visit: Payer: Self-pay | Admitting: Internal Medicine

## 2020-11-30 ENCOUNTER — Other Ambulatory Visit: Payer: Self-pay

## 2020-11-30 ENCOUNTER — Encounter: Payer: Self-pay | Admitting: Internal Medicine

## 2020-11-30 MED ORDER — AMLODIPINE BESYLATE 2.5 MG PO TABS: ORAL_TABLET | ORAL | 1 refills | Status: DC

## 2020-11-30 MED ORDER — PANTOPRAZOLE SODIUM 40 MG PO TBEC: DELAYED_RELEASE_TABLET | ORAL | 1 refills | Status: DC

## 2020-12-11 DIAGNOSIS — B351 Tinea unguium: Secondary | ICD-10-CM | POA: Diagnosis not present

## 2020-12-11 DIAGNOSIS — D225 Melanocytic nevi of trunk: Secondary | ICD-10-CM | POA: Diagnosis not present

## 2020-12-11 DIAGNOSIS — D2272 Melanocytic nevi of left lower limb, including hip: Secondary | ICD-10-CM | POA: Diagnosis not present

## 2020-12-11 DIAGNOSIS — D2271 Melanocytic nevi of right lower limb, including hip: Secondary | ICD-10-CM | POA: Diagnosis not present

## 2020-12-11 DIAGNOSIS — L821 Other seborrheic keratosis: Secondary | ICD-10-CM | POA: Diagnosis not present

## 2020-12-11 DIAGNOSIS — D2261 Melanocytic nevi of right upper limb, including shoulder: Secondary | ICD-10-CM | POA: Diagnosis not present

## 2020-12-11 DIAGNOSIS — D2262 Melanocytic nevi of left upper limb, including shoulder: Secondary | ICD-10-CM | POA: Diagnosis not present

## 2020-12-11 DIAGNOSIS — D171 Benign lipomatous neoplasm of skin and subcutaneous tissue of trunk: Secondary | ICD-10-CM | POA: Diagnosis not present

## 2021-01-07 ENCOUNTER — Other Ambulatory Visit (INDEPENDENT_AMBULATORY_CARE_PROVIDER_SITE_OTHER): Payer: Medicare PPO

## 2021-01-07 ENCOUNTER — Other Ambulatory Visit: Payer: Self-pay

## 2021-01-07 DIAGNOSIS — E559 Vitamin D deficiency, unspecified: Secondary | ICD-10-CM | POA: Diagnosis not present

## 2021-01-07 DIAGNOSIS — E7800 Pure hypercholesterolemia, unspecified: Secondary | ICD-10-CM | POA: Diagnosis not present

## 2021-01-07 DIAGNOSIS — I10 Essential (primary) hypertension: Secondary | ICD-10-CM

## 2021-01-07 DIAGNOSIS — Z125 Encounter for screening for malignant neoplasm of prostate: Secondary | ICD-10-CM | POA: Diagnosis not present

## 2021-01-07 DIAGNOSIS — D649 Anemia, unspecified: Secondary | ICD-10-CM | POA: Diagnosis not present

## 2021-01-07 LAB — LIPID PANEL
Cholesterol: 168 mg/dL (ref 0–200)
HDL: 81.5 mg/dL (ref >39.00)
LDL Cholesterol: 72 mg/dL (ref 0–99)
NonHDL: 86.72
Total CHOL/HDL Ratio: 2
Triglycerides: 74 mg/dL (ref 0.0–149.0)
VLDL: 14.8 mg/dL (ref 0.0–40.0)

## 2021-01-07 LAB — HEPATIC FUNCTION PANEL
ALT: 23 U/L (ref 0–53)
AST: 24 U/L (ref 0–37)
Albumin: 4.4 g/dL (ref 3.5–5.2)
Alkaline Phosphatase: 72 U/L (ref 39–117)
Bilirubin, Direct: 0.1 mg/dL (ref 0.0–0.3)
Total Bilirubin: 0.4 mg/dL (ref 0.2–1.2)
Total Protein: 6.8 g/dL (ref 6.0–8.3)

## 2021-01-07 LAB — BASIC METABOLIC PANEL
BUN: 10 mg/dL (ref 6–23)
CO2: 28 mEq/L (ref 19–32)
Calcium: 9.3 mg/dL (ref 8.4–10.5)
Chloride: 97 mEq/L (ref 96–112)
Creatinine, Ser: 0.86 mg/dL (ref 0.40–1.50)
GFR: 87.84 mL/min (ref >60.00)
Glucose, Bld: 84 mg/dL (ref 70–99)
Potassium: 4.7 mEq/L (ref 3.5–5.1)
Sodium: 131 mEq/L — ABNORMAL LOW (ref 135–145)

## 2021-01-07 LAB — FERRITIN: Ferritin: 20 ng/mL — ABNORMAL LOW (ref 22.0–322.0)

## 2021-01-07 LAB — CBC WITH DIFFERENTIAL/PLATELET
Basophils Absolute: 0 10*3/uL (ref 0.0–0.1)
Basophils Relative: 1.1 % (ref 0.0–3.0)
Eosinophils Absolute: 0 10*3/uL (ref 0.0–0.7)
Eosinophils Relative: 1 % (ref 0.0–5.0)
HCT: 41.2 % (ref 39.0–52.0)
Hemoglobin: 13.7 g/dL (ref 13.0–17.0)
Lymphocytes Relative: 38.4 % (ref 12.0–46.0)
Lymphs Abs: 1.6 10*3/uL (ref 0.7–4.0)
MCHC: 33.4 g/dL (ref 30.0–36.0)
MCV: 87 fl (ref 78.0–100.0)
Monocytes Absolute: 0.4 10*3/uL (ref 0.1–1.0)
Monocytes Relative: 10.1 % (ref 3.0–12.0)
Neutro Abs: 2 10*3/uL (ref 1.4–7.7)
Neutrophils Relative %: 49.4 % (ref 43.0–77.0)
Platelets: 276 10*3/uL (ref 150.0–400.0)
RBC: 4.73 Mil/uL (ref 4.22–5.81)
RDW: 13.8 % (ref 11.5–15.5)
WBC: 4.1 10*3/uL (ref 4.0–10.5)

## 2021-01-07 LAB — VITAMIN D 25 HYDROXY (VIT D DEFICIENCY, FRACTURES): VITD: 32.55 ng/mL (ref 30.00–100.00)

## 2021-01-07 LAB — PSA, MEDICARE: PSA: 2.09 ng/ml (ref 0.10–4.00)

## 2021-01-07 LAB — TSH: TSH: 1.38 u[IU]/mL (ref 0.35–5.50)

## 2021-01-08 ENCOUNTER — Ambulatory Visit (INDEPENDENT_AMBULATORY_CARE_PROVIDER_SITE_OTHER): Payer: Medicare PPO | Admitting: Internal Medicine

## 2021-01-08 VITALS — BP 110/72 | HR 79 | Temp 98.0°F | Resp 16 | Ht 72.0 in | Wt 153.2 lb

## 2021-01-08 DIAGNOSIS — I10 Essential (primary) hypertension: Secondary | ICD-10-CM | POA: Diagnosis not present

## 2021-01-08 DIAGNOSIS — K219 Gastro-esophageal reflux disease without esophagitis: Secondary | ICD-10-CM | POA: Diagnosis not present

## 2021-01-08 DIAGNOSIS — Z0000 Encounter for general adult medical examination without abnormal findings: Secondary | ICD-10-CM | POA: Diagnosis not present

## 2021-01-08 DIAGNOSIS — F439 Reaction to severe stress, unspecified: Secondary | ICD-10-CM | POA: Diagnosis not present

## 2021-01-08 DIAGNOSIS — E871 Hypo-osmolality and hyponatremia: Secondary | ICD-10-CM

## 2021-01-08 DIAGNOSIS — E611 Iron deficiency: Secondary | ICD-10-CM | POA: Diagnosis not present

## 2021-01-08 DIAGNOSIS — E7800 Pure hypercholesterolemia, unspecified: Secondary | ICD-10-CM

## 2021-01-08 NOTE — Progress Notes (Signed)
Patient ID: Scott Cohen, male   DOB: 1950/09/28, 70 y.o.   MRN: [id number]   Subjective:    Patient ID: Scott Cohen, male    DOB: April 17, 1950, 70 y.o.   MRN: [id number]  This visit occurred during the SARS-CoV-2 public health emergency.  Safety protocols were in place, including screening questions prior to the visit, additional usage of staff PPE, and extensive cleaning of exam room while observing appropriate contact time as indicated for disinfecting solutions.   Patient here for his physical  .   HPI Still with increased stress.  Stress with work.  Discussed.  Hopefully will be able to cut back work hours soon.  Tries to stay active.  No chest pain or sob reported.  One "spell" since his last visit.  No syncope.  No near syncope.  Desires no further w/up or evaluation.  Eating.  Bowels are moving.  Discussed labs.  Discussed low iron. Hgb is now normal.  Has not given blood recently.       Past Medical History:  Diagnosis Date   Hypercholesterolemia    Hypertension    Leukopenia    benign cyclical   Past Surgical History:  Procedure Laterality Date   WISDOM TOOTH EXTRACTION     Family History  Problem Relation Age of Onset   Heart disease Mother        pacemaker   Diabetes Mother    Heart disease Father        myocardial infarction   Hypertension Father    Diabetes Father    CVA Father    Colon cancer Neg Hx    Prostate cancer Neg Hx    Social History   Socioeconomic History   Marital status: Married    Spouse name: Not on file   Number of children: Not on file   Years of education: Not on file   Highest education level: Not on file  Occupational History    Employer: [employer]  Tobacco Use   Smoking status: Never   Smokeless tobacco: Never  Vaping Use   Vaping Use: Never used  Substance and Sexual Activity   Alcohol use: Yes    Alcohol/week: 0.0 standard drinks    Comment: 2-3 beers daily   Drug use: No   Sexual activity: Not on file  Other Topics  Concern   Not on file  Social History Narrative   Not on file   Social Determinants of Health   Financial Resource Strain: Not on file  Food Insecurity: Not on file  Transportation Needs: Not on file  Physical Activity: Not on file  Stress: Not on file  Social Connections: Not on file     Review of Systems  Constitutional:  Negative for appetite change and unexpected weight change.  HENT:  Negative for congestion, sinus pressure and sore throat.   Eyes:  Negative for pain and visual disturbance.  Respiratory:  Negative for cough, chest tightness and shortness of breath.   Cardiovascular:  Negative for chest pain, palpitations and leg swelling.  Gastrointestinal:  Negative for abdominal pain, diarrhea, nausea and vomiting.  Genitourinary:  Negative for difficulty urinating and dysuria.  Musculoskeletal:  Negative for joint swelling and myalgias.  Skin:  Negative for color change and rash.  Neurological:  Negative for dizziness, light-headedness and headaches.  Hematological:  Negative for adenopathy. Does not bruise/bleed easily.  Psychiatric/Behavioral:  Negative for agitation and dysphoric mood.       Objective:  BP 110/72   Pulse 79   Temp 98 F (36.7 C) (Oral)   Resp 16   Ht 6' (1.829 m)   Wt 153 lb 4 oz (69.5 kg)   SpO2 95%   BMI 20.78 kg/m  Wt Readings from Last 3 Encounters:  01/08/21 153 lb 4 oz (69.5 kg)  09/07/20 154 lb 12.8 oz (70.2 kg)  04/07/20 159 lb (72.1 kg)    Physical Exam Constitutional:      General: He is not in acute distress.    Appearance: Normal appearance. He is well-developed.  HENT:     Head: Normocephalic and atraumatic.     Right Ear: External ear normal.     Left Ear: External ear normal.  Eyes:     General: No scleral icterus.       Right eye: No discharge.        Left eye: No discharge.     Conjunctiva/sclera: Conjunctivae normal.  Neck:     Thyroid: No thyromegaly.  Cardiovascular:     Rate and Rhythm: Normal rate  and regular rhythm.  Pulmonary:     Effort: No respiratory distress.     Breath sounds: Normal breath sounds. No wheezing.  Abdominal:     General: Bowel sounds are normal.     Palpations: Abdomen is soft.     Tenderness: There is no abdominal tenderness.  Musculoskeletal:        General: No swelling or tenderness.     Cervical back: Neck supple. No tenderness.  Lymphadenopathy:     Cervical: No cervical adenopathy.  Skin:    Findings: No erythema or rash.  Neurological:     Mental Status: He is alert and oriented to person, place, and time.  Psychiatric:        Mood and Affect: Mood normal.        Behavior: Behavior normal.     Outpatient Encounter Medications as of 01/08/2021  Medication Sig   amLODipine (NORVASC) 2.5 MG tablet TAKE 1 TABLET(2.5 MG) BY MOUTH DAILY   aspirin EC 81 MG tablet Take 81 mg by mouth daily.   losartan (COZAAR) 100 MG tablet TAKE 1 TABLET BY MOUTH DAILY   pantoprazole (PROTONIX) 40 MG tablet TAKE 1 TABLET BY MOUTH DAILY   rosuvastatin (CRESTOR) 10 MG tablet TAKE 1 TABLET BY MOUTH DAILY   [DISCONTINUED] fluconazole (DIFLUCAN) 200 MG tablet Take 200 mg by mouth once a week. (Patient not taking: Reported on 01/08/2021)   [DISCONTINUED] sertraline (ZOLOFT) 50 MG tablet Take 1/2 tablet x 2 weeks and then one per day. (Patient not taking: Reported on 01/08/2021)   No facility-administered encounter medications on file as of 01/08/2021.     Lab Results  Component Value Date   WBC 4.1 01/07/2021   HGB 13.7 01/07/2021   HCT 41.2 01/07/2021   PLT 276.0 01/07/2021   GLUCOSE 84 01/07/2021   CHOL 168 01/07/2021   TRIG 74.0 01/07/2021   HDL 81.50 01/07/2021   LDLDIRECT 144.2 08/22/2012   LDLCALC 72 01/07/2021   ALT 23 01/07/2021   AST 24 01/07/2021   NA 131 (L) 01/07/2021   K 4.7 01/07/2021   CL 97 01/07/2021   CREATININE 0.86 01/07/2021   BUN 10 01/07/2021   CO2 28 01/07/2021   TSH 1.38 01/07/2021   PSA 2.09 01/07/2021    [place] Extrem Low Right  Ltd  Result Date: 06/05/2019 CLINICAL DATA:  Bulbous lesion posterior RIGHT thigh. Status post dog bite 3 weeks ago.  EXAM: ULTRASOUND RIGHT LOWER EXTREMITY LIMITED TECHNIQUE: Ultrasound examination of the lower extremity soft tissues was performed in the area of clinical concern. COMPARISON:  None. FINDINGS: Complex collection within the RIGHT posterior-lateral thigh, measuring 5.3 x 2.2 x 4.5 cm, without internal vascularity, most likely hematoma. No surround hypervascularity to suggest abscess. IMPRESSION: Probable hematoma within the soft tissues of the RIGHT posterior-lateral thigh, measuring 5.3 cm greatest dimension, much less likely to represent abscess. Electronically Signed   By: Franki Cabot M.D.   On: 06/05/2019 14:14       Assessment & Plan:   Problem List Items Addressed This Visit     Essential (primary) hypertension    Continue amlodipine and losartan.  Blood pressure dong well.  Follow pressures.  Follow metabolic panel.       GERD (gastroesophageal reflux disease)    No upper symptoms reported.  On protonix.       Healthcare maintenance    Physical today 01/08/21.  Colonoscopy 12/2017.  PSA 12/2020 - 2/09.        Hyponatremia    Sodium 131 on recent check.  Follow.  Recheck sodium within the next couple of weeks.        Relevant Orders   Sodium   Iron deficiency    hgb wnl on recent check.  Iron stores low.  Has not given blood recently.  Colonoscopy 2019.  No increased acid reflux.  Occasional - "irritation" of stomach.  Nothing persistent.  Start ferrous sulfate 325mg  q day.  Follow cbc and iron studies.  Contact GI - to determine if further GI w/up regarding iron deficiency is warranted.       Pure hypercholesterolemia    On crestor.  Low cholesterol diet and exercise.  Follow lipid panel and liver function tests.        Stress    Off zoloft.  Appears to be doing well off.  Increased stress.  Appears to be handling.  Follow.       Other Visit Diagnoses      Routine general medical examination at a health care facility    -  Primary        Einar [place], MD

## 2021-01-08 NOTE — Assessment & Plan Note (Addendum)
Physical today 01/08/21.  Colonoscopy 12/2017.  PSA 12/2020 - 2/09.

## 2021-01-09 ENCOUNTER — Encounter: Payer: Self-pay | Admitting: Internal Medicine

## 2021-01-09 ENCOUNTER — Telehealth: Payer: Self-pay | Admitting: Internal Medicine

## 2021-01-09 NOTE — Assessment & Plan Note (Signed)
Off zoloft.  Appears to be doing well off.  Increased stress.  Appears to be handling.  Follow.

## 2021-01-09 NOTE — Assessment & Plan Note (Signed)
Sodium 131 on recent check.  Follow.  Recheck sodium within the next couple of weeks.

## 2021-01-09 NOTE — Assessment & Plan Note (Signed)
Continue amlodipine and losartan.  Blood pressure dong well.  Follow pressures.  Follow metabolic panel.  

## 2021-01-09 NOTE — Assessment & Plan Note (Signed)
On crestor.  Low cholesterol diet and exercise.  Follow lipid panel and liver function tests.   

## 2021-01-09 NOTE — Assessment & Plan Note (Addendum)
hgb wnl on recent check.  Iron stores low.  Has not given blood recently.  Colonoscopy 2019.  No increased acid reflux.  Occasional - "irritation" of stomach.  Nothing persistent.  Start ferrous sulfate 325mg  q day.  Follow cbc and iron studies.  Contact GI - to determine if further GI w/up regarding iron deficiency is warranted.

## 2021-01-09 NOTE — Assessment & Plan Note (Signed)
No upper symptoms reported.  On protonix.   

## 2021-01-09 NOTE — Telephone Encounter (Signed)
My chart message sent to inform pt of iron supplement - to take.

## 2021-01-19 ENCOUNTER — Other Ambulatory Visit: Payer: Self-pay

## 2021-01-19 ENCOUNTER — Other Ambulatory Visit (INDEPENDENT_AMBULATORY_CARE_PROVIDER_SITE_OTHER): Payer: Medicare PPO

## 2021-01-19 DIAGNOSIS — E871 Hypo-osmolality and hyponatremia: Secondary | ICD-10-CM

## 2021-01-19 LAB — SODIUM: Sodium: 135 mEq/L (ref 135–145)

## 2021-05-03 DIAGNOSIS — H2513 Age-related nuclear cataract, bilateral: Secondary | ICD-10-CM | POA: Diagnosis not present

## 2021-05-12 ENCOUNTER — Ambulatory Visit: Payer: Medicare PPO | Admitting: Internal Medicine

## 2021-05-12 ENCOUNTER — Encounter: Payer: Self-pay | Admitting: Internal Medicine

## 2021-05-12 VITALS — BP 128/80 | HR 84 | Temp 98.1°F | Ht 70.0 in | Wt 155.0 lb

## 2021-05-12 DIAGNOSIS — Z87898 Personal history of other specified conditions: Secondary | ICD-10-CM | POA: Diagnosis not present

## 2021-05-12 DIAGNOSIS — K219 Gastro-esophageal reflux disease without esophagitis: Secondary | ICD-10-CM

## 2021-05-12 DIAGNOSIS — I10 Essential (primary) hypertension: Secondary | ICD-10-CM

## 2021-05-12 DIAGNOSIS — E611 Iron deficiency: Secondary | ICD-10-CM | POA: Diagnosis not present

## 2021-05-12 DIAGNOSIS — F439 Reaction to severe stress, unspecified: Secondary | ICD-10-CM | POA: Diagnosis not present

## 2021-05-12 DIAGNOSIS — E7800 Pure hypercholesterolemia, unspecified: Secondary | ICD-10-CM

## 2021-05-12 DIAGNOSIS — D72819 Decreased white blood cell count, unspecified: Secondary | ICD-10-CM | POA: Diagnosis not present

## 2021-05-12 DIAGNOSIS — Z1159 Encounter for screening for other viral diseases: Secondary | ICD-10-CM | POA: Diagnosis not present

## 2021-05-12 LAB — CBC WITH DIFFERENTIAL/PLATELET
Basophils Absolute: 0 10*3/uL (ref 0.0–0.1)
Basophils Relative: 0.8 % (ref 0.0–3.0)
Eosinophils Absolute: 0 10*3/uL (ref 0.0–0.7)
Eosinophils Relative: 0.8 % (ref 0.0–5.0)
HCT: 40 % (ref 39.0–52.0)
Hemoglobin: 13.7 g/dL (ref 13.0–17.0)
Lymphocytes Relative: 24.6 % (ref 12.0–46.0)
Lymphs Abs: 1.2 10*3/uL (ref 0.7–4.0)
MCHC: 34.3 g/dL (ref 30.0–36.0)
MCV: 91.1 fl (ref 78.0–100.0)
Monocytes Absolute: 0.4 10*3/uL (ref 0.1–1.0)
Monocytes Relative: 8.7 % (ref 3.0–12.0)
Neutro Abs: 3.2 10*3/uL (ref 1.4–7.7)
Neutrophils Relative %: 65.1 % (ref 43.0–77.0)
Platelets: 246 10*3/uL (ref 150.0–400.0)
RBC: 4.39 Mil/uL (ref 4.22–5.81)
RDW: 13.1 % (ref 11.5–15.5)
WBC: 4.9 10*3/uL (ref 4.0–10.5)

## 2021-05-12 LAB — HEPATIC FUNCTION PANEL
ALT: 37 U/L (ref 0–53)
AST: 47 U/L — ABNORMAL HIGH (ref 0–37)
Albumin: 4.7 g/dL (ref 3.5–5.2)
Alkaline Phosphatase: 88 U/L (ref 39–117)
Bilirubin, Direct: 0.1 mg/dL (ref 0.0–0.3)
Total Bilirubin: 0.5 mg/dL (ref 0.2–1.2)
Total Protein: 6.9 g/dL (ref 6.0–8.3)

## 2021-05-12 LAB — IBC + FERRITIN
Ferritin: 91 ng/mL (ref 22.0–322.0)
Iron: 111 ug/dL (ref 42–165)
Saturation Ratios: 24.5 % (ref 20.0–50.0)
TIBC: 453.6 ug/dL — ABNORMAL HIGH (ref 250.0–450.0)
Transferrin: 324 mg/dL (ref 212.0–360.0)

## 2021-05-12 LAB — BASIC METABOLIC PANEL
BUN: 13 mg/dL (ref 6–23)
CO2: 27 mEq/L (ref 19–32)
Calcium: 9.2 mg/dL (ref 8.4–10.5)
Chloride: 98 mEq/L (ref 96–112)
Creatinine, Ser: 0.91 mg/dL (ref 0.40–1.50)
GFR: 85.3 mL/min (ref >60.00)
Glucose, Bld: 93 mg/dL (ref 70–99)
Potassium: 4.8 mEq/L (ref 3.5–5.1)
Sodium: 135 mEq/L (ref 135–145)

## 2021-05-12 LAB — LIPID PANEL
Cholesterol: 176 mg/dL (ref 0–200)
HDL: 86.4 mg/dL (ref >39.00)
LDL Cholesterol: 69 mg/dL (ref 0–99)
NonHDL: 90.04
Total CHOL/HDL Ratio: 2
Triglycerides: 105 mg/dL (ref 0.0–149.0)
VLDL: 21 mg/dL (ref 0.0–40.0)

## 2021-05-12 NOTE — Progress Notes (Signed)
Patient ID: Scott Cohen, male   DOB: 1950/04/23, 71 y.o.   MRN: [id number] ? ? ?Subjective:  ? ? Patient ID: Scott Cohen, male    DOB: [birth year], 71 y.o.   MRN: [id number] ? ?This visit occurred during the SARS-CoV-2 public health emergency.  Safety protocols were in place, including screening questions prior to the visit, additional usage of staff PPE, and extensive cleaning of exam room while observing appropriate contact time as indicated for disinfecting solutions.  ? ?Patient here for a scheduled follow up.  ? ?Chief Complaint  ?Patient presents with  ? Follow-up  ?  4 mo f/u - pt wishes to discuss syncope and GERD concerns.  ? .  ? ?HPI ?Since his last visit, had a couple of "spells".  First episode (last week of December) - has been up and busy.  Moving around house. Felt like he was going to pass out.  After lying down, symptoms leveled out.  Felt thinking was a little cloudy - when occurred.  No focal neuro deficits.  No seizure activity.  Second episode - had not been active.  Felt similar feelings.  After lying down - resolved.  Occurred about six weeks ago.  No known triggers.  When active, denies chest pain.  No sob.  No cough or congestion.  Does report increased acid reflux.  No abdominal pain or bowel change reported.  Decreased stress.  Has retired.  ? ? ?Past Medical History:  ?Diagnosis Date  ? Hypercholesterolemia   ? Hypertension   ? Leukopenia   ? benign cyclical  ? ?Past Surgical History:  ?Procedure Laterality Date  ? WISDOM TOOTH EXTRACTION    ? ?Family History  ?Problem Relation Age of Onset  ? Heart disease Mother   ?     pacemaker  ? Diabetes Mother   ? Heart disease Father   ?     myocardial infarction  ? Hypertension Father   ? Diabetes Father   ? CVA Father   ? Colon cancer Neg Hx   ? Prostate cancer Neg Hx   ? ?Social History  ? ?Socioeconomic History  ? Marital status: Married  ?  Spouse name: Not on file  ? Number of children: Not on file  ? Years of education: Not on file  ? Highest  education level: Not on file  ?Occupational History  ?  Employer: [employer]  ?Tobacco Use  ? Smoking status: Never  ? Smokeless tobacco: Never  ?Vaping Use  ? Vaping Use: Never used  ?Substance and Sexual Activity  ? Alcohol use: Yes  ?  Alcohol/week: 0.0 standard drinks  ?  Comment: 2-3 beers daily  ? Drug use: No  ? Sexual activity: Not on file  ?Other Topics Concern  ? Not on file  ?Social History Narrative  ? Not on file  ? ?Social Determinants of Health  ? ?Financial Resource Strain: Low Risk   ? Difficulty of Paying Living Expenses: Not hard at all  ?Food Insecurity: No Food Insecurity  ? Worried About Programme researcher, broadcasting/film/video in the Last Year: Never true  ? Ran Out of Food in the Last Year: Never true  ?Transportation Needs: No Transportation Needs  ? Lack of Transportation (Medical): No  ? Lack of Transportation (Non-Medical): No  ?Physical Activity: Not on file  ?Stress: No Stress Concern Present  ? Feeling of Stress : Not at all  ?Social Connections: Unknown  ? Frequency of Communication with Friends and Family:  Not on file  ? Frequency of Social Gatherings with Friends and Family: Not on file  ? Attends Religious Services: Not on file  ? Active Member of Clubs or Organizations: Not on file  ? Attends Banker Meetings: Not on file  ? Marital Status: Married  ? ? ? ?Review of Systems  ?Constitutional:  Negative for appetite change and unexpected weight change.  ?HENT:  Negative for congestion and sinus pressure.   ?Respiratory:  Negative for cough, chest tightness and shortness of breath.   ?Cardiovascular:  Negative for chest pain, palpitations and leg swelling.  ?Gastrointestinal:  Negative for abdominal pain, diarrhea, nausea and vomiting.  ?Genitourinary:  Negative for difficulty urinating and dysuria.  ?Musculoskeletal:  Negative for joint swelling and myalgias.  ?Skin:  Negative for color change and rash.  ?Neurological:  Negative for dizziness, light-headedness and headaches.   ?Psychiatric/Behavioral:  Negative for agitation and dysphoric mood.   ? ?   ?Objective:  ?  ? ?BP 128/80 (BP Location: Left Arm, Patient Position: Sitting, Cuff Size: Small)   Pulse 84   Temp 98.1 ?F (36.7 ?C) (Temporal)   Ht 5\' 10"  (1.778 m)   Wt 155 lb (70.3 kg)   SpO2 98%   BMI 22.24 kg/m?  ?Wt Readings from Last 3 Encounters:  ?05/19/21 155 lb (70.3 kg)  ?05/12/21 155 lb (70.3 kg)  ?01/08/21 153 lb 4 oz (69.5 kg)  ? ? ?Physical Exam ?Constitutional:   ?   General: He is not in acute distress. ?   Appearance: Normal appearance. He is well-developed.  ?HENT:  ?   Head: Normocephalic and atraumatic.  ?   Right Ear: External ear normal.  ?   Left Ear: External ear normal.  ?Eyes:  ?   General: No scleral icterus.    ?   Right eye: No discharge.     ?   Left eye: No discharge.  ?Cardiovascular:  ?   Rate and Rhythm: Normal rate and regular rhythm.  ?Pulmonary:  ?   Effort: Pulmonary effort is normal. No respiratory distress.  ?   Breath sounds: Normal breath sounds.  ?Abdominal:  ?   General: Bowel sounds are normal.  ?   Palpations: Abdomen is soft.  ?   Tenderness: There is no abdominal tenderness.  ?Musculoskeletal:     ?   General: No swelling or tenderness.  ?   Cervical back: Neck supple. No tenderness.  ?Lymphadenopathy:  ?   Cervical: No cervical adenopathy.  ?Skin: ?   Findings: No erythema or rash.  ?Neurological:  ?   Mental Status: He is alert.  ?Psychiatric:     ?   Mood and Affect: Mood normal.     ?   Behavior: Behavior normal.  ? ? ? ?Outpatient Encounter Medications as of 05/12/2021  ?Medication Sig  ? acetaminophen (TYLENOL) 500 MG tablet Take 500 mg by mouth every 6 (six) hours as needed. 2-3 daily  ? amLODipine (NORVASC) 2.5 MG tablet TAKE 1 TABLET(2.5 MG) BY MOUTH DAILY  ? losartan (COZAAR) 100 MG tablet TAKE 1 TABLET BY MOUTH DAILY  ? pantoprazole (PROTONIX) 40 MG tablet TAKE 1 TABLET BY MOUTH DAILY  ? rosuvastatin (CRESTOR) 10 MG tablet TAKE 1 TABLET BY MOUTH DAILY  ? aspirin EC 81 MG  tablet Take 81 mg by mouth daily. (Patient not taking: Reported on 05/12/2021)  ? ?No facility-administered encounter medications on file as of 05/12/2021.  ?  ? ?Lab Results  ?Component  Value Date  ? WBC 4.9 05/12/2021  ? HGB 13.7 05/12/2021  ? HCT 40.0 05/12/2021  ? PLT 246.0 05/12/2021  ? GLUCOSE 93 05/12/2021  ? CHOL 176 05/12/2021  ? TRIG 105.0 05/12/2021  ? HDL 86.40 05/12/2021  ? LDLDIRECT 144.2 08/22/2012  ? LDLCALC 69 05/12/2021  ? ALT 37 05/12/2021  ? AST 47 (H) 05/12/2021  ? NA 135 05/12/2021  ? K 4.8 05/12/2021  ? CL 98 05/12/2021  ? CREATININE 0.91 05/12/2021  ? BUN 13 05/12/2021  ? CO2 27 05/12/2021  ? TSH 1.38 01/07/2021  ? PSA 2.09 01/07/2021  ? ? ?US Extrem Low Right Ltd ? ?Result Date: 06/05/2019 ?CLINICAL DATA:  Bulbous lesion posterior RIGHT thigh. Status post dog bite 3 weeks ago. EXAM: ULTRASOUND RIGHT LOWER EXTREMITY LIMITED TECHNIQUE: Ultrasound examination of the lower extremity soft tissues was performed in the area of clinical concern. COMPARISON:  None. FINDINGS: Complex collection within the RIGHT posterior-lateral thigh, measuring 5.3 x 2.2 x 4.5 cm, without internal vascularity, most likely hematoma. No surround hypervascularity to suggest abscess. IMPRESSION: Probable hematoma within the soft tissues of the RIGHT posterior-lateral thigh, measuring 5.3 cm greatest dimension, much less likely to represent abscess. Electronically Signed   By: Bary RichardStan  Maynard M.D.   On: 06/05/2019 14:14  ? ? ?   ?Assessment & Plan:  ? ?Problem List Items Addressed This Visit   ? ? Decreased white blood cell count  ?  Follow cbc.  ? ?  ?  ? Essential (primary) hypertension - Primary  ?  Continue amlodipine and losartan.  Blood pressure dong well.  Follow pressures.  Follow metabolic panel.  ? ?  ?  ? Relevant Orders  ? Basic Metabolic Panel (BMET) (Completed)  ? GERD (gastroesophageal reflux disease)  ?  Increased acid reflux despite protonix.  Add pepcid as directed.  Refer to GI given persistent reflux despite  PPI.  Also with history of iron deficiency, needs f/u with GI.  ? ?  ?  ? Relevant Orders  ? Ambulatory referral to Gastroenterology  ? H/O syncope  ?  No actual syncopal episodes recently.  Did have the two episode

## 2021-05-12 NOTE — Patient Instructions (Signed)
Start pepcid 20mg  - take 30 minutes before breakfast ?

## 2021-05-13 LAB — HEPATITIS C ANTIBODY
Hepatitis C Ab: NONREACTIVE
SIGNAL TO CUT-OFF: 0.03 (ref <1.00)

## 2021-05-14 ENCOUNTER — Other Ambulatory Visit: Payer: Self-pay

## 2021-05-14 DIAGNOSIS — E7800 Pure hypercholesterolemia, unspecified: Secondary | ICD-10-CM

## 2021-05-19 ENCOUNTER — Ambulatory Visit (INDEPENDENT_AMBULATORY_CARE_PROVIDER_SITE_OTHER): Payer: Medicare PPO

## 2021-05-19 VITALS — Ht 70.0 in | Wt 155.0 lb

## 2021-05-19 DIAGNOSIS — Z0000 Encounter for general adult medical examination without abnormal findings: Secondary | ICD-10-CM

## 2021-05-19 NOTE — Patient Instructions (Addendum)
?  Scott Cohen , ?Thank you for taking time to come for your Medicare Wellness Visit. I appreciate your ongoing commitment to your health goals. Please review the following plan we discussed and let me know if I can assist you in the future.  ? ?These are the goals we discussed: ? Goals   ? ?  Complete Advanced Care Planning   ? ?  ?  ?This is a list of the screening recommended for you and due dates:  ?Health Maintenance  ?Topic Date Due  ? COVID-19 Vaccine (4 - Booster for Pfizer series) 05/28/2021*  ? Zoster (Shingles) Vaccine (2 of 2) 08/11/2021*  ? Pneumonia Vaccine (2 - PPSV23 if available, else PCV20) 05/13/2022*  ? Flu Shot  08/31/2021  ? Colon Cancer Screening  01/06/2028  ? Tetanus Vaccine  05/17/2029  ? Hepatitis C Screening: USPSTF Recommendation to screen - Ages 32-79 yo.  Completed  ? HPV Vaccine  Aged Out  ?*Topic was postponed. The date shown is not the original due date.  ?  ?

## 2021-05-19 NOTE — Progress Notes (Signed)
Subjective:   Scott Cohen is a 71 y.o. male who presents for an Initial Medicare Annual Wellness Visit.  Review of Systems    No ROS.  Medicare Wellness Virtual Visit.  Visual/audio telehealth visit, UTA vital signs.   See social history for additional risk factors.   Cardiac Risk Factors include: advanced age (>39men, >48 women);male gender     Objective:    Today's Vitals   05/19/21 0934  Weight: 155 lb (70.3 kg)  Height: 5\' 10"  (1.778 m)   Body mass index is 22.24 kg/m.     05/19/2021    9:34 AM 06/05/2019   12:57 PM 05/18/2019    2:40 PM 07/09/2015   11:56 AM  Advanced Directives  Does Patient Have a Medical Advance Directive? No No No No  Would patient like information on creating a medical advance directive? No - Patient declined No - Guardian declined  No - patient declined information    Current Medications (verified) Outpatient Encounter Medications as of 05/19/2021  Medication Sig   acetaminophen (TYLENOL) 500 MG tablet Take 500 mg by mouth every 6 (six) hours as needed. 2-3 daily   amLODipine (NORVASC) 2.5 MG tablet TAKE 1 TABLET(2.5 MG) BY MOUTH DAILY   famotidine (PEPCID) 20 MG tablet Take 20 mg by mouth 2 (two) times daily.   losartan (COZAAR) 100 MG tablet TAKE 1 TABLET BY MOUTH DAILY   pantoprazole (PROTONIX) 40 MG tablet TAKE 1 TABLET BY MOUTH DAILY   rosuvastatin (CRESTOR) 10 MG tablet TAKE 1 TABLET BY MOUTH DAILY   aspirin EC 81 MG tablet Take 81 mg by mouth daily. (Patient not taking: Reported on 05/12/2021)   No facility-administered encounter medications on file as of 05/19/2021.    Allergies (verified) Patient has no known allergies.   History: Past Medical History:  Diagnosis Date   Hypercholesterolemia    Hypertension    Leukopenia    benign cyclical   Past Surgical History:  Procedure Laterality Date   WISDOM TOOTH EXTRACTION     Family History  Problem Relation Age of Onset   Heart disease Mother        pacemaker   Diabetes  Mother    Heart disease Father        myocardial infarction   Hypertension Father    Diabetes Father    CVA Father    Colon cancer Neg Hx    Prostate cancer Neg Hx    Social History   Socioeconomic History   Marital status: Married    Spouse name: Not on file   Number of children: Not on file   Years of education: Not on file   Highest education level: Not on file  Occupational History    Employer: [employer]  Tobacco Use   Smoking status: Never   Smokeless tobacco: Never  Vaping Use   Vaping Use: Never used  Substance and Sexual Activity   Alcohol use: Yes    Alcohol/week: 0.0 standard drinks    Comment: 2-3 beers daily   Drug use: No   Sexual activity: Not on file  Other Topics Concern   Not on file  Social History Narrative   Not on file   Social Determinants of Health   Financial Resource Strain: Low Risk    Difficulty of Paying Living Expenses: Not hard at all  Food Insecurity: No Food Insecurity   Worried About Running Out of Food in the Last Year: Never true   Ran Out of  Food in the Last Year: Never true  Transportation Needs: No Transportation Needs   Lack of Transportation (Medical): No   Lack of Transportation (Non-Medical): No  Physical Activity: Not on file  Stress: No Stress Concern Present   Feeling of Stress : Not at all  Social Connections: Unknown   Frequency of Communication with Friends and Family: Not on file   Frequency of Social Gatherings with Friends and Family: Not on file   Attends Religious Services: Not on file   Active Member of Clubs or Organizations: Not on file   Attends Banker Meetings: Not on file   Marital Status: Married    Tobacco Counseling Counseling given: Not Answered   Clinical Intake:  Pre-visit preparation completed: Yes        Diabetes: No  How often do you need to have someone help you when you read instructions, pamphlets, or other written materials from your doctor or pharmacy?:  1 - Never    Interpreter Needed?: No      Activities of Daily Living    05/19/2021    9:40 AM  In your present state of health, do you have any difficulty performing the following activities:  Hearing? 0  Vision? 0  Difficulty concentrating or making decisions? 0  Walking or climbing stairs? 0  Dressing or bathing? 0  Doing errands, shopping? 0  Preparing Food and eating ? N  Using the Toilet? N  In the past six months, have you accidently leaked urine? N  Do you have problems with loss of bowel control? N  Managing your Medications? N  Managing your Finances? N  Housekeeping or managing your Housekeeping? N    Patient Care Team: Dale [place], MD as PCP - General (Internal Medicine)  Indicate any recent Medical Services you may have received from other than [facility] providers in the past year (date may be approximate).     Assessment:   This is a routine wellness examination for Scott Cohen.   Virtual Visit via Telephone Note  I connected with  Scott Cohen on 05/19/21 at  9:30 AM EDT by telephone and verified that I am speaking with the correct person using two identifiers.  Persons participating in the virtual visit: patient/Nurse Health Advisor   I discussed the limitations of performing an evaluation and management service by telehealth. The patient expressed understanding and agreed to proceed. We continued and completed visit with audio only. Some vital signs may be absent or patient reported.   Hearing/Vision screen Hearing Screening - Comments:: Patient is able to hear conversational tones without difficulty.  No issues reported. Vision Screening - Comments:: Followed by Dr. Randal Buba, [place] Wears corrective lenses They have seen their ophthalmologist in the last 12 months.    Dietary issues and exercise activities discussed: Current Exercise Habits: Home exercise routine, Intensity: MildHealthy diet Good water intake   Goals Addressed              This Visit's Progress    Complete Advanced Care Planning         Depression Screen    05/19/2021    9:40 AM 05/12/2021   10:37 AM 01/08/2021    9:57 AM 09/07/2020    2:07 PM 10/28/2019    8:29 AM 10/17/2018    9:10 AM 08/19/2016    9:29 AM  PHQ 2/9 Scores  PHQ - 2 Score 0 0 0 0 0 0 0  PHQ- 9 Score  3    Fall Risk    05/19/2021    9:40 AM 05/12/2021   10:37 AM 01/08/2021    9:57 AM 09/07/2020    2:07 PM 06/03/2019    4:05 PM  Fall Risk   Falls in the past year? 0 0 0 1 0  Number falls in past yr: 0  0 1   Injury with Fall?   0 0   Risk for fall due to :  No Fall Risks No Fall Risks    Follow up Falls evaluation completed Falls evaluation completed Falls evaluation completed Falls evaluation completed Falls evaluation completed    FALL RISK PREVENTION PERTAINING TO THE HOME:  Home free of loose throw rugs in walkways, pet beds, electrical cords, etc? Yes  Adequate lighting in your home to reduce risk of falls? Yes   ASSISTIVE DEVICES UTILIZED TO PREVENT FALLS: Life alert? No  Use of a cane, walker or w/c? No   TIMED UP AND GO: Was the test performed? No .   Cognitive Function:  Patient is alert and oriented x3.       Immunizations Immunization History  Administered Date(s) Administered   Fluad Quad(high Dose 65+) 10/18/2018, 10/29/2019   Influenza Split 02/09/2012, 12/10/2012   Influenza, High Dose Seasonal PF 01/03/2017   Influenza,inj,Quad PF,6+ Mos 01/07/2015   Influenza-Unspecified 11/27/2017   MMR 12/07/2017   PFIZER(Purple Top)SARS-COV-2 Vaccination 03/18/2019, 04/08/2019, 01/14/2020   Pneumococcal Conjugate-13 11/30/2018   Tdap 05/18/2019   Zoster Recombinat (Shingrix) 12/15/2018   Screening Tests Health Maintenance  Topic Date Due   COVID-19 Vaccine (4 - Booster for Pfizer series) 05/28/2021 (Originally 03/10/2020)   Zoster Vaccines- Shingrix (2 of 2) 08/11/2021 (Originally 02/09/2019)   Pneumonia Vaccine 63+ Years old (2 - PPSV23 if available, else  PCV20) 05/13/2022 (Originally 11/30/2019)   INFLUENZA VACCINE  08/31/2021   COLONOSCOPY (Pts 45-25yrs Insurance coverage will need to be confirmed)  01/06/2028   TETANUS/TDAP  05/17/2029   Hepatitis C Screening  Completed   HPV VACCINES  Aged Out   Health Maintenance There are no preventive care reminders to display for this patient.  Lung Cancer Screening: (Low Dose CT Chest recommended if Age 63-80 years, 30 pack-year currently smoking OR have quit w/in 15years.) does not qualify.   Vision Screening: Recommended annual ophthalmology exams for early detection of glaucoma and other disorders of the eye.  Dental Screening: Recommended annual dental exams for proper oral hygiene  Community Resource Referral / Chronic Care Management: CRR required this visit?  No   CCM required this visit?  No      Plan:   Keep all routine maintenance appointments.   I have personally reviewed and noted the following in the patient's chart:   Medical and social history Use of alcohol, tobacco or illicit drugs  Current medications and supplements including opioid prescriptions. Patient is not currently taking opioid prescriptions. Functional ability and status Nutritional status Physical activity Advanced directives List of other physicians Hospitalizations, surgeries, and ER visits in previous 12 months Vitals Screenings to include cognitive, depression, and falls Referrals and appointments  In addition, I have reviewed and discussed with patient certain preventive protocols, quality metrics, and best practice recommendations. A written personalized care plan for preventive services as well as general preventive health recommendations were provided to patient.     Ashok Pall, LPN   [id number]

## 2021-05-22 ENCOUNTER — Encounter: Payer: Self-pay | Admitting: Internal Medicine

## 2021-05-22 NOTE — Assessment & Plan Note (Signed)
Continue amlodipine and losartan.  Blood pressure dong well.  Follow pressures.  Follow metabolic panel.  

## 2021-05-22 NOTE — Assessment & Plan Note (Signed)
On crestor.  Low cholesterol diet and exercise.  Follow lipid panel and liver function tests.   

## 2021-05-22 NOTE — Assessment & Plan Note (Signed)
Some better. Has retired.  Follow.  ?

## 2021-05-22 NOTE — Assessment & Plan Note (Signed)
Follow cbc.  

## 2021-05-22 NOTE — Assessment & Plan Note (Signed)
Increased acid reflux despite protonix.  Add pepcid as directed.  Refer to GI given persistent reflux despite PPI.  Also with history of iron deficiency, needs f/u with GI.  ?

## 2021-05-22 NOTE — Assessment & Plan Note (Signed)
No actual syncopal episodes recently.  Did have the two episodes of near syncope.  Knows maneuvers to prevent syncope (lying down and putting feet up, etc).  Can sense when going to occur.  Discussed further w/up and evaluation.  Has seen cardiology and neurology previously.  Given persistent recurring episodes of near syncope, will refer back to cardiology - question of need for further EP evaluation, etc.   ?

## 2021-05-22 NOTE — Assessment & Plan Note (Signed)
On iron.  With history of iron deficient anemia and with persistent reflux despite PPI, will refer back to GI for further evaluation.   ?

## 2021-06-02 ENCOUNTER — Other Ambulatory Visit: Payer: Self-pay | Admitting: Internal Medicine

## 2021-06-15 ENCOUNTER — Other Ambulatory Visit (INDEPENDENT_AMBULATORY_CARE_PROVIDER_SITE_OTHER): Payer: Medicare PPO

## 2021-06-15 ENCOUNTER — Other Ambulatory Visit: Payer: Medicare PPO

## 2021-06-15 DIAGNOSIS — R55 Syncope and collapse: Secondary | ICD-10-CM | POA: Diagnosis not present

## 2021-06-15 DIAGNOSIS — E7800 Pure hypercholesterolemia, unspecified: Secondary | ICD-10-CM

## 2021-06-15 DIAGNOSIS — R0789 Other chest pain: Secondary | ICD-10-CM | POA: Diagnosis not present

## 2021-06-15 DIAGNOSIS — F439 Reaction to severe stress, unspecified: Secondary | ICD-10-CM | POA: Diagnosis not present

## 2021-06-15 DIAGNOSIS — R002 Palpitations: Secondary | ICD-10-CM | POA: Diagnosis not present

## 2021-06-15 DIAGNOSIS — K219 Gastro-esophageal reflux disease without esophagitis: Secondary | ICD-10-CM | POA: Diagnosis not present

## 2021-06-15 DIAGNOSIS — I10 Essential (primary) hypertension: Secondary | ICD-10-CM | POA: Diagnosis not present

## 2021-06-15 LAB — HEPATIC FUNCTION PANEL
ALT: 40 U/L (ref 0–53)
AST: 38 U/L — ABNORMAL HIGH (ref 0–37)
Albumin: 4.5 g/dL (ref 3.5–5.2)
Alkaline Phosphatase: 87 U/L (ref 39–117)
Bilirubin, Direct: 0.1 mg/dL (ref 0.0–0.3)
Total Bilirubin: 0.4 mg/dL (ref 0.2–1.2)
Total Protein: 6.8 g/dL (ref 6.0–8.3)

## 2021-07-07 DIAGNOSIS — R55 Syncope and collapse: Secondary | ICD-10-CM | POA: Diagnosis not present

## 2021-07-07 DIAGNOSIS — R002 Palpitations: Secondary | ICD-10-CM | POA: Diagnosis not present

## 2021-07-13 ENCOUNTER — Ambulatory Visit: Payer: Medicare PPO | Admitting: Internal Medicine

## 2021-07-19 DIAGNOSIS — R55 Syncope and collapse: Secondary | ICD-10-CM | POA: Diagnosis not present

## 2021-07-19 DIAGNOSIS — R002 Palpitations: Secondary | ICD-10-CM | POA: Diagnosis not present

## 2021-08-06 ENCOUNTER — Ambulatory Visit: Payer: Medicare PPO | Admitting: Internal Medicine

## 2021-08-06 DIAGNOSIS — I10 Essential (primary) hypertension: Secondary | ICD-10-CM

## 2021-08-06 DIAGNOSIS — E7800 Pure hypercholesterolemia, unspecified: Secondary | ICD-10-CM | POA: Diagnosis not present

## 2021-08-06 DIAGNOSIS — E611 Iron deficiency: Secondary | ICD-10-CM

## 2021-08-06 DIAGNOSIS — K219 Gastro-esophageal reflux disease without esophagitis: Secondary | ICD-10-CM | POA: Diagnosis not present

## 2021-08-06 DIAGNOSIS — F439 Reaction to severe stress, unspecified: Secondary | ICD-10-CM

## 2021-08-06 DIAGNOSIS — Z87898 Personal history of other specified conditions: Secondary | ICD-10-CM | POA: Diagnosis not present

## 2021-08-06 NOTE — Progress Notes (Unsigned)
Patient ID: Scott Cohen, male   DOB: Sep 28, 1950, 71 y.o.   MRN: [id number]   Subjective:    Patient ID: Scott Cohen, male    DOB: [birth year], 71 y.o.   MRN: [id number]  This visit occurred during the SARS-CoV-2 public health emergency.  Safety protocols were in place, including screening questions prior to the visit, additional usage of staff PPE, and extensive cleaning of exam room while observing appropriate contact time as indicated for disinfecting solutions.   Patient here for  Chief Complaint  Patient presents with   Gastroesophageal Reflux   Hypertension   .   HPI    Past Medical History:  Diagnosis Date   Hypercholesterolemia    Hypertension    Leukopenia    benign cyclical   Past Surgical History:  Procedure Laterality Date   WISDOM TOOTH EXTRACTION     Family History  Problem Relation Age of Onset   Heart disease Mother        pacemaker   Diabetes Mother    Heart disease Father        myocardial infarction   Hypertension Father    Diabetes Father    CVA Father    Colon cancer Neg Hx    Prostate cancer Neg Hx    Social History   Socioeconomic History   Marital status: Married    Spouse name: Not on file   Number of children: Not on file   Years of education: Not on file   Highest education level: Not on file  Occupational History    Employer: [employer]  Tobacco Use   Smoking status: Never   Smokeless tobacco: Never  Vaping Use   Vaping Use: Never used  Substance and Sexual Activity   Alcohol use: Yes    Alcohol/week: 0.0 standard drinks of alcohol    Comment: 2-3 beers daily   Drug use: No   Sexual activity: Not on file  Other Topics Concern   Not on file  Social History Narrative   Not on file   Social Determinants of Health   Financial Resource Strain: Low Risk  (05/19/2021)   Overall Financial Resource Strain (CARDIA)    Difficulty of Paying Living Expenses: Not hard at all  Food Insecurity: No Food Insecurity (05/19/2021)    Hunger Vital Sign    Worried About Running Out of Food in the Last Year: Never true    Ran Out of Food in the Last Year: Never true  Transportation Needs: No Transportation Needs (05/19/2021)   PRAPARE - Administrator, Civil Service (Medical): No    Lack of Transportation (Non-Medical): No  Physical Activity: Not on file  Stress: No Stress Concern Present (05/19/2021)   [employer] of Occupational Health - Occupational Stress Questionnaire    Feeling of Stress : Not at all  Social Connections: Unknown (05/19/2021)   Social Connection and Isolation Panel [NHANES]    Frequency of Communication with Friends and Family: Not on file    Frequency of Social Gatherings with Friends and Family: Not on file    Attends Religious Services: Not on file    Active Member of Clubs or Organizations: Not on file    Attends Banker Meetings: Not on file    Marital Status: Married     Review of Systems     Objective:     BP 122/68 (BP Location: Left Arm, Patient Position: Sitting, Cuff Size: Small)   Pulse 78  Temp 98.7 F (37.1 C) (Temporal)   Resp 15   Ht 5\' 10"  (1.778 m)   Wt 150 lb 9.6 oz (68.3 kg)   SpO2 97%   BMI 21.61 kg/m  Wt Readings from Last 3 Encounters:  08/06/21 150 lb 9.6 oz (68.3 kg)  05/19/21 155 lb (70.3 kg)  05/12/21 155 lb (70.3 kg)    Physical Exam   Outpatient Encounter Medications as of 08/06/2021  Medication Sig   acetaminophen (TYLENOL) 500 MG tablet Take 500 mg by mouth every 6 (six) hours as needed. 2-3 daily   amLODipine (NORVASC) 2.5 MG tablet TAKE 1 TABLET(2.5 MG) BY MOUTH DAILY   aspirin EC 81 MG tablet Take 81 mg by mouth daily.   famotidine (PEPCID) 20 MG tablet Take 20 mg by mouth daily.   losartan (COZAAR) 100 MG tablet TAKE 1 TABLET BY MOUTH DAILY   pantoprazole (PROTONIX) 40 MG tablet TAKE 1 TABLET BY MOUTH DAILY   rosuvastatin (CRESTOR) 10 MG tablet TAKE 1 TABLET BY MOUTH DAILY   No facility-administered encounter  medications on file as of 08/06/2021.     Lab Results  Component Value Date   WBC 4.9 05/12/2021   HGB 13.7 05/12/2021   HCT 40.0 05/12/2021   PLT 246.0 05/12/2021   GLUCOSE 93 05/12/2021   CHOL 176 05/12/2021   TRIG 105.0 05/12/2021   HDL 86.40 05/12/2021   LDLDIRECT 144.2 08/22/2012   LDLCALC 69 05/12/2021   ALT 40 06/15/2021   AST 38 (H) 06/15/2021   NA 135 05/12/2021   K 4.8 05/12/2021   CL 98 05/12/2021   CREATININE 0.91 05/12/2021   BUN 13 05/12/2021   CO2 27 05/12/2021   TSH 1.38 01/07/2021   PSA 2.09 01/07/2021    14/08/2020 Extrem Low Right Ltd  Result Date: 06/05/2019 CLINICAL DATA:  Bulbous lesion posterior RIGHT thigh. Status post dog bite 3 weeks ago. EXAM: ULTRASOUND RIGHT LOWER EXTREMITY LIMITED TECHNIQUE: Ultrasound examination of the lower extremity soft tissues was performed in the area of clinical concern. COMPARISON:  None. FINDINGS: Complex collection within the RIGHT posterior-lateral thigh, measuring 5.3 x 2.2 x 4.5 cm, without internal vascularity, most likely hematoma. No surround hypervascularity to suggest abscess. IMPRESSION: Probable hematoma within the soft tissues of the RIGHT posterior-lateral thigh, measuring 5.3 cm greatest dimension, much less likely to represent abscess. Electronically Signed   By: 08/05/2019 M.D.   On: 06/05/2019 14:14       Assessment & Plan:   Problem List Items Addressed This Visit   None    08/05/2019, MD

## 2021-08-08 ENCOUNTER — Encounter: Payer: Self-pay | Admitting: Internal Medicine

## 2021-08-08 ENCOUNTER — Telehealth: Payer: Self-pay | Admitting: Internal Medicine

## 2021-08-08 NOTE — Assessment & Plan Note (Signed)
On crestor.  Low cholesterol diet and exercise.  Follow lipid panel and liver function tests.   

## 2021-08-08 NOTE — Assessment & Plan Note (Signed)
On iron.  With history of iron deficient anemia and with persistent reflux despite PPI last visit,  Was referred back to GI for further evaluation.  Will need to reschedule appt.

## 2021-08-08 NOTE — Assessment & Plan Note (Signed)
Controlled on protonix and pepcid.  Continue current regimen for now.  Also, will need to reschedule his GI appt given never had EGD and with history of iron deficiency.

## 2021-08-08 NOTE — Assessment & Plan Note (Signed)
Some better. Has retired.  Overall appears to be handling things relatively well.  Follow.

## 2021-08-08 NOTE — Assessment & Plan Note (Signed)
Continue amlodipine and losartan.  Blood pressure dong well.  Follow pressures.  Follow metabolic panel.  

## 2021-08-08 NOTE — Assessment & Plan Note (Signed)
No syncope or near syncopal episodes recently.  Saw cardiology.  W/up as outlined.  Obtain results of monitor and stress test.  Follow.

## 2021-08-08 NOTE — Telephone Encounter (Signed)
Need copy of results of stress test and heart monitor faxed to [place].  I can see his echo results, but unable to see these two results. Please call [facility] cardiology and have them send a copy to [place].  Also, notify pt that he needs his appt with GI rescheduled.  (Given his history of iron deficiency and with history of reflux). Referral was already placed and he was scheduled to see Dr Norma Fredrickson.  Needs to be rescheduled.

## 2021-08-12 NOTE — Telephone Encounter (Signed)
Pt scheduled w/ Dr Norma Fredrickson 10/16 at 2pm - pt advised

## 2021-08-12 NOTE — Telephone Encounter (Signed)
Results from [facility] being faxed.

## 2021-08-16 ENCOUNTER — Ambulatory Visit: Payer: Medicare PPO | Admitting: Internal Medicine

## 2021-09-06 DIAGNOSIS — R0789 Other chest pain: Secondary | ICD-10-CM | POA: Diagnosis not present

## 2021-09-06 DIAGNOSIS — K219 Gastro-esophageal reflux disease without esophagitis: Secondary | ICD-10-CM | POA: Diagnosis not present

## 2021-09-06 DIAGNOSIS — I10 Essential (primary) hypertension: Secondary | ICD-10-CM | POA: Diagnosis not present

## 2021-09-06 DIAGNOSIS — R55 Syncope and collapse: Secondary | ICD-10-CM | POA: Diagnosis not present

## 2021-09-06 DIAGNOSIS — R002 Palpitations: Secondary | ICD-10-CM | POA: Diagnosis not present

## 2021-09-07 ENCOUNTER — Other Ambulatory Visit: Payer: Self-pay | Admitting: Internal Medicine

## 2021-09-08 ENCOUNTER — Telehealth: Payer: Self-pay | Admitting: Internal Medicine

## 2021-09-08 NOTE — Telephone Encounter (Signed)
Patient stated he reorder all is prescription Monday.  [facility] stated 3 were ready and 1 had to be re-authorized.   He looked in [place] and saw in an after visit summary that his medications have changed.   He wants to clarify/verify what he is supposed to be taking and what was removed

## 2021-09-09 ENCOUNTER — Other Ambulatory Visit: Payer: Self-pay

## 2021-09-09 NOTE — Telephone Encounter (Signed)
Pt advised no changes have been. Pt is still on rosuvastatin, and should continue taking

## 2021-11-03 ENCOUNTER — Other Ambulatory Visit: Payer: Medicare PPO

## 2021-11-08 ENCOUNTER — Ambulatory Visit: Payer: Medicare PPO | Admitting: Internal Medicine

## 2021-11-12 ENCOUNTER — Other Ambulatory Visit (INDEPENDENT_AMBULATORY_CARE_PROVIDER_SITE_OTHER): Payer: Medicare PPO

## 2021-11-12 ENCOUNTER — Other Ambulatory Visit: Payer: Medicare PPO

## 2021-11-12 DIAGNOSIS — E7800 Pure hypercholesterolemia, unspecified: Secondary | ICD-10-CM

## 2021-11-12 DIAGNOSIS — I10 Essential (primary) hypertension: Secondary | ICD-10-CM

## 2021-11-12 DIAGNOSIS — E611 Iron deficiency: Secondary | ICD-10-CM | POA: Diagnosis not present

## 2021-11-12 LAB — CBC WITH DIFFERENTIAL/PLATELET
Basophils Absolute: 0 10*3/uL (ref 0.0–0.1)
Basophils Relative: 0.6 % (ref 0.0–3.0)
Eosinophils Absolute: 0 10*3/uL (ref 0.0–0.7)
Eosinophils Relative: 0.8 % (ref 0.0–5.0)
HCT: 39.6 % (ref 39.0–52.0)
Hemoglobin: 13.6 g/dL (ref 13.0–17.0)
Lymphocytes Relative: 33.1 % (ref 12.0–46.0)
Lymphs Abs: 1.3 10*3/uL (ref 0.7–4.0)
MCHC: 34.2 g/dL (ref 30.0–36.0)
MCV: 91.8 fl (ref 78.0–100.0)
Monocytes Absolute: 0.4 10*3/uL (ref 0.1–1.0)
Monocytes Relative: 9.3 % (ref 3.0–12.0)
Neutro Abs: 2.3 10*3/uL (ref 1.4–7.7)
Neutrophils Relative %: 56.2 % (ref 43.0–77.0)
Platelets: 240 10*3/uL (ref 150.0–400.0)
RBC: 4.32 Mil/uL (ref 4.22–5.81)
RDW: 12.6 % (ref 11.5–15.5)
WBC: 4.1 10*3/uL (ref 4.0–10.5)

## 2021-11-12 LAB — LIPID PANEL
Cholesterol: 169 mg/dL (ref 0–200)
HDL: 82.3 mg/dL (ref >39.00)
LDL Cholesterol: 70 mg/dL (ref 0–99)
NonHDL: 86.57
Total CHOL/HDL Ratio: 2
Triglycerides: 84 mg/dL (ref 0.0–149.0)
VLDL: 16.8 mg/dL (ref 0.0–40.0)

## 2021-11-12 LAB — HEPATIC FUNCTION PANEL
ALT: 17 U/L (ref 0–53)
AST: 22 U/L (ref 0–37)
Albumin: 4.5 g/dL (ref 3.5–5.2)
Alkaline Phosphatase: 64 U/L (ref 39–117)
Bilirubin, Direct: 0.2 mg/dL (ref 0.0–0.3)
Total Bilirubin: 0.6 mg/dL (ref 0.2–1.2)
Total Protein: 7.2 g/dL (ref 6.0–8.3)

## 2021-11-12 LAB — BASIC METABOLIC PANEL
BUN: 11 mg/dL (ref 6–23)
CO2: 27 mEq/L (ref 19–32)
Calcium: 9.4 mg/dL (ref 8.4–10.5)
Chloride: 96 mEq/L (ref 96–112)
Creatinine, Ser: 0.83 mg/dL (ref 0.40–1.50)
GFR: 88.26 mL/min (ref >60.00)
Glucose, Bld: 90 mg/dL (ref 70–99)
Potassium: 4.5 mEq/L (ref 3.5–5.1)
Sodium: 131 mEq/L — ABNORMAL LOW (ref 135–145)

## 2021-11-12 LAB — IBC + FERRITIN
Ferritin: 106 ng/mL (ref 22.0–322.0)
Iron: 111 ug/dL (ref 42–165)
Saturation Ratios: 26.4 % (ref 20.0–50.0)
TIBC: 420 ug/dL (ref 250.0–450.0)
Transferrin: 300 mg/dL (ref 212.0–360.0)

## 2021-11-15 DIAGNOSIS — K219 Gastro-esophageal reflux disease without esophagitis: Secondary | ICD-10-CM | POA: Diagnosis not present

## 2021-11-15 DIAGNOSIS — D509 Iron deficiency anemia, unspecified: Secondary | ICD-10-CM | POA: Diagnosis not present

## 2021-11-15 DIAGNOSIS — Z87898 Personal history of other specified conditions: Secondary | ICD-10-CM | POA: Diagnosis not present

## 2021-11-16 ENCOUNTER — Ambulatory Visit (INDEPENDENT_AMBULATORY_CARE_PROVIDER_SITE_OTHER): Payer: Medicare PPO | Admitting: Internal Medicine

## 2021-11-16 ENCOUNTER — Encounter: Payer: Self-pay | Admitting: Internal Medicine

## 2021-11-16 DIAGNOSIS — E611 Iron deficiency: Secondary | ICD-10-CM

## 2021-11-16 DIAGNOSIS — I10 Essential (primary) hypertension: Secondary | ICD-10-CM | POA: Diagnosis not present

## 2021-11-16 DIAGNOSIS — E871 Hypo-osmolality and hyponatremia: Secondary | ICD-10-CM | POA: Diagnosis not present

## 2021-11-16 DIAGNOSIS — K219 Gastro-esophageal reflux disease without esophagitis: Secondary | ICD-10-CM | POA: Diagnosis not present

## 2021-11-16 DIAGNOSIS — R0683 Snoring: Secondary | ICD-10-CM | POA: Diagnosis not present

## 2021-11-16 DIAGNOSIS — F439 Reaction to severe stress, unspecified: Secondary | ICD-10-CM

## 2021-11-16 DIAGNOSIS — Z87898 Personal history of other specified conditions: Secondary | ICD-10-CM

## 2021-11-16 DIAGNOSIS — Z23 Encounter for immunization: Secondary | ICD-10-CM

## 2021-11-16 DIAGNOSIS — E7800 Pure hypercholesterolemia, unspecified: Secondary | ICD-10-CM

## 2021-11-16 LAB — SODIUM: Sodium: 133 mEq/L — ABNORMAL LOW (ref 135–145)

## 2021-11-16 NOTE — Progress Notes (Signed)
Patient ID: Scott Cohen, male   DOB: 1951-01-12, 71 y.o.   MRN: [id number]   Subjective:    Patient ID: Scott Cohen, male    DOB: 09-Jan-1951, 71 y.o.   MRN: [id number]   Patient here for  Chief Complaint  Patient presents with   Follow-up    3 month f/u   Pt would also liek to discuss his appt on yesterday to [facility] about his acid reflux and his trouble sleeping    .   HPI Follow up regarding his blood pressure and cholesterol.   Saw GI - planning for EGD. Recommended protonix and pepcid.  Reports some occasional nausea.  Eating. F/u cardiology 09/06/21 - syncope - etiology unclear s/p holter, echo and stress test. Has not had any further episodes.  Also reports headaches - intermittent.  Also discussed - nocturia 2-3x per night.  Snores.  Wakes with dry mouth.  Discussed possible sleep apnea.  No chest pain or sob.  Decreased stress.    Past Medical History:  Diagnosis Date   Hypercholesterolemia    Hypertension    Leukopenia    benign cyclical   Past Surgical History:  Procedure Laterality Date   WISDOM TOOTH EXTRACTION     Family History  Problem Relation Age of Onset   Heart disease Mother        pacemaker   Diabetes Mother    Heart disease Father        myocardial infarction   Hypertension Father    Diabetes Father    CVA Father    Colon cancer Neg Hx    Prostate cancer Neg Hx    Social History   Socioeconomic History   Marital status: Married    Spouse name: Not on file   Number of children: Not on file   Years of education: Not on file   Highest education level: Not on file  Occupational History    Employer: [employer]  Tobacco Use   Smoking status: Never   Smokeless tobacco: Never  Vaping Use   Vaping Use: Never used  Substance and Sexual Activity   Alcohol use: Yes    Alcohol/week: 0.0 standard drinks of alcohol    Comment: 2-3 beers daily   Drug use: No   Sexual activity: Not on file  Other Topics Concern   Not on file  Social History  Narrative   Not on file   Social Determinants of Health   Financial Resource Strain: Low Risk  (05/19/2021)   Overall Financial Resource Strain (CARDIA)    Difficulty of Paying Living Expenses: Not hard at all  Food Insecurity: No Food Insecurity (05/19/2021)   Hunger Vital Sign    Worried About Running Out of Food in the Last Year: Never true    Ran Out of Food in the Last Year: Never true  Transportation Needs: No Transportation Needs (05/19/2021)   PRAPARE - Administrator, Civil Service (Medical): No    Lack of Transportation (Non-Medical): No  Physical Activity: Not on file  Stress: No Stress Concern Present (05/19/2021)   [employer] of Occupational Health - Occupational Stress Questionnaire    Feeling of Stress : Not at all  Social Connections: Unknown (05/19/2021)   Social Connection and Isolation Panel [NHANES]    Frequency of Communication with Friends and Family: Not on file    Frequency of Social Gatherings with Friends and Family: Not on file    Attends Religious Services: Not on  file    Active Member of Clubs or Organizations: Not on file    Attends Banker Meetings: Not on file    Marital Status: Married     Review of Systems  Constitutional:  Negative for appetite change and unexpected weight change.  HENT:  Negative for congestion and sinus pressure.   Respiratory:  Negative for cough, chest tightness and shortness of breath.   Cardiovascular:  Negative for chest pain, palpitations and leg swelling.  Gastrointestinal:  Positive for nausea. Negative for abdominal pain, diarrhea and vomiting.  Genitourinary:  Negative for difficulty urinating and dysuria.  Musculoskeletal:  Negative for joint swelling and myalgias.  Skin:  Negative for color change and rash.  Neurological:  Positive for headaches. Negative for dizziness.  Psychiatric/Behavioral:  Negative for agitation and dysphoric mood.        Objective:     BP 118/64 (BP  Location: Left Arm, Patient Position: Sitting, Cuff Size: Normal)   Pulse 67   Temp 97.8 F (36.6 C) (Oral)   Ht 5\' 10"  (1.778 m)   Wt 148 lb 9.6 oz (67.4 kg)   SpO2 99%   BMI 21.32 kg/m  Wt Readings from Last 3 Encounters:  11/16/21 148 lb 9.6 oz (67.4 kg)  08/06/21 150 lb 9.6 oz (68.3 kg)  05/19/21 155 lb (70.3 kg)    Physical Exam Constitutional:      General: He is not in acute distress.    Appearance: Normal appearance. He is well-developed.  HENT:     Head: Normocephalic and atraumatic.     Right Ear: External ear normal.     Left Ear: External ear normal.  Eyes:     General: No scleral icterus.       Right eye: No discharge.        Left eye: No discharge.  Cardiovascular:     Rate and Rhythm: Normal rate and regular rhythm.  Pulmonary:     Effort: Pulmonary effort is normal. No respiratory distress.     Breath sounds: Normal breath sounds.  Abdominal:     General: Bowel sounds are normal.     Palpations: Abdomen is soft.     Tenderness: There is no abdominal tenderness.  Musculoskeletal:        General: No swelling or tenderness.     Cervical back: Neck supple. No tenderness.  Lymphadenopathy:     Cervical: No cervical adenopathy.  Skin:    Findings: No erythema or rash.  Neurological:     Mental Status: He is alert.  Psychiatric:        Mood and Affect: Mood normal.        Behavior: Behavior normal.      Outpatient Encounter Medications as of 11/16/2021  Medication Sig   acetaminophen (TYLENOL) 500 MG tablet Take 500 mg by mouth every 6 (six) hours as needed. 2-3 daily   amLODipine (NORVASC) 2.5 MG tablet TAKE 1 TABLET(2.5 MG) BY MOUTH DAILY   aspirin EC 81 MG tablet Take 81 mg by mouth daily.   famotidine (PEPCID) 20 MG tablet Take 20 mg by mouth daily.   losartan (COZAAR) 100 MG tablet TAKE 1 TABLET BY MOUTH DAILY   pantoprazole (PROTONIX) 40 MG tablet TAKE 1 TABLET BY MOUTH EVERY DAY   rosuvastatin (CRESTOR) 10 MG tablet TAKE 1 TABLET BY MOUTH  DAILY   No facility-administered encounter medications on file as of 11/16/2021.     Lab Results  Component Value Date  WBC 4.1 11/12/2021   HGB 13.6 11/12/2021   HCT 39.6 11/12/2021   PLT 240.0 11/12/2021   GLUCOSE 90 11/12/2021   CHOL 169 11/12/2021   TRIG 84.0 11/12/2021   HDL 82.30 11/12/2021   LDLDIRECT 144.2 08/22/2012   LDLCALC 70 11/12/2021   ALT 17 11/12/2021   AST 22 11/12/2021   NA 133 (L) 11/16/2021   K 4.5 11/12/2021   CL 96 11/12/2021   CREATININE 0.83 11/12/2021   BUN 11 11/12/2021   CO2 27 11/12/2021   TSH 1.38 01/07/2021   PSA 2.09 01/07/2021    [place] Extrem Low Right Ltd  Result Date: 06/05/2019 CLINICAL DATA:  Bulbous lesion posterior RIGHT thigh. Status post dog bite 3 weeks ago. EXAM: ULTRASOUND RIGHT LOWER EXTREMITY LIMITED TECHNIQUE: Ultrasound examination of the lower extremity soft tissues was performed in the area of clinical concern. COMPARISON:  None. FINDINGS: Complex collection within the RIGHT posterior-lateral thigh, measuring 5.3 x 2.2 x 4.5 cm, without internal vascularity, most likely hematoma. No surround hypervascularity to suggest abscess. IMPRESSION: Probable hematoma within the soft tissues of the RIGHT posterior-lateral thigh, measuring 5.3 cm greatest dimension, much less likely to represent abscess. Electronically Signed   By: Franki Cabot M.D.   On: 06/05/2019 14:14       Assessment & Plan:   Problem List Items Addressed This Visit     Essential (primary) hypertension    Continue amlodipine and losartan.  Blood pressure dong well.  Follow pressures.  Follow metabolic panel.       Relevant Orders   Ambulatory referral to Pulmonology   GERD (gastroesophageal reflux disease)    Saw GI - planning for EGD. Recommended protonix and pepcid.       H/O syncope    Has f/u cardiology 09/06/21 - syncope - etiology unclear s/p holter, echo and stress test. Has not had any further episodes.       Hyponatremia    Recheck sodium.        Relevant Orders   Sodium (Completed)   Iron deficiency    With history of iron deficient anemia and with persistent reflux despite PPI last visit,  Was referred back to GI for further evaluation.  Note reviewed - EGD.       Pure hypercholesterolemia    On crestor.  Low cholesterol diet and exercise.  Follow lipid panel and liver function tests.        Snoring    With snoring, dry mouth, hypertension and nocturia, discussed possible sleep apnea.  Discussed referral to pulmonary for evaluation.       Relevant Orders   Ambulatory referral to Pulmonology   Stress    Overall improved.  Retired.  Follow.       Other Visit Diagnoses     Need for immunization against influenza       Relevant Orders   Flu Vaccine QUAD High Dose(Fluad) (Completed)        Einar Pheasant, MD

## 2021-11-18 ENCOUNTER — Other Ambulatory Visit: Payer: Self-pay

## 2021-11-18 DIAGNOSIS — E871 Hypo-osmolality and hyponatremia: Secondary | ICD-10-CM

## 2021-11-21 ENCOUNTER — Encounter: Payer: Self-pay | Admitting: Internal Medicine

## 2021-11-21 DIAGNOSIS — R0683 Snoring: Secondary | ICD-10-CM | POA: Insufficient documentation

## 2021-11-21 NOTE — Assessment & Plan Note (Signed)
With history of iron deficient anemia and with persistent reflux despite PPI last visit,  Was referred back to GI for further evaluation.  Note reviewed - EGD.

## 2021-11-21 NOTE — Assessment & Plan Note (Signed)
Continue amlodipine and losartan.  Blood pressure dong well.  Follow pressures.  Follow metabolic panel.  

## 2021-11-21 NOTE — Assessment & Plan Note (Signed)
With snoring, dry mouth, hypertension and nocturia, discussed possible sleep apnea.  Discussed referral to pulmonary for evaluation.

## 2021-11-21 NOTE — Assessment & Plan Note (Signed)
Recheck sodium.   

## 2021-11-21 NOTE — Assessment & Plan Note (Signed)
Has f/u cardiology 09/06/21 - syncope - etiology unclear s/p holter, echo and stress test. Has not had any further episodes.

## 2021-11-21 NOTE — Assessment & Plan Note (Signed)
On crestor.  Low cholesterol diet and exercise.  Follow lipid panel and liver function tests.   

## 2021-11-21 NOTE — Assessment & Plan Note (Signed)
Overall improved.  Retired.  Follow.  

## 2021-11-21 NOTE — Assessment & Plan Note (Signed)
Saw GI - planning for EGD. Recommended protonix and pepcid.

## 2021-12-02 ENCOUNTER — Encounter: Payer: Self-pay | Admitting: Adult Health

## 2021-12-02 ENCOUNTER — Ambulatory Visit (INDEPENDENT_AMBULATORY_CARE_PROVIDER_SITE_OTHER): Payer: Medicare PPO | Admitting: Adult Health

## 2021-12-02 VITALS — BP 120/70 | HR 80 | Temp 97.9°F | Ht 70.0 in | Wt 148.8 lb

## 2021-12-02 DIAGNOSIS — G4700 Insomnia, unspecified: Secondary | ICD-10-CM

## 2021-12-02 DIAGNOSIS — R0683 Snoring: Secondary | ICD-10-CM

## 2021-12-02 DIAGNOSIS — G4719 Other hypersomnia: Secondary | ICD-10-CM | POA: Diagnosis not present

## 2021-12-02 NOTE — Patient Instructions (Signed)
Set up for home sleep study  Work on healthy sleep regimen  Do not drive if sleepy  Follow up in 6 weeks to discuss results and treatment plan

## 2021-12-02 NOTE — Progress Notes (Signed)
_0  ID: Margie Billet, male    DOB: December 18, 1950, 71 y.o.   MRN: [id number]  Chief Complaint  Patient presents with   sleep consult    Referring provider: Einar Pheasant, MD  HPI: 71 year old male seen for sleep consult December 02, 2021 for snoring, daytime sleepiness, fatigue and restless sleep  TEST/EVENTS :   12/02/2021 Sleep consult  Patient presents for sleep consult today for snoring, daytime sleepiness restless sleep and fatigue.  Kindly referred by primary care provider Dr. Nicki Reaper.  Patient complains he has had ongoing snoring, restless sleep, wakes up tired and unrefreshed.  Feels that he is grumpy throughout the day because he has not slept well.  Feels always tired and unrefreshed.  Patient has been taking Benadryl to help with sleep.  Says he wakes up multiple times throughout the night.  Feels like he cannot turn his brain off.  He does read on his phone or tablet.  He does turn into the dim lit mode.  Has no history of congestive heart failure or stroke.  No removable dental work.  Says he feels like he can take a nap but he does not because he feels worse after he takes a nap.  Does have intermittent headaches.  He does typically drink 2 sodas and 1 tea daily.  No symptoms suspicious for cataplexy or sleep paralysis.  Typically goes to bed about 10 and p.m. to 12 midnight.  Takes up to an hour to go to sleep.  Is up several times throughout the night.  Gets up about 6 AM.  Weight is up about 5 pounds over the last 2 years current weight is at 148 with a BMI at 21.  Has had no previous sleep study. Epworth score is 3 out of 24.  Typically gets sleepy if he sits down to watch TV or read.  And in the evening hours and after eating.  Social history patient is married.  Lives at home with his wife.  His 2 grandchildren live there most of the time.  He is a never smoker.  Drinks 2-3 beers a day.  Does not use drugs.  He is retired Chief Financial Officer.  Patient states he is active.  Family  history unknown.  Past Surgical History:  Procedure Laterality Date   WISDOM TOOTH EXTRACTION        No Known Allergies  Immunization History  Administered Date(s) Administered   Fluad Quad(high Dose 65+) 10/18/2018, 10/29/2019, 11/16/2021   Influenza Split 02/09/2012, 12/10/2012   Influenza, High Dose Seasonal PF 01/03/2017   Influenza,inj,Quad PF,6+ Mos 01/07/2015   Influenza-Unspecified 11/27/2017   MMR 12/07/2017   PFIZER(Purple Top)SARS-COV-2 Vaccination 03/18/2019, 04/08/2019, 01/14/2020   Pneumococcal Conjugate-13 11/30/2018   Tdap 05/18/2019   Zoster Recombinat (Shingrix) 12/15/2018    Past Medical History:  Diagnosis Date   Hypercholesterolemia    Hypertension    Leukopenia    benign cyclical    Tobacco History: Social History   Tobacco Use  Smoking Status Never  Smokeless Tobacco Never   Counseling given: Not Answered   Outpatient Medications Prior to Visit  Medication Sig Dispense Refill   acetaminophen (TYLENOL) 500 MG tablet Take 500 mg by mouth every 6 (six) hours as needed. 2-3 daily     amLODipine (NORVASC) 2.5 MG tablet TAKE 1 TABLET(2.5 MG) BY MOUTH DAILY 90 tablet 1   aspirin EC 81 MG tablet Take 81 mg by mouth daily.     famotidine (PEPCID) 20 MG tablet Take 20 mg  by mouth daily.     losartan (COZAAR) 100 MG tablet TAKE 1 TABLET BY MOUTH DAILY 90 tablet 2   pantoprazole (PROTONIX) 40 MG tablet TAKE 1 TABLET BY MOUTH EVERY DAY 90 tablet 3   rosuvastatin (CRESTOR) 10 MG tablet TAKE 1 TABLET BY MOUTH DAILY 90 tablet 1   No facility-administered medications prior to visit.     Review of Systems:   Constitutional:   No  weight loss, night sweats,  Fevers, chills, +fatigue, or  lassitude.  HEENT:   No headaches,  Difficulty swallowing,  Tooth/dental problems, or  Sore throat,                No sneezing, itching, ear ache, nasal congestion, post nasal drip,   CV:  No chest pain,  Orthopnea, PND, swelling in lower extremities, anasarca,  dizziness, palpitations, syncope.   GI  No heartburn, indigestion, abdominal pain, nausea, vomiting, diarrhea, change in bowel habits, loss of appetite, bloody stools.   Resp: No shortness of breath with exertion or at rest.  No excess mucus, no productive cough,  No non-productive cough,  No coughing up of blood.  No change in color of mucus.  No wheezing.  No chest wall deformity  Skin: no rash or lesions.  GU: no dysuria, change in color of urine, no urgency or frequency.  No flank pain, no hematuria   MS:  No joint pain or swelling.  No decreased range of motion.  No back pain.    Physical Exam  BP 120/70 (BP Location: Left Arm, Cuff Size: Normal)   Pulse 80   Temp 97.9 F (36.6 C) (Temporal)   Ht _0  (1.778 m)   Wt 148 lb 12.8 oz (67.5 kg)   SpO2 98%   BMI 21.35 kg/m   GEN: A/Ox3; pleasant , NAD, well nourished    HEENT:  [place]/AT,   NOSE-clear, THROAT-clear, no lesions, no postnasal drip or exudate noted.  Class II-III MP airway.  NECK:  Supple w/ fair ROM; no JVD; normal carotid impulses w/o bruits; no thyromegaly or nodules palpated; no lymphadenopathy.    RESP  Clear  P & A; w/o, wheezes/ rales/ or rhonchi. no accessory muscle use, no dullness to percussion  CARD:  RRR, no m/r/g, no peripheral edema, pulses intact, no cyanosis or clubbing.  GI:   Soft & nt; nml bowel sounds; no organomegaly or masses detected.   Musco: Warm bil, no deformities or joint swelling noted.   Neuro: alert, no focal deficits noted.    Skin: Warm, no lesions or rashes    Lab Results:  CBC  BNP No results found for: "BNP"  ProBNP No results found for: "PROBNP"  Imaging: No results found.        No data to display          No results found for: "NITRICOXIDE"      Assessment & Plan:   Snoring Snoring, restless sleep, daytime fatigue and daytime sleepiness all concerning for underlying sleep apnea.  We will set patient up for home sleep study.  Patient  education was given  - discussed how weight can impact sleep and risk for sleep disordered breathing - discussed options to assist with weight loss: combination of diet modification, cardiovascular and strength training exercises   - had an extensive discussion regarding the adverse health consequences related to untreated sleep disordered breathing - specifically discussed the risks for hypertension, coronary artery disease, cardiac dysrhythmias, cerebrovascular disease, and diabetes -  lifestyle modification discussed   - discussed how sleep disruption can increase risk of accidents, particularly when driving - safe driving practices were discussed   Plan  Patient Instructions  Set up for home sleep study  Work on healthy sleep regimen  Do not drive if sleepy  Follow up in 6 weeks to discuss results and treatment plan     Insomnia We discussed healthy sleep regimen.  Helpful hints given.  Plan  Patient Instructions  Set up for home sleep study  Work on healthy sleep regimen  Do not drive if sleepy  Follow up in 6 weeks to discuss results and treatment plan       Rexene Edison, NP 12/02/2021

## 2021-12-02 NOTE — Assessment & Plan Note (Signed)
We discussed healthy sleep regimen.  Helpful hints given.  Plan  Patient Instructions  Set up for home sleep study  Work on healthy sleep regimen  Do not drive if sleepy  Follow up in 6 weeks to discuss results and treatment plan

## 2021-12-02 NOTE — Assessment & Plan Note (Signed)
Snoring, restless sleep, daytime fatigue and daytime sleepiness all concerning for underlying sleep apnea.  We will set patient up for home sleep study.  Patient education was given  - discussed how weight can impact sleep and risk for sleep disordered breathing - discussed options to assist with weight loss: combination of diet modification, cardiovascular and strength training exercises   - had an extensive discussion regarding the adverse health consequences related to untreated sleep disordered breathing - specifically discussed the risks for hypertension, coronary artery disease, cardiac dysrhythmias, cerebrovascular disease, and diabetes - lifestyle modification discussed   - discussed how sleep disruption can increase risk of accidents, particularly when driving - safe driving practices were discussed   Plan  Patient Instructions  Set up for home sleep study  Work on healthy sleep regimen  Do not drive if sleepy  Follow up in 6 weeks to discuss results and treatment plan

## 2021-12-03 NOTE — Progress Notes (Signed)
Reviewed and agree with assessment/plan.   Numa Heatwole, MD [facility] 12/03/2021, 7:45 AM Pager:  [phone]  

## 2021-12-06 ENCOUNTER — Other Ambulatory Visit: Payer: Self-pay | Admitting: Internal Medicine

## 2021-12-09 DIAGNOSIS — D509 Iron deficiency anemia, unspecified: Secondary | ICD-10-CM | POA: Diagnosis not present

## 2021-12-09 DIAGNOSIS — K317 Polyp of stomach and duodenum: Secondary | ICD-10-CM | POA: Diagnosis not present

## 2021-12-09 DIAGNOSIS — K219 Gastro-esophageal reflux disease without esophagitis: Secondary | ICD-10-CM | POA: Diagnosis not present

## 2021-12-09 DIAGNOSIS — Z87898 Personal history of other specified conditions: Secondary | ICD-10-CM | POA: Diagnosis not present

## 2021-12-15 DIAGNOSIS — D2262 Melanocytic nevi of left upper limb, including shoulder: Secondary | ICD-10-CM | POA: Diagnosis not present

## 2021-12-15 DIAGNOSIS — D225 Melanocytic nevi of trunk: Secondary | ICD-10-CM | POA: Diagnosis not present

## 2021-12-15 DIAGNOSIS — D2272 Melanocytic nevi of left lower limb, including hip: Secondary | ICD-10-CM | POA: Diagnosis not present

## 2021-12-15 DIAGNOSIS — D2261 Melanocytic nevi of right upper limb, including shoulder: Secondary | ICD-10-CM | POA: Diagnosis not present

## 2021-12-15 DIAGNOSIS — L821 Other seborrheic keratosis: Secondary | ICD-10-CM | POA: Diagnosis not present

## 2021-12-15 DIAGNOSIS — D2271 Melanocytic nevi of right lower limb, including hip: Secondary | ICD-10-CM | POA: Diagnosis not present

## 2022-01-05 ENCOUNTER — Other Ambulatory Visit (INDEPENDENT_AMBULATORY_CARE_PROVIDER_SITE_OTHER): Payer: Medicare PPO

## 2022-01-05 DIAGNOSIS — E871 Hypo-osmolality and hyponatremia: Secondary | ICD-10-CM

## 2022-01-05 LAB — SODIUM: Sodium: 134 mEq/L — ABNORMAL LOW (ref 135–145)

## 2022-01-13 ENCOUNTER — Ambulatory Visit: Payer: Medicare PPO

## 2022-01-13 DIAGNOSIS — G4719 Other hypersomnia: Secondary | ICD-10-CM

## 2022-01-13 DIAGNOSIS — G4733 Obstructive sleep apnea (adult) (pediatric): Secondary | ICD-10-CM

## 2022-01-14 DIAGNOSIS — G4733 Obstructive sleep apnea (adult) (pediatric): Secondary | ICD-10-CM | POA: Diagnosis not present

## 2022-01-20 ENCOUNTER — Telehealth: Payer: Self-pay | Admitting: Adult Health

## 2022-01-20 ENCOUNTER — Encounter: Payer: Self-pay | Admitting: Adult Health

## 2022-01-20 ENCOUNTER — Ambulatory Visit: Payer: Medicare PPO | Admitting: Adult Health

## 2022-01-20 VITALS — BP 130/72 | HR 79 | Temp 98.0°F | Ht 72.0 in | Wt 149.6 lb

## 2022-01-20 DIAGNOSIS — G4733 Obstructive sleep apnea (adult) (pediatric): Secondary | ICD-10-CM

## 2022-01-20 DIAGNOSIS — G4719 Other hypersomnia: Secondary | ICD-10-CM | POA: Diagnosis not present

## 2022-01-20 NOTE — Telephone Encounter (Signed)
Patient is aware of results and voiced his understanding.  In office appt scheduled 01/20/2022 at 3:30. Nothing further needed.

## 2022-01-20 NOTE — Patient Instructions (Signed)
Referral to orthodontics , Dr. Myrtis Ser for oral appliance for sleep apnea. [phone]) Work on healthy sleep regimen  Do not drive if sleepy  Follow up in 6 months and As needed

## 2022-01-20 NOTE — Telephone Encounter (Signed)
Home sleep study January 13, 2022 showed mild sleep apnea AHI 7.3/hour and SpO2 low at 86%  Please set up an office visit or virtual visit to discuss sleep study results and treatment plan

## 2022-01-20 NOTE — Assessment & Plan Note (Addendum)
Mild obstructive sleep apnea,  education given on sleep apnea.  Treatment options reviewed in detail.  Patient like referral for an oral appliance.  - discussed how weight can impact sleep and risk for sleep disordered breathing - discussed options to assist with weight loss: combination of diet modification, cardiovascular and strength training exercises   - had an extensive discussion regarding the adverse health consequences related to untreated sleep disordered breathing - specifically discussed the risks for hypertension, coronary artery disease, cardiac dysrhythmias, cerebrovascular disease, and diabetes - lifestyle modification discussed   - discussed how sleep disruption can increase risk of accidents, particularly when driving - safe driving practices were discussed   Plan  Patient Instructions  Referral to orthodontics , Dr. Myrtis Ser for oral appliance for sleep apnea. [phone]) Work on healthy sleep regimen  Do not drive if sleepy  Follow up in 6 months and As needed

## 2022-01-20 NOTE — Progress Notes (Signed)
_0  ID: Scott Cohen, male    DOB: Apr 15, 1950, 71 y.o.   MRN: [id number]  Chief Complaint  Patient presents with   Follow-up    Referring provider: Einar Pheasant, MD  HPI: 71 year old male seen for sleep consult December 02, 2021 for snoring, daytime sleepiness and restless sleep found to have mild sleep apnea  TEST/EVENTS :  Home sleep study January 13, 2022 showed mild sleep apnea AHI 7.3/hour and SpO2 low at 86%   01/20/2022 Follow up : OSA  Patient presents for a 6-week follow-up.  Patient was seen last visit for sleep consult.  Had snoring, daytime sleepiness and restless sleep.  Patient was set up for home sleep study that was done January 13, 2022 that showed mild sleep apnea with AHI 7.3/hour and SpO2 low at 86%.  We reviewed his sleep study results in detail went over treatment options including positional sleep , oral appliance and/or CPAP . He would like referral for oral appliance .   No Known Allergies  Immunization History  Administered Date(s) Administered   Fluad Quad(high Dose 65+) 10/18/2018, 10/29/2019, 11/16/2021   Influenza Split 02/09/2012, 12/10/2012   Influenza, High Dose Seasonal PF 01/03/2017   Influenza,inj,Quad PF,6+ Mos 01/07/2015   Influenza-Unspecified 11/27/2017   MMR 12/07/2017   PFIZER(Purple Top)SARS-COV-2 Vaccination 03/18/2019, 04/08/2019, 01/14/2020   Pneumococcal Conjugate-13 11/30/2018   Tdap 05/18/2019   Zoster Recombinat (Shingrix) 12/15/2018    Past Medical History:  Diagnosis Date   Hypercholesterolemia    Hypertension    Leukopenia    benign cyclical    Tobacco History: Social History   Tobacco Use  Smoking Status Never  Smokeless Tobacco Never   Counseling given: Not Answered   Outpatient Medications Prior to Visit  Medication Sig Dispense Refill   acetaminophen (TYLENOL) 500 MG tablet Take 500 mg by mouth every 6 (six) hours as needed. 2-3 daily     amLODipine (NORVASC) 2.5 MG tablet TAKE 1 TABLET(2.5  MG) BY MOUTH DAILY 90 tablet 1   aspirin EC 81 MG tablet Take 81 mg by mouth daily.     famotidine (PEPCID) 20 MG tablet Take 20 mg by mouth daily.     losartan (COZAAR) 100 MG tablet TAKE 1 TABLET BY MOUTH DAILY 90 tablet 2   pantoprazole (PROTONIX) 40 MG tablet TAKE 1 TABLET BY MOUTH EVERY DAY 90 tablet 3   rosuvastatin (CRESTOR) 10 MG tablet TAKE 1 TABLET BY MOUTH DAILY 90 tablet 1   No facility-administered medications prior to visit.     Review of Systems:   Constitutional:   No  weight loss, night sweats,  Fevers, chills, +fatigue, or  lassitude.  HEENT:   No headaches,  Difficulty swallowing,  Tooth/dental problems, or  Sore throat,                No sneezing, itching, ear ache, nasal congestion, post nasal drip,   CV:  No chest pain,  Orthopnea, PND, swelling in lower extremities, anasarca, dizziness, palpitations, syncope.   GI  No heartburn, indigestion, abdominal pain, nausea, vomiting, diarrhea, change in bowel habits, loss of appetite, bloody stools.   Resp: No shortness of breath with exertion or at rest.  No excess mucus, no productive cough,  No non-productive cough,  No coughing up of blood.  No change in color of mucus.  No wheezing.  No chest wall deformity  Skin: no rash or lesions.  GU: no dysuria, change in color of urine, no urgency or frequency.  No flank pain, no hematuria   MS:  No joint pain or swelling.  No decreased range of motion.  No back pain.    Physical Exam  BP 130/72 (BP Location: Left Arm, Cuff Size: Normal)   Pulse 79   Temp 98 F (36.7 C) (Temporal)   Ht 6' (1.829 m)   Wt 149 lb 9.6 oz (67.9 kg)   SpO2 98%   BMI 20.29 kg/m   GEN: A/Ox3; pleasant , NAD, well nourished    HEENT:  [place]/AT,  NOSE-clear, THROAT-clear, no lesions, no postnasal drip or exudate noted.   NECK:  Supple w/ fair ROM; no JVD; normal carotid impulses w/o bruits; no thyromegaly or nodules palpated; no lymphadenopathy.    RESP  Clear  P & A; w/o, wheezes/ rales/  or rhonchi. no accessory muscle use, no dullness to percussion  CARD:  RRR, no m/r/g, no peripheral edema, pulses intact, no cyanosis or clubbing.  GI:   Soft & nt; nml bowel sounds; no organomegaly or masses detected.   Musco: Warm bil, no deformities or joint swelling noted.   Neuro: alert, no focal deficits noted.    Skin: Warm, no lesions or rashes    Lab Results:  CBC    Component Value Date/Time   WBC 4.1 11/12/2021 0901   RBC 4.32 11/12/2021 0901   HGB 13.6 11/12/2021 0901   HCT 39.6 11/12/2021 0901   PLT 240.0 11/12/2021 0901   MCV 91.8 11/12/2021 0901   MCH 30.2 07/09/2015 1147   MCHC 34.2 11/12/2021 0901   RDW 12.6 11/12/2021 0901   LYMPHSABS 1.3 11/12/2021 0901   MONOABS 0.4 11/12/2021 0901   EOSABS 0.0 11/12/2021 0901   BASOSABS 0.0 11/12/2021 0901    BMET    Component Value Date/Time   NA 134 (L) 01/05/2022 0805   K 4.5 11/12/2021 0901   CL 96 11/12/2021 0901   CO2 27 11/12/2021 0901   GLUCOSE 90 11/12/2021 0901   BUN 11 11/12/2021 0901   CREATININE 0.83 11/12/2021 0901   CALCIUM 9.4 11/12/2021 0901   GFRNONAA >60 07/09/2015 1147   GFRAA >60 07/09/2015 1147    BNP No results found for: "BNP"  ProBNP No results found for: "PROBNP"  Imaging: No results found.        No data to display          No results found for: "NITRICOXIDE"      Assessment & Plan:   OSA (obstructive sleep apnea) Mild obstructive sleep apnea,  education given on sleep apnea.  Treatment options reviewed in detail.  Patient like referral for an oral appliance.  - discussed how weight can impact sleep and risk for sleep disordered breathing - discussed options to assist with weight loss: combination of diet modification, cardiovascular and strength training exercises   - had an extensive discussion regarding the adverse health consequences related to untreated sleep disordered breathing - specifically discussed the risks for hypertension, coronary artery  disease, cardiac dysrhythmias, cerebrovascular disease, and diabetes - lifestyle modification discussed   - discussed how sleep disruption can increase risk of accidents, particularly when driving - safe driving practices were discussed   Plan  Patient Instructions  Referral to orthodontics , Dr. Ron Parker for oral appliance for sleep apnea. [phone]) Work on healthy sleep regimen  Do not drive if sleepy  Follow up in 6 months and As needed        Rexene Edison, NP 01/20/2022

## 2022-01-23 NOTE — Progress Notes (Signed)
Reviewed and agree with assessment/plan.   Lanorris Kalisz, MD [facility] 01/23/2022, 7:20 PM Pager:  [phone]  

## 2022-02-23 ENCOUNTER — Encounter: Payer: Self-pay | Admitting: Internal Medicine

## 2022-02-23 DIAGNOSIS — E7800 Pure hypercholesterolemia, unspecified: Secondary | ICD-10-CM

## 2022-02-23 DIAGNOSIS — E559 Vitamin D deficiency, unspecified: Secondary | ICD-10-CM

## 2022-02-23 DIAGNOSIS — E871 Hypo-osmolality and hyponatremia: Secondary | ICD-10-CM

## 2022-02-23 NOTE — Telephone Encounter (Signed)
I have placed the order for the labs - fasting and vitamin D

## 2022-03-10 DIAGNOSIS — I10 Essential (primary) hypertension: Secondary | ICD-10-CM | POA: Diagnosis not present

## 2022-03-10 DIAGNOSIS — R0789 Other chest pain: Secondary | ICD-10-CM | POA: Diagnosis not present

## 2022-03-10 DIAGNOSIS — E7800 Pure hypercholesterolemia, unspecified: Secondary | ICD-10-CM | POA: Diagnosis not present

## 2022-03-10 DIAGNOSIS — K219 Gastro-esophageal reflux disease without esophagitis: Secondary | ICD-10-CM | POA: Diagnosis not present

## 2022-03-10 DIAGNOSIS — R42 Dizziness and giddiness: Secondary | ICD-10-CM | POA: Diagnosis not present

## 2022-03-11 ENCOUNTER — Other Ambulatory Visit: Payer: Self-pay | Admitting: Family

## 2022-03-15 ENCOUNTER — Other Ambulatory Visit: Payer: Self-pay

## 2022-03-15 ENCOUNTER — Telehealth: Payer: Self-pay | Admitting: Internal Medicine

## 2022-03-15 MED ORDER — AMLODIPINE BESYLATE 2.5 MG PO TABS: ORAL_TABLET | ORAL | 1 refills | Status: DC

## 2022-03-15 NOTE — Telephone Encounter (Signed)
Prescription Request  03/15/2022  Is this a "Controlled Substance" medicine? No  LOV: 11/16/2021  What is the name of the medication or equipment? amLODipine (NORVASC) 2.5 MG tablet  Have you contacted your pharmacy to request a refill? Yes   Which pharmacy would you like this sent to?   [facility] [place], [place] [address] AT [place] [address] [place] [address] Phone: [phone] Fax: [phone]    Patient notified that their request is being sent to the clinical staff for review and that they should receive a response within 2 business days.   Please advise at Mobile [phone] (mobile)

## 2022-03-15 NOTE — Telephone Encounter (Signed)
sent

## 2022-03-18 ENCOUNTER — Other Ambulatory Visit (INDEPENDENT_AMBULATORY_CARE_PROVIDER_SITE_OTHER): Payer: Medicare PPO

## 2022-03-18 DIAGNOSIS — E871 Hypo-osmolality and hyponatremia: Secondary | ICD-10-CM

## 2022-03-18 DIAGNOSIS — E7800 Pure hypercholesterolemia, unspecified: Secondary | ICD-10-CM

## 2022-03-18 DIAGNOSIS — E559 Vitamin D deficiency, unspecified: Secondary | ICD-10-CM

## 2022-03-18 LAB — HEPATIC FUNCTION PANEL
ALT: 74 U/L — ABNORMAL HIGH (ref 0–53)
AST: 74 U/L — ABNORMAL HIGH (ref 0–37)
Albumin: 4.2 g/dL (ref 3.5–5.2)
Alkaline Phosphatase: 104 U/L (ref 39–117)
Bilirubin, Direct: 0.1 mg/dL (ref 0.0–0.3)
Total Bilirubin: 0.4 mg/dL (ref 0.2–1.2)
Total Protein: 7 g/dL (ref 6.0–8.3)

## 2022-03-18 LAB — LIPID PANEL
Cholesterol: 168 mg/dL (ref 0–200)
HDL: 87.4 mg/dL (ref >39.00)
LDL Cholesterol: 61 mg/dL (ref 0–99)
NonHDL: 80.13
Total CHOL/HDL Ratio: 2
Triglycerides: 95 mg/dL (ref 0.0–149.0)
VLDL: 19 mg/dL (ref 0.0–40.0)

## 2022-03-18 LAB — BASIC METABOLIC PANEL
BUN: 11 mg/dL (ref 6–23)
CO2: 27 mEq/L (ref 19–32)
Calcium: 9.2 mg/dL (ref 8.4–10.5)
Chloride: 99 mEq/L (ref 96–112)
Creatinine, Ser: 0.86 mg/dL (ref 0.40–1.50)
GFR: 87.11 mL/min (ref >60.00)
Glucose, Bld: 88 mg/dL (ref 70–99)
Potassium: 4.7 mEq/L (ref 3.5–5.1)
Sodium: 134 mEq/L — ABNORMAL LOW (ref 135–145)

## 2022-03-18 LAB — VITAMIN D 25 HYDROXY (VIT D DEFICIENCY, FRACTURES): VITD: 27.28 ng/mL — ABNORMAL LOW (ref 30.00–100.00)

## 2022-03-18 LAB — TSH: TSH: 1.22 u[IU]/mL (ref 0.35–5.50)

## 2022-03-22 ENCOUNTER — Encounter: Payer: Self-pay | Admitting: Internal Medicine

## 2022-03-22 ENCOUNTER — Ambulatory Visit (INDEPENDENT_AMBULATORY_CARE_PROVIDER_SITE_OTHER): Payer: Medicare PPO | Admitting: Internal Medicine

## 2022-03-22 VITALS — BP 122/78 | HR 79 | Temp 97.9°F | Resp 16 | Ht 72.0 in | Wt 150.2 lb

## 2022-03-22 DIAGNOSIS — E7800 Pure hypercholesterolemia, unspecified: Secondary | ICD-10-CM | POA: Diagnosis not present

## 2022-03-22 DIAGNOSIS — R7989 Other specified abnormal findings of blood chemistry: Secondary | ICD-10-CM

## 2022-03-22 DIAGNOSIS — I10 Essential (primary) hypertension: Secondary | ICD-10-CM

## 2022-03-22 DIAGNOSIS — Z0000 Encounter for general adult medical examination without abnormal findings: Secondary | ICD-10-CM | POA: Diagnosis not present

## 2022-03-22 DIAGNOSIS — Z125 Encounter for screening for malignant neoplasm of prostate: Secondary | ICD-10-CM

## 2022-03-22 DIAGNOSIS — G4733 Obstructive sleep apnea (adult) (pediatric): Secondary | ICD-10-CM | POA: Diagnosis not present

## 2022-03-22 DIAGNOSIS — Z87898 Personal history of other specified conditions: Secondary | ICD-10-CM

## 2022-03-22 DIAGNOSIS — K219 Gastro-esophageal reflux disease without esophagitis: Secondary | ICD-10-CM

## 2022-03-22 DIAGNOSIS — F439 Reaction to severe stress, unspecified: Secondary | ICD-10-CM

## 2022-03-22 DIAGNOSIS — E559 Vitamin D deficiency, unspecified: Secondary | ICD-10-CM

## 2022-03-22 LAB — CBC WITH DIFFERENTIAL/PLATELET
Basophils Absolute: 0 10*3/uL (ref 0.0–0.1)
Basophils Relative: 0.7 % (ref 0.0–3.0)
Eosinophils Absolute: 0 10*3/uL (ref 0.0–0.7)
Eosinophils Relative: 0.7 % (ref 0.0–5.0)
HCT: 41.4 % (ref 39.0–52.0)
Hemoglobin: 13.9 g/dL (ref 13.0–17.0)
Lymphocytes Relative: 29.3 % (ref 12.0–46.0)
Lymphs Abs: 1.6 10*3/uL (ref 0.7–4.0)
MCHC: 33.7 g/dL (ref 30.0–36.0)
MCV: 91.7 fl (ref 78.0–100.0)
Monocytes Absolute: 0.5 10*3/uL (ref 0.1–1.0)
Monocytes Relative: 9.5 % (ref 3.0–12.0)
Neutro Abs: 3.3 10*3/uL (ref 1.4–7.7)
Neutrophils Relative %: 59.8 % (ref 43.0–77.0)
Platelets: 283 10*3/uL (ref 150.0–400.0)
RBC: 4.52 Mil/uL (ref 4.22–5.81)
RDW: 13 % (ref 11.5–15.5)
WBC: 5.5 10*3/uL (ref 4.0–10.5)

## 2022-03-22 LAB — HEPATIC FUNCTION PANEL
ALT: 34 U/L (ref 0–53)
AST: 27 U/L (ref 0–37)
Albumin: 4.3 g/dL (ref 3.5–5.2)
Alkaline Phosphatase: 89 U/L (ref 39–117)
Bilirubin, Direct: 0.2 mg/dL (ref 0.0–0.3)
Total Bilirubin: 0.5 mg/dL (ref 0.2–1.2)
Total Protein: 6.9 g/dL (ref 6.0–8.3)

## 2022-03-22 LAB — FERRITIN: Ferritin: 84.1 ng/mL (ref 22.0–322.0)

## 2022-03-22 LAB — PSA, MEDICARE: PSA: 2.46 ng/ml (ref 0.10–4.00)

## 2022-03-22 MED ORDER — ROSUVASTATIN CALCIUM 10 MG PO TABS: 10.0000 mg | ORAL_TABLET | Freq: Every day | ORAL | 1 refills | Status: DC

## 2022-03-22 MED ORDER — LOSARTAN POTASSIUM 100 MG PO TABS: 100.0000 mg | ORAL_TABLET | Freq: Every day | ORAL | 2 refills | Status: DC

## 2022-03-22 NOTE — Patient Instructions (Signed)
Increase vitamin D3 to 2000 units per day

## 2022-03-22 NOTE — Progress Notes (Signed)
Subjective:    Patient ID: Scott Cohen, male    DOB: [birth year], 72 y.o.   MRN: [id number]  Patient here for  Chief Complaint  Patient presents with   Annual Exam    HPI Here for physical exam. Saw cardiology (Dr Clayborn Bigness) 03/10/22. Evaluated - light headedness/near syncope.  Recent non invasive testing - unremarkable.  Referred to neurology.  Has previously seen neurology - last 2019 - for above.  Carotid ultrasound ordered.   Seeing pulmonary - [facility] 01/13/22 - mild sleep apnea.  Referred for oral appliance - Dr Ron Parker.  Saw GI 11/15/21 - recommended EGD - screening for Barretts - given long standing history of GERD and daily EtOH. Discussed above.  Has appt with Dr Manuella Ghazi 05/2022.  No further episodes since seeing cardiology.  No chest pain or sob reported.  Does report that weekend before last was sick.  Had diarrhea and decreased appetite.  Some fatigue.  Appetite back to normal now.  No diarrhea.  No current symptoms.  Drinks 3-4 beers per night.  Discussed labs and elevated LFTs  no abdominal pain.     Past Medical History:  Diagnosis Date   Hypercholesterolemia    Hypertension    Leukopenia    benign cyclical   Past Surgical History:  Procedure Laterality Date   WISDOM TOOTH EXTRACTION     Family History  Problem Relation Age of Onset   Heart disease Mother        pacemaker   Diabetes Mother    Heart disease Father        myocardial infarction   Hypertension Father    Diabetes Father    CVA Father    Colon cancer Neg Hx    Prostate cancer Neg Hx    Social History   Socioeconomic History   Marital status: Married    Spouse name: Not on file   Number of children: Not on file   Years of education: Not on file   Highest education level: Not on file  Occupational History    Employer: [employer]  Tobacco Use   Smoking status: Never   Smokeless tobacco: Never  Vaping Use   Vaping Use: Never used  Substance and Sexual Activity   Alcohol use: Yes     Alcohol/week: 0.0 standard drinks of alcohol    Comment: 2-3 beers daily   Drug use: No   Sexual activity: Not on file  Other Topics Concern   Not on file  Social History Narrative   Not on file   Social Determinants of Health   Financial Resource Strain: Low Risk  (05/19/2021)   Overall Financial Resource Strain (CARDIA)    Difficulty of Paying Living Expenses: Not hard at all  Food Insecurity: No Food Insecurity (05/19/2021)   Hunger Vital Sign    Worried About Running Out of Food in the Last Year: Never true    Ran Out of Food in the Last Year: Never true  Transportation Needs: No Transportation Needs (05/19/2021)   PRAPARE - Hydrologist (Medical): No    Lack of Transportation (Non-Medical): No  Physical Activity: Not on file  Stress: No Stress Concern Present (05/19/2021)   [place]    Feeling of Stress : Not at all  Social Connections: Unknown (05/19/2021)   Social Connection and Isolation Panel [NHANES]    Frequency of Communication with Friends and Family: Not on file  Frequency of Social Gatherings with Friends and Family: Not on file    Attends Religious Services: Not on file    Active Member of Clubs or Organizations: Not on file    Attends Archivist Meetings: Not on file    Marital Status: Married     Review of Systems  Constitutional:  Negative for unexpected weight change.       No appetite change now.   HENT:  Negative for congestion, sinus pressure and sore throat.   Eyes:  Negative for pain and visual disturbance.  Respiratory:  Negative for cough, chest tightness and shortness of breath.   Cardiovascular:  Negative for chest pain and palpitations.  Gastrointestinal:  Negative for abdominal pain, diarrhea, nausea and vomiting.  Genitourinary:  Negative for difficulty urinating and dysuria.  Musculoskeletal:  Negative for joint swelling and myalgias.   Skin:  Negative for color change and rash.  Neurological:  Negative for dizziness and headaches.  Hematological:  Negative for adenopathy. Does not bruise/bleed easily.  Psychiatric/Behavioral:  Negative for agitation and dysphoric mood.        Objective:     BP 122/78   Pulse 79   Temp 97.9 F (36.6 C)   Resp 16   Ht 6' (1.829 m)   Wt 150 lb 3.2 oz (68.1 kg)   SpO2 98%   BMI 20.37 kg/m  Wt Readings from Last 3 Encounters:  03/22/22 150 lb 3.2 oz (68.1 kg)  01/20/22 149 lb 9.6 oz (67.9 kg)  12/02/21 148 lb 12.8 oz (67.5 kg)    Physical Exam Constitutional:      General: He is not in acute distress.    Appearance: Normal appearance. He is well-developed.  HENT:     Head: Normocephalic and atraumatic.     Right Ear: External ear normal.     Left Ear: External ear normal.  Eyes:     General: No scleral icterus.       Right eye: No discharge.        Left eye: No discharge.     Conjunctiva/sclera: Conjunctivae normal.  Neck:     Thyroid: No thyromegaly.  Cardiovascular:     Rate and Rhythm: Normal rate and regular rhythm.  Pulmonary:     Effort: No respiratory distress.     Breath sounds: Normal breath sounds. No wheezing.  Abdominal:     General: Bowel sounds are normal.     Palpations: Abdomen is soft.     Tenderness: There is no abdominal tenderness.  Musculoskeletal:        General: No swelling or tenderness.     Cervical back: Neck supple. No tenderness.  Lymphadenopathy:     Cervical: No cervical adenopathy.  Skin:    Findings: No erythema or rash.  Neurological:     Mental Status: He is alert and oriented to person, place, and time.  Psychiatric:        Mood and Affect: Mood normal.        Behavior: Behavior normal.      Outpatient Encounter Medications as of 03/22/2022  Medication Sig   acetaminophen (TYLENOL) 500 MG tablet Take 500 mg by mouth every 6 (six) hours as needed. 2-3 daily   amLODipine (NORVASC) 2.5 MG tablet TAKE 1 TABLET(2.5 MG)  BY MOUTH DAILY   aspirin EC 81 MG tablet Take 81 mg by mouth daily.   famotidine (PEPCID) 20 MG tablet Take 20 mg by mouth daily.   losartan (COZAAR) 100  MG tablet Take 1 tablet (100 mg total) by mouth daily.   pantoprazole (PROTONIX) 40 MG tablet TAKE 1 TABLET BY MOUTH EVERY DAY   rosuvastatin (CRESTOR) 10 MG tablet Take 1 tablet (10 mg total) by mouth daily.   [DISCONTINUED] losartan (COZAAR) 100 MG tablet TAKE 1 TABLET BY MOUTH DAILY   [DISCONTINUED] rosuvastatin (CRESTOR) 10 MG tablet TAKE 1 TABLET BY MOUTH DAILY   No facility-administered encounter medications on file as of 03/22/2022.     Lab Results  Component Value Date   WBC 5.5 03/22/2022   HGB 13.9 03/22/2022   HCT 41.4 03/22/2022   PLT 283.0 03/22/2022   GLUCOSE 88 03/18/2022   CHOL 168 03/18/2022   TRIG 95.0 03/18/2022   HDL 87.40 03/18/2022   LDLDIRECT 144.2 08/22/2012   LDLCALC 61 03/18/2022   ALT 34 03/22/2022   AST 27 03/22/2022   NA 134 (L) 03/18/2022   K 4.7 03/18/2022   CL 99 03/18/2022   CREATININE 0.86 03/18/2022   BUN 11 03/18/2022   CO2 27 03/18/2022   TSH 1.22 03/18/2022   PSA 2.46 03/22/2022    [place] Extrem Low Right Ltd  Result Date: 06/05/2019 CLINICAL DATA:  Bulbous lesion posterior RIGHT thigh. Status post dog bite 3 weeks ago. EXAM: ULTRASOUND RIGHT LOWER EXTREMITY LIMITED TECHNIQUE: Ultrasound examination of the lower extremity soft tissues was performed in the area of clinical concern. COMPARISON:  None. FINDINGS: Complex collection within the RIGHT posterior-lateral thigh, measuring 5.3 x 2.2 x 4.5 cm, without internal vascularity, most likely hematoma. No surround hypervascularity to suggest abscess. IMPRESSION: Probable hematoma within the soft tissues of the RIGHT posterior-lateral thigh, measuring 5.3 cm greatest dimension, much less likely to represent abscess. Electronically Signed   By: Franki Cabot M.D.   On: 06/05/2019 14:14       Assessment & Plan:  Routine general medical examination  at a health care facility  Pure hypercholesterolemia Assessment & Plan: On crestor.  Low cholesterol diet and exercise.  Follow lipid panel and liver function tests.     Healthcare maintenance Assessment & Plan: Physical today 03/22/22..  Colonoscopy 12/2017.  PSA 03/22/22 - 2.46.    Abnormal liver function tests Assessment & Plan: Noted on recent lab.  Recent GI illness as outlined.  Resolved.  3-4 beers per night.  No binge drinking.  Discussed decreasing alcohol intake.  Recheck liver panel.    Orders: -     Hepatic function panel -     CBC with Differential/Platelet -     Ferritin  Prostate cancer screening -     PSA, Medicare  Essential (primary) hypertension Assessment & Plan: Continue amlodipine and losartan.  Blood pressure dong well.  Follow pressures.  Follow metabolic panel.    Gastroesophageal reflux disease, unspecified whether esophagitis present Assessment & Plan: Saw GI - planning for EGD. Continue protonix.    H/O syncope Assessment & Plan: Has f/u cardiology 09/06/21 - syncope - etiology unclear s/p holter, echo and stress test. Reevaluated by Dr Clayborn Bigness recently.  Referred back to neurology.  Gets a warning prior to occurring.  No episodes since his visit with cardiology.    OSA (obstructive sleep apnea) Assessment & Plan: Pulmonary 12/2021 - referral to orthodontics , Dr. Ron Parker for oral appliance for sleep apnea.    Stress Assessment & Plan: Overall improved.  Retired.  Follow.    Vitamin D deficiency, unspecified Assessment & Plan: Increase vitamin D 3 to 2000 units per day.    Other  orders -     Losartan Potassium; Take 1 tablet (100 mg total) by mouth daily.  Dispense: 90 tablet; Refill: 2 -     Rosuvastatin Calcium; Take 1 tablet (10 mg total) by mouth daily.  Dispense: 90 tablet; Refill: 1     Einar Pheasant, MD

## 2022-03-22 NOTE — Assessment & Plan Note (Addendum)
Physical today 03/22/22..  Colonoscopy 12/2017.  PSA 03/22/22 - 2.46.

## 2022-03-29 ENCOUNTER — Encounter: Payer: Self-pay | Admitting: Internal Medicine

## 2022-03-29 NOTE — Assessment & Plan Note (Signed)
Continue amlodipine and losartan.  Blood pressure dong well.  Follow pressures.  Follow metabolic panel.

## 2022-03-29 NOTE — Assessment & Plan Note (Signed)
Noted on recent lab.  Recent GI illness as outlined.  Resolved.  3-4 beers per night.  No binge drinking.  Discussed decreasing alcohol intake.  Recheck liver panel.

## 2022-03-29 NOTE — Assessment & Plan Note (Signed)
Pulmonary 12/2021 - referral to orthodontics , Dr. Ron Parker for oral appliance for sleep apnea.

## 2022-03-29 NOTE — Assessment & Plan Note (Signed)
Increase vitamin D 3 to 2000 units per day.

## 2022-03-29 NOTE — Assessment & Plan Note (Signed)
Overall improved.  Retired.  Follow.

## 2022-03-29 NOTE — Assessment & Plan Note (Addendum)
Has f/u cardiology 09/06/21 - syncope - etiology unclear s/p holter, echo and stress test. Reevaluated by Dr Clayborn Bigness recently.  Referred back to neurology.  Gets a warning prior to occurring.  No episodes since his visit with cardiology.

## 2022-03-29 NOTE — Assessment & Plan Note (Signed)
Saw GI - planning for EGD. Continue protonix.

## 2022-03-29 NOTE — Assessment & Plan Note (Signed)
On crestor.  Low cholesterol diet and exercise.  Follow lipid panel and liver function tests.   

## 2022-05-09 DIAGNOSIS — H2513 Age-related nuclear cataract, bilateral: Secondary | ICD-10-CM | POA: Diagnosis not present

## 2022-05-09 DIAGNOSIS — H11123 Conjunctival concretions, bilateral: Secondary | ICD-10-CM | POA: Diagnosis not present

## 2022-05-12 DIAGNOSIS — R569 Unspecified convulsions: Secondary | ICD-10-CM | POA: Diagnosis not present

## 2022-05-17 ENCOUNTER — Other Ambulatory Visit: Payer: Self-pay | Admitting: Neurology

## 2022-05-17 ENCOUNTER — Ambulatory Visit: Payer: Medicare PPO | Admitting: Internal Medicine

## 2022-05-17 VITALS — BP 120/74 | HR 84 | Temp 98.2°F | Resp 16 | Ht 72.0 in | Wt 147.6 lb

## 2022-05-17 DIAGNOSIS — G4733 Obstructive sleep apnea (adult) (pediatric): Secondary | ICD-10-CM | POA: Diagnosis not present

## 2022-05-17 DIAGNOSIS — R55 Syncope and collapse: Secondary | ICD-10-CM

## 2022-05-17 DIAGNOSIS — I10 Essential (primary) hypertension: Secondary | ICD-10-CM

## 2022-05-17 DIAGNOSIS — K219 Gastro-esophageal reflux disease without esophagitis: Secondary | ICD-10-CM | POA: Diagnosis not present

## 2022-05-17 DIAGNOSIS — Z87898 Personal history of other specified conditions: Secondary | ICD-10-CM | POA: Diagnosis not present

## 2022-05-17 DIAGNOSIS — F439 Reaction to severe stress, unspecified: Secondary | ICD-10-CM | POA: Diagnosis not present

## 2022-05-17 DIAGNOSIS — R7989 Other specified abnormal findings of blood chemistry: Secondary | ICD-10-CM

## 2022-05-17 DIAGNOSIS — E7800 Pure hypercholesterolemia, unspecified: Secondary | ICD-10-CM

## 2022-05-17 NOTE — Progress Notes (Signed)
Subjective:    Patient ID: Scott Cohen, male    DOB: 1950/11/29, 72 y.o.   MRN: [id number]  Patient here for  Chief Complaint  Patient presents with   Medical Management of Chronic Issues    HPI Here to follow up regarding hypercholesterolemia and hypertension.  Reports he is doing relatively well. Saw Dr Sherryll Burger - 05/12/22 - for continued episodes of weakness, turning pale and losing touch with reality - without LOC.  Recommended MRI brain and sleep deprived EEG.  Discussed starting lamictal.  Last episode - 04/2022.  Has had extensive cardiac w/up.  No chest pain or sob reported.  No abdominal pain.  Eating.  Handling stress.     Past Medical History:  Diagnosis Date   Hypercholesterolemia    Hypertension    Leukopenia    benign cyclical   Past Surgical History:  Procedure Laterality Date   WISDOM TOOTH EXTRACTION     Family History  Problem Relation Age of Onset   Heart disease Mother        pacemaker   Diabetes Mother    Heart disease Father        myocardial infarction   Hypertension Father    Diabetes Father    CVA Father    Colon cancer Neg Hx    Prostate cancer Neg Hx    Social History   Socioeconomic History   Marital status: Married    Spouse name: Not on file   Number of children: Not on file   Years of education: Not on file   Highest education level: Not on file  Occupational History    Employer: [employer]  Tobacco Use   Smoking status: Never   Smokeless tobacco: Never  Vaping Use   Vaping Use: Never used  Substance and Sexual Activity   Alcohol use: Yes    Alcohol/week: 0.0 standard drinks of alcohol    Comment: 2-3 beers daily   Drug use: No   Sexual activity: Not on file  Other Topics Concern   Not on file  Social History Narrative   Not on file   Social Determinants of Health   Financial Resource Strain: Low Risk  (05/19/2021)   Overall Financial Resource Strain (CARDIA)    Difficulty of Paying Living Expenses: Not hard at all   Food Insecurity: No Food Insecurity (05/19/2021)   Hunger Vital Sign    Worried About Running Out of Food in the Last Year: Never true    Ran Out of Food in the Last Year: Never true  Transportation Needs: No Transportation Needs (05/19/2021)   PRAPARE - Administrator, Civil Service (Medical): No    Lack of Transportation (Non-Medical): No  Physical Activity: Not on file  Stress: No Stress Concern Present (05/19/2021)   [employer] of Occupational Health - Occupational Stress Questionnaire    Feeling of Stress : Not at all  Social Connections: Unknown (05/19/2021)   Social Connection and Isolation Panel [NHANES]    Frequency of Communication with Friends and Family: Not on file    Frequency of Social Gatherings with Friends and Family: Not on file    Attends Religious Services: Not on file    Active Member of Clubs or Organizations: Not on file    Attends Banker Meetings: Not on file    Marital Status: Married     Review of Systems  Constitutional:  Negative for appetite change and unexpected weight change.  HENT:  Negative for congestion and sinus pressure.   Respiratory:  Negative for cough, chest tightness and shortness of breath.   Cardiovascular:  Negative for chest pain and palpitations.  Gastrointestinal:  Negative for abdominal pain, diarrhea, nausea and vomiting.  Genitourinary:  Negative for difficulty urinating and dysuria.  Musculoskeletal:  Negative for joint swelling and myalgias.  Skin:  Negative for color change and rash.  Neurological:  Negative for dizziness and headaches.  Psychiatric/Behavioral:  Negative for agitation and dysphoric mood.        Objective:     BP 120/74   Pulse 84   Temp 98.2 F (36.8 C)   Resp 16   Ht 6' (1.829 m)   Wt 147 lb 9.6 oz (67 kg)   SpO2 98%   BMI 20.02 kg/m  Wt Readings from Last 3 Encounters:  05/17/22 147 lb 9.6 oz (67 kg)  03/22/22 150 lb 3.2 oz (68.1 kg)  01/20/22 149 lb 9.6 oz  (67.9 kg)    Physical Exam Vitals reviewed.  Constitutional:      General: He is not in acute distress.    Appearance: Normal appearance. He is well-developed.  HENT:     Head: Normocephalic and atraumatic.     Right Ear: External ear normal.     Left Ear: External ear normal.  Eyes:     General: No scleral icterus.       Right eye: No discharge.        Left eye: No discharge.     Conjunctiva/sclera: Conjunctivae normal.  Cardiovascular:     Rate and Rhythm: Normal rate and regular rhythm.  Pulmonary:     Effort: Pulmonary effort is normal. No respiratory distress.     Breath sounds: Normal breath sounds.  Abdominal:     General: Bowel sounds are normal.     Palpations: Abdomen is soft.     Tenderness: There is no abdominal tenderness.  Musculoskeletal:        General: No swelling or tenderness.     Cervical back: Neck supple. No tenderness.  Lymphadenopathy:     Cervical: No cervical adenopathy.  Skin:    Findings: No erythema or rash.  Neurological:     Mental Status: He is alert.  Psychiatric:        Mood and Affect: Mood normal.        Behavior: Behavior normal.      Outpatient Encounter Medications as of 05/17/2022  Medication Sig   amLODipine (NORVASC) 2.5 MG tablet TAKE 1 TABLET(2.5 MG) BY MOUTH DAILY   aspirin EC 81 MG tablet Take 81 mg by mouth daily.   Cholecalciferol (VITAMIN D-1000 MAX ST) 25 MCG (1000 UT) tablet Take 2,000 Units by mouth 2 (two) times daily.   famotidine (PEPCID) 20 MG tablet Take 20 mg by mouth daily.   losartan (COZAAR) 100 MG tablet Take 1 tablet (100 mg total) by mouth daily.   pantoprazole (PROTONIX) 40 MG tablet TAKE 1 TABLET BY MOUTH EVERY DAY   rosuvastatin (CRESTOR) 10 MG tablet Take 1 tablet (10 mg total) by mouth daily.   [DISCONTINUED] acetaminophen (TYLENOL) 500 MG tablet Take 500 mg by mouth every 6 (six) hours as needed. 2-3 daily   No facility-administered encounter medications on file as of 05/17/2022.     Lab  Results  Component Value Date   WBC 5.5 03/22/2022   HGB 13.9 03/22/2022   HCT 41.4 03/22/2022   PLT 283.0 03/22/2022   GLUCOSE 88 03/18/2022  CHOL 168 03/18/2022   TRIG 95.0 03/18/2022   HDL 87.40 03/18/2022   LDLDIRECT 144.2 08/22/2012   LDLCALC 61 03/18/2022   ALT 34 03/22/2022   AST 27 03/22/2022   NA 134 (L) 03/18/2022   K 4.7 03/18/2022   CL 99 03/18/2022   CREATININE 0.86 03/18/2022   BUN 11 03/18/2022   CO2 27 03/18/2022   TSH 1.22 03/18/2022   PSA 2.46 03/22/2022    [place] Extrem Low Right Ltd  Result Date: 06/05/2019 CLINICAL DATA:  Bulbous lesion posterior RIGHT thigh. Status post dog bite 3 weeks ago. EXAM: ULTRASOUND RIGHT LOWER EXTREMITY LIMITED TECHNIQUE: Ultrasound examination of the lower extremity soft tissues was performed in the area of clinical concern. COMPARISON:  None. FINDINGS: Complex collection within the RIGHT posterior-lateral thigh, measuring 5.3 x 2.2 x 4.5 cm, without internal vascularity, most likely hematoma. No surround hypervascularity to suggest abscess. IMPRESSION: Probable hematoma within the soft tissues of the RIGHT posterior-lateral thigh, measuring 5.3 cm greatest dimension, much less likely to represent abscess. Electronically Signed   By: Bary Richard M.D.   On: 06/05/2019 14:14       Assessment & Plan:  Abnormal liver function tests Assessment & Plan: Noted on recent lab.  Recent GI illness as outlined.  Resolved.  3-4 beers per night.  No binge drinking.  Discussed decreasing alcohol intake.  Follow liver panel.    Orders: -     Hepatic function panel; Future  Essential (primary) hypertension Assessment & Plan: Continue amlodipine and losartan.  Blood pressure dong well.  Follow pressures.  Follow metabolic panel.   Orders: -     Basic metabolic panel; Future  Gastroesophageal reflux disease, unspecified whether esophagitis present Assessment & Plan: Saw GI. Continue protonix.    OSA (obstructive sleep apnea) Assessment &  Plan: Pulmonary 12/2021 - referral to orthodontics , Dr. Myrtis Ser for oral appliance for sleep apnea.    Pure hypercholesterolemia Assessment & Plan: On crestor.  Low cholesterol diet and exercise.  Follow lipid panel and liver function tests.    Orders: -     Lipid panel; Future -     Hepatic function panel; Future  Stress Assessment & Plan: Overall improved.  Retired.  Follow.    H/O syncope Assessment & Plan: Has f/u cardiology 09/06/21 - near syncope - etiology unclear s/p holter, echo and stress test. Reevaluated by Dr Juliann Pares recently.  Referred back to neurology.  Gets a warning prior to occurring.  Saw neurology.  W/up planned as outlined.  Planning for MRI and sleep deprived EEG.  Discussed lamictal.       Dale Lancaster, MD

## 2022-05-20 ENCOUNTER — Ambulatory Visit
Admission: RE | Admit: 2022-05-20 | Discharge: 2022-05-20 | Disposition: A | Discharge status: Home / Self Care | Payer: Medicare PPO | Source: Ambulatory Visit | Attending: Neurology | Admitting: Neurology

## 2022-05-20 DIAGNOSIS — R55 Syncope and collapse: Secondary | ICD-10-CM | POA: Insufficient documentation

## 2022-05-20 MED ORDER — GADOBUTROL 1 MMOL/ML IV SOLN
6.0000 mL | Freq: Once | INTRAVENOUS | Status: AC | PRN
  Administered 2022-05-20: 6 mL via INTRAVENOUS

## 2022-05-22 ENCOUNTER — Encounter: Payer: Self-pay | Admitting: Internal Medicine

## 2022-05-22 NOTE — Assessment & Plan Note (Signed)
Has f/u cardiology 09/06/21 - near syncope - etiology unclear s/p holter, echo and stress test. Reevaluated by Dr Juliann Pares recently.  Referred back to neurology.  Gets a warning prior to occurring.  Saw neurology.  W/up planned as outlined.  Planning for MRI and sleep deprived EEG.  Discussed lamictal.

## 2022-05-22 NOTE — Assessment & Plan Note (Signed)
Overall improved.  Retired.  Follow.  

## 2022-05-22 NOTE — Assessment & Plan Note (Signed)
On crestor.  Low cholesterol diet and exercise.  Follow lipid panel and liver function tests.   

## 2022-05-22 NOTE — Assessment & Plan Note (Signed)
Noted on recent lab.  Recent GI illness as outlined.  Resolved.  3-4 beers per night.  No binge drinking.  Discussed decreasing alcohol intake.  Follow liver panel.

## 2022-05-22 NOTE — Assessment & Plan Note (Addendum)
Saw GI. Continue protonix.

## 2022-05-22 NOTE — Assessment & Plan Note (Signed)
Continue amlodipine and losartan.  Blood pressure dong well.  Follow pressures.  Follow metabolic panel.  

## 2022-05-22 NOTE — Assessment & Plan Note (Signed)
Pulmonary 12/2021 - referral to orthodontics , Dr. Katz for oral appliance for sleep apnea.  

## 2022-05-23 ENCOUNTER — Telehealth: Payer: Self-pay | Admitting: Internal Medicine

## 2022-05-23 NOTE — Telephone Encounter (Signed)
Contacted [name] to schedule their annual wellness visit. Appointment made for 05/27/2022.  Thank you,  [facility] Support [facility] Direct dial  [phone]

## 2022-05-27 ENCOUNTER — Ambulatory Visit (INDEPENDENT_AMBULATORY_CARE_PROVIDER_SITE_OTHER): Payer: Medicare PPO

## 2022-05-27 VITALS — Ht 72.0 in | Wt 147.0 lb

## 2022-05-27 DIAGNOSIS — Z0000 Encounter for general adult medical examination without abnormal findings: Secondary | ICD-10-CM

## 2022-05-27 NOTE — Patient Instructions (Addendum)
Scott Cohen , Thank you for taking time to come for your Medicare Wellness Visit. I appreciate your ongoing commitment to your health goals. Please review the following plan we discussed and let me know if I can assist you in the future.   These are the goals we discussed:  Goals      Complete Advanced Care Planning     Continue to plan        This is a list of the screening recommended for you and due dates:  Health Maintenance  Topic Date Due   COVID-19 Vaccine (4 - 2023-24 season) 06/02/2022*   Zoster (Shingles) Vaccine (2 of 2) 06/20/2022*   Pneumonia Vaccine (2 of 2 - PPSV23 or PCV20) 05/17/2023*   Flu Shot  09/01/2022   Medicare Annual Wellness Visit  05/27/2023   Colon Cancer Screening  01/06/2028   DTaP/Tdap/Td vaccine (2 - Td or Tdap) 05/17/2029   Hepatitis C Screening: USPSTF Recommendation to screen - Ages 56-79 yo.  Completed   HPV Vaccine  Aged Out  *Topic was postponed. The date shown is not the original due date.    Conditions/risks identified: none new  Next appointment: Follow up in one year for your annual wellness visit.   Preventive Care 60 Years and Older, Male  Preventive care refers to lifestyle choices and visits with your health care provider that can promote health and wellness. What does preventive care include? A yearly physical exam. This is also called an annual well check. Dental exams once or twice a year. Routine eye exams. Ask your health care provider how often you should have your eyes checked. Personal lifestyle choices, including: Daily care of your teeth and gums. Regular physical activity. Eating a healthy diet. Avoiding tobacco and drug use. Limiting alcohol use. Practicing safe sex. Taking low doses of aspirin every day. Taking vitamin and mineral supplements as recommended by your health care provider. What happens during an annual well check? The services and screenings done by your health care provider during your annual well  check will depend on your age, overall health, lifestyle risk factors, and family history of disease. Counseling  Your health care provider may ask you questions about your: Alcohol use. Tobacco use. Drug use. Emotional well-being. Home and relationship well-being. Sexual activity. Eating habits. History of falls. Memory and ability to understand (cognition). Work and work Astronomer. Screening  You may have the following tests or measurements: Height, weight, and BMI. Blood pressure. Lipid and cholesterol levels. These may be checked every 5 years, or more frequently if you are over 67 years old. Skin check. Lung cancer screening. You may have this screening every year starting at age 58 if you have a 30-pack-year history of smoking and currently smoke or have quit within the past 15 years. Fecal occult blood test (FOBT) of the stool. You may have this test every year starting at age 37. Flexible sigmoidoscopy or colonoscopy. You may have a sigmoidoscopy every 5 years or a colonoscopy every 10 years starting at age 67. Prostate cancer screening. Recommendations will vary depending on your family history and other risks. Hepatitis C blood test. Hepatitis B blood test. Sexually transmitted disease (STD) testing. Diabetes screening. This is done by checking your blood sugar (glucose) after you have not eaten for a while (fasting). You may have this done every 1-3 years. Abdominal aortic aneurysm (AAA) screening. You may need this if you are a current or former smoker. Osteoporosis. You may be screened starting at  age 85 if you are at high risk. Talk with your health care provider about your test results, treatment options, and if necessary, the need for more tests. Vaccines  Your health care provider may recommend certain vaccines, such as: Influenza vaccine. This is recommended every year. Tetanus, diphtheria, and acellular pertussis (Tdap, Td) vaccine. You may need a Td booster  every 10 years. Zoster vaccine. You may need this after age 72. Pneumococcal 13-valent conjugate (PCV13) vaccine. One dose is recommended after age 72. Pneumococcal polysaccharide (PPSV23) vaccine. One dose is recommended after age 72. Talk to your health care provider about which screenings and vaccines you need and how often you need them. This information is not intended to replace advice given to you by your health care provider. Make sure you discuss any questions you have with your health care provider. Document Released: 02/13/2015 Document Revised: 10/07/2015 Document Reviewed: 11/18/2014 Elsevier Interactive Patient Education  2017 [place] Prevention in the Home Falls can cause injuries. They can happen to people of all ages. There are many things you can do to make your home safe and to help prevent falls. What can I do on the outside of my home? Regularly fix the edges of walkways and driveways and fix any cracks. Remove anything that might make you trip as you walk through a door, such as a raised step or threshold. Trim any bushes or trees on the path to your home. Use bright outdoor lighting. Clear any walking paths of anything that might make someone trip, such as rocks or tools. Regularly check to see if handrails are loose or broken. Make sure that both sides of any steps have handrails. Any raised decks and porches should have guardrails on the edges. Have any leaves, snow, or ice cleared regularly. Use sand or salt on walking paths during winter. Clean up any spills in your garage right away. This includes oil or grease spills. What can I do in the bathroom? Use night lights. Install grab bars by the toilet and in the tub and shower. Do not use towel bars as grab bars. Use non-skid mats or decals in the tub or shower. If you need to sit down in the shower, use a plastic, non-slip stool. Keep the floor dry. Clean up any water that spills on the floor as soon  as it happens. Remove soap buildup in the tub or shower regularly. Attach bath mats securely with double-sided non-slip rug tape. Do not have throw rugs and other things on the floor that can make you trip. What can I do in the bedroom? Use night lights. Make sure that you have a light by your bed that is easy to reach. Do not use any sheets or blankets that are too big for your bed. They should not hang down onto the floor. Have a firm chair that has side arms. You can use this for support while you get dressed. Do not have throw rugs and other things on the floor that can make you trip. What can I do in the kitchen? Clean up any spills right away. Avoid walking on wet floors. Keep items that you use a lot in easy-to-reach places. If you need to reach something above you, use a strong step stool that has a grab bar. Keep electrical cords out of the way. Do not use floor polish or wax that makes floors slippery. If you must use wax, use non-skid floor wax. Do not have throw rugs and  other things on the floor that can make you trip. What can I do with my stairs? Do not leave any items on the stairs. Make sure that there are handrails on both sides of the stairs and use them. Fix handrails that are broken or loose. Make sure that handrails are as long as the stairways. Check any carpeting to make sure that it is firmly attached to the stairs. Fix any carpet that is loose or worn. Avoid having throw rugs at the top or bottom of the stairs. If you do have throw rugs, attach them to the floor with carpet tape. Make sure that you have a light switch at the top of the stairs and the bottom of the stairs. If you do not have them, ask someone to add them for you. What else can I do to help prevent falls? Wear shoes that: Do not have high heels. Have rubber bottoms. Are comfortable and fit you well. Are closed at the toe. Do not wear sandals. If you use a stepladder: Make sure that it is fully  opened. Do not climb a closed stepladder. Make sure that both sides of the stepladder are locked into place. Ask someone to hold it for you, if possible. Clearly mark and make sure that you can see: Any grab bars or handrails. First and last steps. Where the edge of each step is. Use tools that help you move around (mobility aids) if they are needed. These include: Canes. Walkers. Scooters. Crutches. Turn on the lights when you go into a dark area. Replace any light bulbs as soon as they burn out. Set up your furniture so you have a clear path. Avoid moving your furniture around. If any of your floors are uneven, fix them. If there are any pets around you, be aware of where they are. Review your medicines with your doctor. Some medicines can make you feel dizzy. This can increase your chance of falling. Ask your doctor what other things that you can do to help prevent falls. This information is not intended to replace advice given to you by your health care provider. Make sure you discuss any questions you have with your health care provider. Document Released: 11/13/2008 Document Revised: 06/25/2015 Document Reviewed: 02/21/2014 Elsevier Interactive Patient Education  2017 [employer].

## 2022-05-27 NOTE — Progress Notes (Signed)
Subjective:   Scott Cohen is a 72 y.o. male who presents for Medicare Annual/Subsequent preventive examination.  Review of Systems    No ROS.  Medicare Wellness Virtual Visit.  Visual/audio telehealth visit, UTA vital signs.   See social history for additional risk factors.   Cardiac Risk Factors include: advanced age (>23men, >15 women);male gender     Objective:    Today's Vitals   05/27/22 1403  Weight: 147 lb (66.7 kg)  Height: 6' (1.829 m)   Body mass index is 19.94 kg/m.     05/27/2022    2:08 PM 05/19/2021    9:34 AM 06/05/2019   12:57 PM 05/18/2019    2:40 PM 07/09/2015   11:56 AM  Advanced Directives  Does Patient Have a Medical Advance Directive? No No No No No  Would patient like information on creating a medical advance directive? No - Patient declined No - Patient declined No - Guardian declined  No - patient declined information    Current Medications (verified) Outpatient Encounter Medications as of 05/27/2022  Medication Sig   amLODipine (NORVASC) 2.5 MG tablet TAKE 1 TABLET(2.5 MG) BY MOUTH DAILY   aspirin EC 81 MG tablet Take 81 mg by mouth daily.   Cholecalciferol (VITAMIN D-1000 MAX ST) 25 MCG (1000 UT) tablet Take 2,000 Units by mouth 2 (two) times daily.   famotidine (PEPCID) 20 MG tablet Take 20 mg by mouth daily.   losartan (COZAAR) 100 MG tablet Take 1 tablet (100 mg total) by mouth daily.   pantoprazole (PROTONIX) 40 MG tablet TAKE 1 TABLET BY MOUTH EVERY DAY   rosuvastatin (CRESTOR) 10 MG tablet Take 1 tablet (10 mg total) by mouth daily.   No facility-administered encounter medications on file as of 05/27/2022.    Allergies (verified) Patient has no known allergies.   History: Past Medical History:  Diagnosis Date   Hypercholesterolemia    Hypertension    Leukopenia    benign cyclical   Past Surgical History:  Procedure Laterality Date   WISDOM TOOTH EXTRACTION     Family History  Problem Relation Age of Onset   Heart disease  Mother        pacemaker   Diabetes Mother    Heart disease Father        myocardial infarction   Hypertension Father    Diabetes Father    CVA Father    Colon cancer Neg Hx    Prostate cancer Neg Hx    Social History   Socioeconomic History   Marital status: Married    Spouse name: Not on file   Number of children: Not on file   Years of education: Not on file   Highest education level: Not on file  Occupational History    Employer: [employer]  Tobacco Use   Smoking status: Never   Smokeless tobacco: Never  Vaping Use   Vaping Use: Never used  Substance and Sexual Activity   Alcohol use: Yes    Alcohol/week: 0.0 standard drinks of alcohol    Comment: 2-3 beers daily   Drug use: No   Sexual activity: Not on file  Other Topics Concern   Not on file  Social History Narrative   Not on file   Social Determinants of Health   Financial Resource Strain: Low Risk  (05/24/2022)   Overall Financial Resource Strain (CARDIA)    Difficulty of Paying Living Expenses: Not hard at all  Food Insecurity: No Food Insecurity (05/24/2022)  Hunger Vital Sign    Worried About Running Out of Food in the Last Year: Never true    Ran Out of Food in the Last Year: Never true  Transportation Needs: No Transportation Needs (05/24/2022)   PRAPARE - Administrator, Civil Service (Medical): No    Lack of Transportation (Non-Medical): No  Physical Activity: Insufficiently Active (05/24/2022)   Exercise Vital Sign    Days of Exercise per Week: 2 days    Minutes of Exercise per Session: 20 min  Stress: No Stress Concern Present (05/24/2022)   [employer] of Occupational Health - Occupational Stress Questionnaire    Feeling of Stress : Not at all  Social Connections: Unknown (05/24/2022)   Social Connection and Isolation Panel [NHANES]    Frequency of Communication with Friends and Family: Twice a week    Frequency of Social Gatherings with Friends and Family: Once a week     Attends Religious Services: Not on Marketing executive or Organizations: Yes    Attends Engineer, structural: More than 4 times per year    Marital Status: Married    Tobacco Counseling Counseling given: Not Answered   Clinical Intake:           Diabetes: No  How often do you need to have someone help you when you read instructions, pamphlets, or other written materials from your doctor or pharmacy?: 1 - Never    Interpreter Needed?: No      Activities of Daily Living    05/24/2022    6:59 AM  In your present state of health, do you have any difficulty performing the following activities:  Hearing? 0  Vision? 0  Difficulty concentrating or making decisions? 0  Walking or climbing stairs? 0  Dressing or bathing? 0  Doing errands, shopping? 0  Preparing Food and eating ? N  Using the Toilet? N  In the past six months, have you accidently leaked urine? N  Do you have problems with loss of bowel control? N  Managing your Medications? N  Managing your Finances? N  Housekeeping or managing your Housekeeping? N    Patient Care Team: Dale [place], MD as PCP - General (Internal Medicine)  Indicate any recent Medical Services you may have received from other than [facility] providers in the past year (date may be approximate).     Assessment:   This is a routine wellness examination for Scott Cohen.  Patient Medicare AWV questionnaire was completed by the patient on 05/24/22, I have confirmed that all information answered by patient is correct and no changes since this date.   I connected with  [name] on 05/27/22 by a audio enabled telemedicine application and verified that I am speaking with the correct person using two identifiers.  Patient Location: Home  Provider Location: Office/Clinic  I discussed the limitations of evaluation and management by telemedicine. The patient expressed understanding and agreed to proceed.   Hearing/Vision  screen Hearing Screening - Comments:: Patient is able to hear conversational tones without difficulty.  No issues reported.   Vision Screening - Comments:: Followed by Dr. Randal Buba, [place] Wears corrective lenses They have seen their ophthalmologist in the last 12 months.   Dietary issues and exercise activities discussed: Current Exercise Habits: Home exercise routine, Type of exercise: walking, Time (Minutes): 20, Frequency (Times/Week): 2, Weekly Exercise (Minutes/Week): 40, Intensity: Mild Regular diet Good water intake    Goals Addressed  This Visit's Progress    Complete Advanced Care Planning       Continue to plan       Depression Screen    05/27/2022    2:08 PM 03/22/2022   10:44 AM 11/16/2021   11:18 AM 08/06/2021    2:31 PM 05/19/2021    9:40 AM 05/12/2021   10:37 AM 01/08/2021    9:57 AM  PHQ 2/9 Scores  PHQ - 2 Score 0 0 0 0 0 0 0    Fall Risk    05/24/2022    6:59 AM 03/22/2022   10:44 AM 11/16/2021   11:18 AM 08/06/2021    2:31 PM 05/19/2021    9:40 AM  Fall Risk   Falls in the past year? 0 0 0 0 0  Number falls in past yr: 0 0 0 0 0  Injury with Fall? 0 0 0 0   Risk for fall due to :  No Fall Risks No Fall Risks No Fall Risks   Follow up Falls evaluation completed;Falls prevention discussed Falls evaluation completed Falls evaluation completed Falls evaluation completed Falls evaluation completed    FALL RISK PREVENTION PERTAINING TO THE HOME: Home free of loose throw rugs in walkways, pet beds, electrical cords, etc? Yes  Adequate lighting in your home to reduce risk of falls? Yes   ASSISTIVE DEVICES UTILIZED TO PREVENT FALLS: Life alert? No  Use of a cane, walker or w/c? No  Grab bars in the bathroom? No  Shower chair or bench in shower? No  Elevated toilet seat or a handicapped toilet? No   TIMED UP AND GO: Was the test performed? No .   Cognitive Function:        05/27/2022    2:09 PM  6CIT Screen  What Year? 0 points   What month? 0 points  What time? 0 points  Count back from 20 0 points  Months in reverse 0 points  Repeat phrase 0 points  Total Score 0 points    Immunizations Immunization History  Administered Date(s) Administered   Fluad Quad(high Dose 65+) 10/18/2018, 10/29/2019, 11/16/2021   Influenza Split 02/09/2012, 12/10/2012   Influenza, High Dose Seasonal PF 01/03/2017   Influenza,inj,Quad PF,6+ Mos 01/07/2015   Influenza-Unspecified 11/27/2017   MMR 12/07/2017   PFIZER(Purple Top)SARS-COV-2 Vaccination 03/18/2019, 04/08/2019, 01/14/2020   Pneumococcal Conjugate-13 11/30/2018   Tdap 05/18/2019   Zoster Recombinat (Shingrix) 12/15/2018   Screening Tests Health Maintenance  Topic Date Due   COVID-19 Vaccine (4 - 2023-24 season) 06/02/2022 (Originally 10/01/2021)   Zoster Vaccines- Shingrix (2 of 2) 06/20/2022 (Originally 02/09/2019)   Pneumonia Vaccine 43+ Years old (2 of 2 - PPSV23 or PCV20) 05/17/2023 (Originally 11/30/2019)   INFLUENZA VACCINE  09/01/2022   Medicare Annual Wellness (AWV)  05/27/2023   COLONOSCOPY (Pts 45-62yrs Insurance coverage will need to be confirmed)  01/06/2028   DTaP/Tdap/Td (2 - Td or Tdap) 05/17/2029   Hepatitis C Screening  Completed   HPV VACCINES  Aged Out   Health Maintenance There are no preventive care reminders to display for this patient.  Lung Cancer Screening: (Low Dose CT Chest recommended if Age 74-80 years, 30 pack-year currently smoking OR have quit w/in 15years.) does not qualify.   Hepatitis C Screening: Completed 05/2021.  Vision Screening: Recommended annual ophthalmology exams for early detection of glaucoma and other disorders of the eye.  Dental Screening: Recommended annual dental exams for proper oral hygiene  Community Resource Referral / Chronic Care Management: CRR  required this visit?  No   CCM required this visit?  No      Plan:     I have personally reviewed and noted the following in the patient's chart:    Medical and social history Use of alcohol, tobacco or illicit drugs  Current medications and supplements including opioid prescriptions. Patient is not currently taking opioid prescriptions. Functional ability and status Nutritional status Physical activity Advanced directives List of other physicians Hospitalizations, surgeries, and ER visits in previous 12 months Vitals Screenings to include cognitive, depression, and falls Referrals and appointments  In addition, I have reviewed and discussed with patient certain preventive protocols, quality metrics, and best practice recommendations. A written personalized care plan for preventive services as well as general preventive health recommendations were provided to patient.     Cathey Endow, LPN   1/61/0960

## 2022-06-03 DIAGNOSIS — R002 Palpitations: Secondary | ICD-10-CM | POA: Diagnosis not present

## 2022-06-03 DIAGNOSIS — R55 Syncope and collapse: Secondary | ICD-10-CM | POA: Diagnosis not present

## 2022-06-03 DIAGNOSIS — R0789 Other chest pain: Secondary | ICD-10-CM | POA: Diagnosis not present

## 2022-06-03 DIAGNOSIS — E7800 Pure hypercholesterolemia, unspecified: Secondary | ICD-10-CM | POA: Diagnosis not present

## 2022-06-03 DIAGNOSIS — I10 Essential (primary) hypertension: Secondary | ICD-10-CM | POA: Diagnosis not present

## 2022-06-03 DIAGNOSIS — K219 Gastro-esophageal reflux disease without esophagitis: Secondary | ICD-10-CM | POA: Diagnosis not present

## 2022-06-03 DIAGNOSIS — R42 Dizziness and giddiness: Secondary | ICD-10-CM | POA: Diagnosis not present

## 2022-06-11 ENCOUNTER — Other Ambulatory Visit: Payer: Self-pay | Admitting: Internal Medicine

## 2022-06-11 DIAGNOSIS — R569 Unspecified convulsions: Secondary | ICD-10-CM | POA: Diagnosis not present

## 2022-07-07 ENCOUNTER — Ambulatory Visit: Payer: Medicare PPO | Admitting: Adult Health

## 2022-07-07 ENCOUNTER — Encounter: Payer: Self-pay | Admitting: Adult Health

## 2022-07-07 VITALS — BP 110/70 | HR 99 | Temp 97.6°F | Ht 72.0 in | Wt 152.2 lb

## 2022-07-07 DIAGNOSIS — G4733 Obstructive sleep apnea (adult) (pediatric): Secondary | ICD-10-CM

## 2022-07-07 NOTE — Progress Notes (Signed)
You  @Patient  ID: [name], male    DOB: 1950/12/10, 72 y.o.   MRN: [id number]  Chief Complaint  Patient presents with   Follow-up    Referring provider: Dale [place], MD  HPI: 72 year old male seen for sleep consult December 02, 2021 for snoring and daytime sleepiness found to have mild sleep apnea  TEST/EVENTS :  Home sleep study January 13, 2022 showed mild sleep apnea AHI 7.3/hour and SpO2 low at 86%   07/07/2022 Follow up ; OSA Patient returns for 49-month follow-up.  Patient was seen last visit after recent home sleep study that showed mild sleep apnea.  Patient has symptoms of snoring and daytime sleepiness.  Patient wanted to proceed with a referral for orthodontics to evaluate for oral appliance.  Patient said that after evaluation he felt that it would not work for him.  Patient says he has been doing positional sleep where he avoid sleeping on his back.  Also sleep with his head of bed up around 30 degrees while he is propped up on pillows.  He does feel that this has helped some with decreased snoring and fatigue.  No Known Allergies  Immunization History  Administered Date(s) Administered   Fluad Quad(high Dose 65+) 10/18/2018, 10/29/2019, 11/16/2021   Influenza Split 02/09/2012, 12/10/2012   Influenza, High Dose Seasonal PF 01/03/2017   Influenza,inj,Quad PF,6+ Mos 01/07/2015   Influenza-Unspecified 11/27/2017   MMR 12/07/2017   PFIZER(Purple Top)SARS-COV-2 Vaccination 03/18/2019, 04/08/2019, 01/14/2020   Pneumococcal Conjugate-13 11/30/2018   Tdap 05/18/2019   Zoster Recombinat (Shingrix) 12/15/2018    Past Medical History:  Diagnosis Date   Hypercholesterolemia    Hypertension    Leukopenia    benign cyclical    Tobacco History: Social History   Tobacco Use  Smoking Status Never  Smokeless Tobacco Never   Counseling given: Not Answered   Outpatient Medications Prior to Visit  Medication Sig Dispense Refill   amLODipine (NORVASC) 2.5 MG tablet  TAKE 1 TABLET(2.5 MG) BY MOUTH DAILY 90 tablet 1   aspirin EC 81 MG tablet Take 81 mg by mouth daily.     Cholecalciferol (VITAMIN D-1000 MAX ST) 25 MCG (1000 UT) tablet Take 2,000 Units by mouth 2 (two) times daily.     doxylamine, Sleep, (UNISOM) 25 MG tablet Take 25 mg by mouth at bedtime as needed.     famotidine (PEPCID) 20 MG tablet Take 20 mg by mouth daily.     losartan (COZAAR) 100 MG tablet Take 1 tablet (100 mg total) by mouth daily. 90 tablet 2   pantoprazole (PROTONIX) 40 MG tablet TAKE 1 TABLET BY MOUTH EVERY DAY 90 tablet 3   rosuvastatin (CRESTOR) 10 MG tablet Take 1 tablet (10 mg total) by mouth daily. 90 tablet 1   No facility-administered medications prior to visit.     Review of Systems:   Constitutional:   No  weight loss, night sweats,  Fevers, chills, fatigue, or  lassitude.  HEENT:   No headaches,  Difficulty swallowing,  Tooth/dental problems, or  Sore throat,                No sneezing, itching, ear ache, nasal congestion, post nasal drip,   CV:  No chest pain,  Orthopnea, PND, swelling in lower extremities, anasarca, dizziness, palpitations, syncope.   GI  No heartburn, indigestion, abdominal pain, nausea, vomiting, diarrhea, change in bowel habits, loss of appetite, bloody stools.   Resp: No shortness of breath with exertion or at rest.  No excess mucus, no productive cough,  No non-productive cough,  No coughing up of blood.  No change in color of mucus.  No wheezing.  No chest wall deformity  Skin: no rash or lesions.  GU: no dysuria, change in color of urine, no urgency or frequency.  No flank pain, no hematuria   MS:  No joint pain or swelling.  No decreased range of motion.  No back pain.    Physical Exam  BP 110/70 (BP Location: Left Arm, Cuff Size: Normal)   Pulse 99   Temp 97.6 F (36.4 C)   Ht 6' (1.829 m)   Wt 152 lb 3.2 oz (69 kg)   SpO2 96%   BMI 20.64 kg/m   GEN: A/Ox3; pleasant , NAD, well nourished    HEENT:  [place]/AT,    NOSE-clear, THROAT-clear, no lesions, no postnasal drip or exudate noted. Class 2-3 MP airway   NECK:  Supple w/ fair ROM; no JVD; normal carotid impulses w/o bruits; no thyromegaly or nodules palpated; no lymphadenopathy.    RESP  Clear  P & A; w/o, wheezes/ rales/ or rhonchi. no accessory muscle use, no dullness to percussion  CARD:  RRR, no m/r/g, no peripheral edema, pulses intact, no cyanosis or clubbing.  GI:   Soft & nt; nml bowel sounds; no organomegaly or masses detected.   Musco: Warm bil, no deformities or joint swelling noted.   Neuro: alert, no focal deficits noted.    Skin: Warm, no lesions or rashes    Lab Results:  CBC    BNP No results found for: "BNP"  ProBNP No results found for: "PROBNP"  Imaging: No results found.        No data to display          No results found for: "NITRICOXIDE"      Assessment & Plan:   OSA (obstructive sleep apnea) Mild active sleep apnea.  Patient did have an evaluation for oral appliance but opted to do positional sleep instead.  Feels that with keeping his head of the bed or sleep at an incline has really helped along with also avoiding sleeping on his back.  Clinically he does feel improved.  Advise if symptoms increase and wants to try CPAP he can give [place] a call back.  Plan Patient Instructions  Sleep with head of bed elevated at 30 degrees Avoid sleeping on back .  Work on healthy sleep regimen  Do not drive if sleepy  Follow up As needed        Rubye Oaks, NP 07/07/2022

## 2022-07-07 NOTE — Patient Instructions (Addendum)
Sleep with head of bed elevated at 30 degrees Avoid sleeping on back .  Work on healthy sleep regimen  Do not drive if sleepy  Follow up As needed

## 2022-07-07 NOTE — Progress Notes (Signed)
Reviewed and agree with assessment/plan.   Nephi Savage, MD [facility] 07/07/2022, 4:13 PM Pager:  [phone]  

## 2022-07-07 NOTE — Assessment & Plan Note (Signed)
Mild active sleep apnea.  Patient did have an evaluation for oral appliance but opted to do positional sleep instead.  Feels that with keeping his head of the bed or sleep at an incline has really helped along with also avoiding sleeping on his back.  Clinically he does feel improved.  Advise if symptoms increase and wants to try CPAP he can give [place] a call back.  Plan Patient Instructions  Sleep with head of bed elevated at 30 degrees Avoid sleeping on back .  Work on healthy sleep regimen  Do not drive if sleepy  Follow up As needed

## 2022-08-05 ENCOUNTER — Telehealth: Payer: Self-pay | Admitting: Internal Medicine

## 2022-08-05 NOTE — Telephone Encounter (Signed)
  Symptoms:stung by yellow jackets/2x. Swelling on hand only, no other symptoms      Attributing factors (medication changes, positional changes, etc. )      Duration : Stung yesterday     Pain Scale?  On 1-10 how woiuld you rate your pain? What makes it better or worse?      Blood pressure            Pulse             Temp

## 2022-08-05 NOTE — Telephone Encounter (Signed)
Agree with evaluation if persistent swelling - extending up arm and already taking atc antihistamines.

## 2022-08-05 NOTE — Telephone Encounter (Signed)
FYI-  Patient was stung by a yellow jacket twice yesterday. The place on his skull where he was stung is fine but the bee sting on his hand is still swollen despite taking oral benadryl since 10 am yesterday morning. The swelling goes about 6 inches up his arm. Denies any other symptoms. Patient was wondering if there were any OTC suggestions he could try. Advised given the swelling despite taking antihistamines- needs to be evaluated today to confirm no further treatment needed. Will f/u with Dr Lorin Picket on 7/16.

## 2022-08-11 ENCOUNTER — Other Ambulatory Visit (INDEPENDENT_AMBULATORY_CARE_PROVIDER_SITE_OTHER): Payer: Medicare PPO

## 2022-08-11 DIAGNOSIS — E7800 Pure hypercholesterolemia, unspecified: Secondary | ICD-10-CM

## 2022-08-11 DIAGNOSIS — I10 Essential (primary) hypertension: Secondary | ICD-10-CM

## 2022-08-11 DIAGNOSIS — R7989 Other specified abnormal findings of blood chemistry: Secondary | ICD-10-CM | POA: Diagnosis not present

## 2022-08-11 LAB — HEPATIC FUNCTION PANEL
ALT: 25 U/L (ref 0–53)
AST: 26 U/L (ref 0–37)
Albumin: 4.4 g/dL (ref 3.5–5.2)
Alkaline Phosphatase: 71 U/L (ref 39–117)
Bilirubin, Direct: 0.1 mg/dL (ref 0.0–0.3)
Total Bilirubin: 0.5 mg/dL (ref 0.2–1.2)
Total Protein: 6.9 g/dL (ref 6.0–8.3)

## 2022-08-11 LAB — LIPID PANEL
Cholesterol: 164 mg/dL (ref 0–200)
HDL: 68.7 mg/dL (ref >39.00)
LDL Cholesterol: 76 mg/dL (ref 0–99)
NonHDL: 95.69
Total CHOL/HDL Ratio: 2
Triglycerides: 97 mg/dL (ref 0.0–149.0)
VLDL: 19.4 mg/dL (ref 0.0–40.0)

## 2022-08-11 LAB — BASIC METABOLIC PANEL
BUN: 13 mg/dL (ref 6–23)
CO2: 28 mEq/L (ref 19–32)
Calcium: 9.5 mg/dL (ref 8.4–10.5)
Chloride: 100 mEq/L (ref 96–112)
Creatinine, Ser: 0.87 mg/dL (ref 0.40–1.50)
GFR: 86.56 mL/min (ref >60.00)
Glucose, Bld: 86 mg/dL (ref 70–99)
Potassium: 4.2 mEq/L (ref 3.5–5.1)
Sodium: 135 mEq/L (ref 135–145)

## 2022-08-16 ENCOUNTER — Ambulatory Visit: Payer: Medicare PPO | Admitting: Internal Medicine

## 2022-08-21 NOTE — Progress Notes (Unsigned)
Subjective:    Patient ID: Scott Cohen, male    DOB: 12-Jan-1951, 72 y.o.   MRN: [id number]  Patient here for No chief complaint on file.   HPI Here for follow up regarding hypercholesterolemia and hypertension.  Continues on amlodipine and losartan. Saw Dr Sherryll Burger - 05/12/22 - for continued episodes of weakness, turning pale and losing touch with reality - without LOC. Recommended MRI brain (Negative MRI Brain except mild punctate micro hemorrhage) and sleep deprived EEG - negative. . Discussed starting lamictal. Last episode - 04/2022. Has had extensive cardiac w/up.  Had f/u with cardiology 06/03/22 - no changes made.  Recommended f/u in 6 months. Previous HST - mild sleep apnea. He did have an evaluation for oral appliance but opted to do positional sleep instead. Feels that with keeping his head of the bed or sleep at an incline has really helped along with also avoiding sleeping on his back.  Pulmonary 07/07/22 - following.    Past Medical History:  Diagnosis Date   Hypercholesterolemia    Hypertension    Leukopenia    benign cyclical   Past Surgical History:  Procedure Laterality Date   WISDOM TOOTH EXTRACTION     Family History  Problem Relation Age of Onset   Heart disease Mother        pacemaker   Diabetes Mother    Heart disease Father        myocardial infarction   Hypertension Father    Diabetes Father    CVA Father    Colon cancer Neg Hx    Prostate cancer Neg Hx    Social History   Socioeconomic History   Marital status: Married    Spouse name: Not on file   Number of children: Not on file   Years of education: Not on file   Highest education level: Bachelor's degree (e.g., BA, AB, BS)  Occupational History    Employer: [employer]  Tobacco Use   Smoking status: Never   Smokeless tobacco: Never  Vaping Use   Vaping status: Never Used  Substance and Sexual Activity   Alcohol use: Yes    Alcohol/week: 0.0 standard drinks of alcohol    Comment: 2-3 beers  daily   Drug use: No   Sexual activity: Not on file  Other Topics Concern   Not on file  Social History Narrative   Not on file   Social Determinants of Health   Financial Resource Strain: Low Risk  (08/18/2022)   Overall Financial Resource Strain (CARDIA)    Difficulty of Paying Living Expenses: Not very hard  Food Insecurity: No Food Insecurity (08/18/2022)   Hunger Vital Sign    Worried About Running Out of Food in the Last Year: Never true    Ran Out of Food in the Last Year: Never true  Transportation Needs: No Transportation Needs (08/18/2022)   PRAPARE - Administrator, Civil Service (Medical): No    Lack of Transportation (Non-Medical): No  Physical Activity: Insufficiently Active (08/18/2022)   Exercise Vital Sign    Days of Exercise per Week: 2 days    Minutes of Exercise per Session: 20 min  Stress: Patient Declined (08/18/2022)   [employer] of Occupational Health - Occupational Stress Questionnaire    Feeling of Stress : Patient declined  Social Connections: Socially Integrated (08/18/2022)   Social Connection and Isolation Panel [NHANES]    Frequency of Communication with Friends and Family: Twice a week  Frequency of Social Gatherings with Friends and Family: Twice a week    Attends Religious Services: More than 4 times per year    Active Member of [employer] or Organizations: Yes    Attends Engineer, structural: More than 4 times per year    Marital Status: Married     Review of Systems     Objective:     There were no vitals taken for this visit. Wt Readings from Last 3 Encounters:  07/07/22 152 lb 3.2 oz (69 kg)  05/27/22 147 lb (66.7 kg)  05/17/22 147 lb 9.6 oz (67 kg)    Physical Exam   Outpatient Encounter Medications as of 08/22/2022  Medication Sig   amLODipine (NORVASC) 2.5 MG tablet TAKE 1 TABLET(2.5 MG) BY MOUTH DAILY   aspirin EC 81 MG tablet Take 81 mg by mouth daily.   Cholecalciferol (VITAMIN D-1000 MAX ST) 25  MCG (1000 UT) tablet Take 2,000 Units by mouth 2 (two) times daily.   doxylamine, Sleep, (UNISOM) 25 MG tablet Take 25 mg by mouth at bedtime as needed.   famotidine (PEPCID) 20 MG tablet Take 20 mg by mouth daily.   losartan (COZAAR) 100 MG tablet Take 1 tablet (100 mg total) by mouth daily.   pantoprazole (PROTONIX) 40 MG tablet TAKE 1 TABLET BY MOUTH EVERY DAY   rosuvastatin (CRESTOR) 10 MG tablet Take 1 tablet (10 mg total) by mouth daily.   No facility-administered encounter medications on file as of 08/22/2022.     Lab Results  Component Value Date   WBC 5.5 03/22/2022   HGB 13.9 03/22/2022   HCT 41.4 03/22/2022   PLT 283.0 03/22/2022   GLUCOSE 86 08/11/2022   CHOL 164 08/11/2022   TRIG 97.0 08/11/2022   HDL 68.70 08/11/2022   LDLDIRECT 144.2 08/22/2012   LDLCALC 76 08/11/2022   ALT 25 08/11/2022   AST 26 08/11/2022   NA 135 08/11/2022   K 4.2 08/11/2022   CL 100 08/11/2022   CREATININE 0.87 08/11/2022   BUN 13 08/11/2022   CO2 28 08/11/2022   TSH 1.22 03/18/2022   PSA 2.46 03/22/2022    MR BRAIN W WO CONTRAST  Result Date: 05/23/2022 CLINICAL DATA:  Provided history: Syncope, unspecified syncope type. EXAM: MRI HEAD WITHOUT AND WITH CONTRAST TECHNIQUE: Multiplanar, multiecho pulse sequences of the brain and surrounding structures were obtained without and with intravenous contrast. CONTRAST:  6mL GADAVIST GADOBUTROL 1 MMOL/ML IV SOLN COMPARISON:  No pertinent prior exams available for comparison. FINDINGS: Brain: No age advanced or lobar predominant parenchymal atrophy. Punctate chronic microhemorrhage within the right parietal lobe. Prominent perivascular spaces within the inferior left basal ganglia. No cortical encephalomalacia is identified. No significant cerebral white matter disease for age. There is no acute infarct. No evidence of an intracranial mass. No extra-axial fluid collection. No midline shift. No pathologic intracranial enhancement identified. Vascular:  Maintained flow voids within the proximal large arterial vessels. Skull and upper cervical spine: No focal suspicious marrow lesion. Sinuses/Orbits: No mass or acute finding within the imaged orbits. No significant paranasal sinus disease. IMPRESSION: 1. No evidence of an acute intracranial abnormality. 2. Punctate chronic microhemorrhage within the right parietal lobe. 3. Otherwise unremarkable MRI appearance of the brain for age. Electronically Signed   By: Jackey Loge D.O.   On: 05/23/2022 18:20       Assessment & Plan:  There are no diagnoses linked to this encounter.   Dale Sturtevant, MD

## 2022-08-22 ENCOUNTER — Ambulatory Visit (INDEPENDENT_AMBULATORY_CARE_PROVIDER_SITE_OTHER): Payer: Medicare PPO | Admitting: Internal Medicine

## 2022-08-22 ENCOUNTER — Encounter: Payer: Self-pay | Admitting: Internal Medicine

## 2022-08-22 VITALS — BP 122/72 | HR 81 | Temp 98.0°F | Resp 16 | Ht 72.0 in | Wt 152.8 lb

## 2022-08-22 DIAGNOSIS — T63451A Toxic effect of venom of hornets, accidental (unintentional), initial encounter: Secondary | ICD-10-CM | POA: Insufficient documentation

## 2022-08-22 DIAGNOSIS — E871 Hypo-osmolality and hyponatremia: Secondary | ICD-10-CM | POA: Diagnosis not present

## 2022-08-22 DIAGNOSIS — M79672 Pain in left foot: Secondary | ICD-10-CM | POA: Insufficient documentation

## 2022-08-22 DIAGNOSIS — I10 Essential (primary) hypertension: Secondary | ICD-10-CM

## 2022-08-22 DIAGNOSIS — M79642 Pain in left hand: Secondary | ICD-10-CM | POA: Insufficient documentation

## 2022-08-22 DIAGNOSIS — F439 Reaction to severe stress, unspecified: Secondary | ICD-10-CM

## 2022-08-22 DIAGNOSIS — R7989 Other specified abnormal findings of blood chemistry: Secondary | ICD-10-CM

## 2022-08-22 DIAGNOSIS — E7800 Pure hypercholesterolemia, unspecified: Secondary | ICD-10-CM | POA: Diagnosis not present

## 2022-08-22 DIAGNOSIS — G4733 Obstructive sleep apnea (adult) (pediatric): Secondary | ICD-10-CM | POA: Diagnosis not present

## 2022-08-22 DIAGNOSIS — K219 Gastro-esophageal reflux disease without esophagitis: Secondary | ICD-10-CM | POA: Diagnosis not present

## 2022-08-22 DIAGNOSIS — Z87828 Personal history of other (healed) physical injury and trauma: Secondary | ICD-10-CM

## 2022-08-22 DIAGNOSIS — T63451D Toxic effect of venom of hornets, accidental (unintentional), subsequent encounter: Secondary | ICD-10-CM

## 2022-08-22 MED ORDER — AMLODIPINE BESYLATE 2.5 MG PO TABS: ORAL_TABLET | ORAL | 1 refills | Status: DC

## 2022-08-22 MED ORDER — LOSARTAN POTASSIUM 100 MG PO TABS: 100.0000 mg | ORAL_TABLET | Freq: Every day | ORAL | 2 refills | Status: DC

## 2022-08-22 MED ORDER — ROSUVASTATIN CALCIUM 10 MG PO TABS: 10.0000 mg | ORAL_TABLET | Freq: Every day | ORAL | 1 refills | Status: DC

## 2022-08-22 MED ORDER — EPINEPHRINE 0.3 MG/0.3ML IJ SOAJ: 0.3000 mg | INTRAMUSCULAR | 0 refills | Status: AC | PRN

## 2022-08-22 MED ORDER — PANTOPRAZOLE SODIUM 40 MG PO TBEC: DELAYED_RELEASE_TABLET | ORAL | 3 refills | Status: DC

## 2022-08-22 NOTE — Assessment & Plan Note (Signed)
Previous HST - mild sleep apnea. He did have an evaluation for oral appliance but opted to do positional sleep instead. Feels that with keeping his head of the bed or sleep at an incline has really helped along with also avoiding sleeping on his back.

## 2022-08-22 NOTE — Assessment & Plan Note (Signed)
Overall improved.  Retired.  Follow.  

## 2022-08-22 NOTE — Assessment & Plan Note (Signed)
On crestor.  Low cholesterol diet and exercise.  Follow lipid panel and liver function tests.

## 2022-08-22 NOTE — Assessment & Plan Note (Signed)
Monitor diet and exercise. No binge drinking.  Discussed decreasing alcohol intake.  Follow liver panel.

## 2022-08-22 NOTE — Assessment & Plan Note (Signed)
Better.  No pain to palpation.  Appears to be more soft tissue involvement, but no abnormality noted on exam.  Follow.

## 2022-08-22 NOTE — Assessment & Plan Note (Signed)
Saw GI. Continue protonix.

## 2022-08-22 NOTE — Assessment & Plan Note (Signed)
Resolved now.  Discussed keeping benadryl with her at all times.  Also epi pen if needed.

## 2022-08-22 NOTE — Assessment & Plan Note (Signed)
Sodium level stable.  Discussed the need to continue decreased alcohol intake.

## 2022-08-22 NOTE — Assessment & Plan Note (Signed)
Continue amlodipine and losartan.  Blood pressure dong well.  Follow pressures.  Follow metabolic panel.  

## 2022-08-22 NOTE — Assessment & Plan Note (Signed)
No pain now.  Support shoes.  Wraps.  Follow.

## 2022-08-23 DIAGNOSIS — R569 Unspecified convulsions: Secondary | ICD-10-CM | POA: Diagnosis not present

## 2022-08-23 DIAGNOSIS — R7989 Other specified abnormal findings of blood chemistry: Secondary | ICD-10-CM | POA: Diagnosis not present

## 2022-08-26 ENCOUNTER — Encounter: Payer: Self-pay | Admitting: Internal Medicine

## 2022-09-05 ENCOUNTER — Telehealth: Payer: Medicare PPO | Admitting: Internal Medicine

## 2022-09-05 ENCOUNTER — Encounter: Payer: Self-pay | Admitting: Internal Medicine

## 2022-09-05 VITALS — BP 118/91 | HR 82 | Temp 99.0°F | Ht 72.0 in | Wt 152.0 lb

## 2022-09-05 DIAGNOSIS — I10 Essential (primary) hypertension: Secondary | ICD-10-CM | POA: Diagnosis not present

## 2022-09-05 DIAGNOSIS — U071 COVID-19: Secondary | ICD-10-CM

## 2022-09-05 NOTE — Progress Notes (Signed)
Patient ID: Scott Cohen, male   DOB: [birth year], 72 y.o.   MRN: [id number]   Virtual Visit via video Note  I connected with Scott Cohen by a video enabled telemedicine application and verified that I am speaking with the correct person using two identifiers. Location patient: home Location provider: work  Persons participating in the virtual visit: patient, provider  The limitations, risks, security and privacy concerns of performing an evaluation and management service by video and the availability of in person appointments have been discussed.  It has also been discussed with the patient that there may be a patient responsible charge related to this service. The patient expressed understanding and agreed to proceed.   Reason for visit: work in appt  HPI: Work in appt - covid positive.  Reports late Friday night - 09/02/22 - noticed increased cough and chills.  Symptoms progressed - sinus pressure, increased post nasal drainage and sore throat.  Tmax 100.  99.5 yesterday and 99 today.  No headache.  No chest pain or sob.  No vomiting or diarrhea. Increased fatigue.  Has taken otc mucinex DM.  Also taking tylenol and ibuprofen.  Using cough drops.  May feel a little better today.     ROS: See pertinent positives and negatives per HPI.  Past Medical History:  Diagnosis Date   Hypercholesterolemia    Hypertension    Leukopenia    benign cyclical    Past Surgical History:  Procedure Laterality Date   WISDOM TOOTH EXTRACTION      Family History  Problem Relation Age of Onset   Heart disease Mother        pacemaker   Diabetes Mother    Heart disease Father        myocardial infarction   Hypertension Father    Diabetes Father    CVA Father    Colon cancer Neg Hx    Prostate cancer Neg Hx     SOCIAL HX: reviewed.    Current Outpatient Medications:    amLODipine (NORVASC) 2.5 MG tablet, TAKE 1 TABLET(2.5 MG) BY MOUTH DAILY, Disp: 90 tablet, Rfl: 1   aspirin EC 81 MG tablet,  Take 81 mg by mouth daily., Disp: , Rfl:    Cholecalciferol (VITAMIN D-1000 MAX ST) 25 MCG (1000 UT) tablet, Take 2,000 Units by mouth 2 (two) times daily., Disp: , Rfl:    doxylamine, Sleep, (UNISOM) 25 MG tablet, Take 25 mg by mouth at bedtime as needed., Disp: , Rfl:    EPINEPHrine (EPIPEN 2-PAK) 0.3 mg/0.3 mL IJ SOAJ injection, Inject 0.3 mg into the muscle as needed for anaphylaxis., Disp: 2 each, Rfl: 0   famotidine (PEPCID) 20 MG tablet, Take 20 mg by mouth daily., Disp: , Rfl:    losartan (COZAAR) 100 MG tablet, Take 1 tablet (100 mg total) by mouth daily., Disp: 90 tablet, Rfl: 2   pantoprazole (PROTONIX) 40 MG tablet, TAKE 1 TABLET BY MOUTH EVERY DAY, Disp: 90 tablet, Rfl: 3   rosuvastatin (CRESTOR) 10 MG tablet, Take 1 tablet (10 mg total) by mouth daily., Disp: 90 tablet, Rfl: 1  EXAM:  VITALS per patient if applicable: temp 99 today.   GENERAL: alert, oriented, appears well and in no acute distress  HEENT: atraumatic, conjunttiva clear, no obvious abnormalities on inspection of external nose and ears  NECK: normal movements of the head and neck  LUNGS: on inspection no signs of respiratory distress, breathing rate appears normal, no obvious gross SOB, gasping or wheezing  CV: no obvious cyanosis  PSYCH/NEURO: pleasant and cooperative, no obvious depression or anxiety, speech and thought processing grossly intact  ASSESSMENT AND PLAN:  Discussed the following assessment and plan:  Problem List Items Addressed This Visit     Essential (primary) hypertension    Continue amlodipine and losartan.  Follow pressures.        COVID-19 virus infection - Primary    Symptoms as outlined - started 09/02/22.  Tested positive for covid yesterday.  Some better today.  No chest pain or sob.  Tolerating po's.  Discussed covid.  Discussed oral antiviral medication and current effectiveness.  Given symptoms seem to have started to improve, will hold on antiviral medication.  Treat  symptoms.  Steroid nasal spray and saline nasal spray as directed.  Continue mucinex DM.  Rest.  Follow closely.  Discussed quarantine guidelines.  Call with update.        Return if symptoms worsen or fail to improve.   I discussed the assessment and treatment plan with the patient. The patient was provided an opportunity to ask questions and all were answered. The patient agreed with the plan and demonstrated an understanding of the instructions.   The patient was advised to call back or seek an in-person evaluation if the symptoms worsen or if the condition fails to improve as anticipated.    D[place], MD

## 2022-09-09 ENCOUNTER — Encounter: Payer: Self-pay | Admitting: Internal Medicine

## 2022-09-11 ENCOUNTER — Encounter: Payer: Self-pay | Admitting: Internal Medicine

## 2022-09-11 DIAGNOSIS — U071 COVID-19: Secondary | ICD-10-CM | POA: Insufficient documentation

## 2022-09-11 NOTE — Assessment & Plan Note (Signed)
Continue amlodipine and losartan.  Follow pressures.

## 2022-09-11 NOTE — Assessment & Plan Note (Signed)
Symptoms as outlined - started 09/02/22.  Tested positive for covid yesterday.  Some better today.  No chest pain or sob.  Tolerating po's.  Discussed covid.  Discussed oral antiviral medication and current effectiveness.  Given symptoms seem to have started to improve, will hold on antiviral medication.  Treat symptoms.  Steroid nasal spray and saline nasal spray as directed.  Continue mucinex DM.  Rest.  Follow closely.  Discussed quarantine guidelines.  Call with update.

## 2022-09-15 DIAGNOSIS — R569 Unspecified convulsions: Secondary | ICD-10-CM | POA: Diagnosis not present

## 2022-09-20 ENCOUNTER — Ambulatory Visit (INDEPENDENT_AMBULATORY_CARE_PROVIDER_SITE_OTHER): Payer: Medicare PPO

## 2022-09-20 ENCOUNTER — Ambulatory Visit: Payer: Medicare PPO | Admitting: Nurse Practitioner

## 2022-09-20 ENCOUNTER — Encounter: Payer: Self-pay | Admitting: Nurse Practitioner

## 2022-09-20 VITALS — BP 120/60 | HR 81 | Temp 97.9°F | Ht 72.0 in | Wt 151.4 lb

## 2022-09-20 DIAGNOSIS — M2012 Hallux valgus (acquired), left foot: Secondary | ICD-10-CM | POA: Diagnosis not present

## 2022-09-20 DIAGNOSIS — M79672 Pain in left foot: Secondary | ICD-10-CM

## 2022-09-20 DIAGNOSIS — M19072 Primary osteoarthritis, left ankle and foot: Secondary | ICD-10-CM | POA: Diagnosis not present

## 2022-09-20 DIAGNOSIS — M7989 Other specified soft tissue disorders: Secondary | ICD-10-CM | POA: Diagnosis not present

## 2022-09-20 NOTE — Progress Notes (Signed)
Bethanie Dicker, NP-C Phone: [phone]  Scott Cohen is a 72 y.o. male who presents today for left foot pain.  Patient with left foot pain x 2 weeks. He believes he may have injured it while playing basketball. Denies any fall. He does not remember twisting it. He does have pain with weight bearing. It is swollen. Denies bruising. He has tried wearing an ankle brace without relief. Most of his pain is occurring across the top of his foot and into his toes.  Social History   Tobacco Use  Smoking Status Never  Smokeless Tobacco Never    Current Outpatient Medications on File Prior to Visit  Medication Sig Dispense Refill   amLODipine (NORVASC) 2.5 MG tablet TAKE 1 TABLET(2.5 MG) BY MOUTH DAILY 90 tablet 1   aspirin EC 81 MG tablet Take 81 mg by mouth daily.     Cholecalciferol (VITAMIN D-1000 MAX ST) 25 MCG (1000 UT) tablet Take 2,000 Units by mouth 2 (two) times daily.     doxylamine, Sleep, (UNISOM) 25 MG tablet Take 25 mg by mouth at bedtime as needed.     EPINEPHrine (EPIPEN 2-PAK) 0.3 mg/0.3 mL IJ SOAJ injection Inject 0.3 mg into the muscle as needed for anaphylaxis. 2 each 0   famotidine (PEPCID) 20 MG tablet Take 20 mg by mouth daily.     losartan (COZAAR) 100 MG tablet Take 1 tablet (100 mg total) by mouth daily. 90 tablet 2   pantoprazole (PROTONIX) 40 MG tablet TAKE 1 TABLET BY MOUTH EVERY DAY 90 tablet 3   rosuvastatin (CRESTOR) 10 MG tablet Take 1 tablet (10 mg total) by mouth daily. 90 tablet 1   No current facility-administered medications on file prior to visit.    ROS see history of present illness  Objective  Physical Exam Vitals:   09/20/22 0844  BP: 120/60  Pulse: 81  Temp: 97.9 F (36.6 C)  SpO2: 100%    BP Readings from Last 3 Encounters:  09/20/22 120/60  09/05/22 (!) 118/91  08/22/22 122/72   Wt Readings from Last 3 Encounters:  09/20/22 151 lb 6.4 oz (68.7 kg)  09/05/22 152 lb (68.9 kg)  08/22/22 152 lb 12.8 oz (69.3 kg)    Physical  Exam Constitutional:      General: He is not in acute distress.    Appearance: Normal appearance.  HENT:     Head: Normocephalic.  Cardiovascular:     Rate and Rhythm: Normal rate and regular rhythm.     Heart sounds: Normal heart sounds.  Pulmonary:     Effort: Pulmonary effort is normal.     Breath sounds: Normal breath sounds.  Musculoskeletal:     Left ankle: Swelling present. No tenderness. Normal range of motion.     Right foot: Normal.     Left foot: Normal range of motion. Swelling and tenderness (dorsal) present.  Skin:    General: Skin is warm and dry.  Neurological:     General: No focal deficit present.     Mental Status: He is alert.  Psychiatric:        Mood and Affect: Mood normal.        Behavior: Behavior normal.    Assessment/Plan: Please see individual problem list.  Left foot pain Assessment & Plan: Will get x-ray today and contact patient with results. Visible swelling today on exam. Encouraged to rest, ice, and elevate. Further work up pending x-ray results.   Orders: -     DG Foot  Complete Left; Future   Return if symptoms worsen or fail to improve.   Bethanie Dicker, NP-C [facility] - [employer]

## 2022-09-22 ENCOUNTER — Encounter: Payer: Self-pay | Admitting: Nurse Practitioner

## 2022-09-27 ENCOUNTER — Other Ambulatory Visit: Payer: Self-pay | Admitting: Nurse Practitioner

## 2022-09-27 DIAGNOSIS — M79672 Pain in left foot: Secondary | ICD-10-CM

## 2022-09-27 NOTE — Assessment & Plan Note (Addendum)
Will get x-ray today and contact patient with results. Visible swelling today on exam. Encouraged to rest, ice, and elevate. Further work up pending x-ray results.

## 2022-10-07 DIAGNOSIS — M19072 Primary osteoarthritis, left ankle and foot: Secondary | ICD-10-CM | POA: Diagnosis not present

## 2022-10-07 DIAGNOSIS — S93602A Unspecified sprain of left foot, initial encounter: Secondary | ICD-10-CM | POA: Diagnosis not present

## 2022-10-17 DIAGNOSIS — R569 Unspecified convulsions: Secondary | ICD-10-CM | POA: Diagnosis not present

## 2022-12-13 DIAGNOSIS — K219 Gastro-esophageal reflux disease without esophagitis: Secondary | ICD-10-CM | POA: Diagnosis not present

## 2022-12-13 DIAGNOSIS — I10 Essential (primary) hypertension: Secondary | ICD-10-CM | POA: Diagnosis not present

## 2022-12-13 DIAGNOSIS — R55 Syncope and collapse: Secondary | ICD-10-CM | POA: Diagnosis not present

## 2022-12-13 DIAGNOSIS — E7800 Pure hypercholesterolemia, unspecified: Secondary | ICD-10-CM | POA: Diagnosis not present

## 2022-12-13 DIAGNOSIS — R42 Dizziness and giddiness: Secondary | ICD-10-CM | POA: Diagnosis not present

## 2022-12-13 DIAGNOSIS — R002 Palpitations: Secondary | ICD-10-CM | POA: Diagnosis not present

## 2022-12-13 DIAGNOSIS — R0789 Other chest pain: Secondary | ICD-10-CM | POA: Diagnosis not present

## 2022-12-14 DIAGNOSIS — D2262 Melanocytic nevi of left upper limb, including shoulder: Secondary | ICD-10-CM | POA: Diagnosis not present

## 2022-12-14 DIAGNOSIS — L821 Other seborrheic keratosis: Secondary | ICD-10-CM | POA: Diagnosis not present

## 2022-12-14 DIAGNOSIS — D2271 Melanocytic nevi of right lower limb, including hip: Secondary | ICD-10-CM | POA: Diagnosis not present

## 2022-12-14 DIAGNOSIS — D2272 Melanocytic nevi of left lower limb, including hip: Secondary | ICD-10-CM | POA: Diagnosis not present

## 2022-12-14 DIAGNOSIS — D225 Melanocytic nevi of trunk: Secondary | ICD-10-CM | POA: Diagnosis not present

## 2022-12-14 DIAGNOSIS — D2261 Melanocytic nevi of right upper limb, including shoulder: Secondary | ICD-10-CM | POA: Diagnosis not present

## 2022-12-21 ENCOUNTER — Other Ambulatory Visit (INDEPENDENT_AMBULATORY_CARE_PROVIDER_SITE_OTHER): Payer: Medicare PPO

## 2022-12-21 DIAGNOSIS — E7800 Pure hypercholesterolemia, unspecified: Secondary | ICD-10-CM | POA: Diagnosis not present

## 2022-12-21 LAB — LIPID PANEL
Cholesterol: 167 mg/dL (ref 0–200)
HDL: 76.8 mg/dL (ref >39.00)
LDL Cholesterol: 72 mg/dL (ref 0–99)
NonHDL: 90.63
Total CHOL/HDL Ratio: 2
Triglycerides: 91 mg/dL (ref 0.0–149.0)
VLDL: 18.2 mg/dL (ref 0.0–40.0)

## 2022-12-21 LAB — BASIC METABOLIC PANEL
BUN: 10 mg/dL (ref 6–23)
CO2: 28 meq/L (ref 19–32)
Calcium: 9.5 mg/dL (ref 8.4–10.5)
Chloride: 99 meq/L (ref 96–112)
Creatinine, Ser: 0.8 mg/dL (ref 0.40–1.50)
GFR: 88.56 mL/min (ref >60.00)
Glucose, Bld: 91 mg/dL (ref 70–99)
Potassium: 4.3 meq/L (ref 3.5–5.1)
Sodium: 134 meq/L — ABNORMAL LOW (ref 135–145)

## 2022-12-21 LAB — HEPATIC FUNCTION PANEL
ALT: 17 U/L (ref 0–53)
AST: 22 U/L (ref 0–37)
Albumin: 4.6 g/dL (ref 3.5–5.2)
Alkaline Phosphatase: 71 U/L (ref 39–117)
Bilirubin, Direct: 0.1 mg/dL (ref 0.0–0.3)
Total Bilirubin: 0.6 mg/dL (ref 0.2–1.2)
Total Protein: 6.7 g/dL (ref 6.0–8.3)

## 2022-12-23 ENCOUNTER — Ambulatory Visit: Payer: Medicare PPO | Admitting: Internal Medicine

## 2022-12-23 ENCOUNTER — Encounter: Payer: Self-pay | Admitting: Internal Medicine

## 2022-12-23 VITALS — BP 118/70 | HR 81 | Temp 97.9°F | Resp 16 | Ht 72.0 in | Wt 146.0 lb

## 2022-12-23 DIAGNOSIS — Z23 Encounter for immunization: Secondary | ICD-10-CM

## 2022-12-23 DIAGNOSIS — I10 Essential (primary) hypertension: Secondary | ICD-10-CM | POA: Diagnosis not present

## 2022-12-23 DIAGNOSIS — E871 Hypo-osmolality and hyponatremia: Secondary | ICD-10-CM | POA: Diagnosis not present

## 2022-12-23 DIAGNOSIS — M79672 Pain in left foot: Secondary | ICD-10-CM

## 2022-12-23 DIAGNOSIS — K219 Gastro-esophageal reflux disease without esophagitis: Secondary | ICD-10-CM

## 2022-12-23 DIAGNOSIS — Z87898 Personal history of other specified conditions: Secondary | ICD-10-CM

## 2022-12-23 DIAGNOSIS — E7800 Pure hypercholesterolemia, unspecified: Secondary | ICD-10-CM

## 2022-12-23 DIAGNOSIS — F439 Reaction to severe stress, unspecified: Secondary | ICD-10-CM

## 2022-12-23 DIAGNOSIS — Z125 Encounter for screening for malignant neoplasm of prostate: Secondary | ICD-10-CM

## 2022-12-23 MED ORDER — ROSUVASTATIN CALCIUM 10 MG PO TABS: 10.0000 mg | ORAL_TABLET | Freq: Every day | ORAL | 3 refills | Status: DC

## 2022-12-23 MED ORDER — AMLODIPINE BESYLATE 2.5 MG PO TABS: ORAL_TABLET | ORAL | 1 refills | Status: DC

## 2022-12-23 MED ORDER — LOSARTAN POTASSIUM 100 MG PO TABS: 100.0000 mg | ORAL_TABLET | Freq: Every day | ORAL | 2 refills | Status: DC

## 2022-12-23 NOTE — Assessment & Plan Note (Signed)
On crestor.  Low cholesterol diet and exercise.  Follow lipid panel and liver function tests.

## 2022-12-23 NOTE — Assessment & Plan Note (Signed)
Had f/u with neurology 10/17/22 - MRI Brain with and without contrast 05/20/2022 - No evidence of an acute intracranial abnormality. Punctate chronic microhemorrhage within the right parietal lobe. 3. Otherwise unremarkable MRI appearance of the brain for age. Sleep deprived EEG 06/11/2022 - This is a normal awake and sleep record, without EEG evidence for focality or epileptiform discharges. Prolonged EEG 09/15/2022 - This is a normal in-office unmonitored EEG study lasting 2 hr 28 min. No typical spell was recorded during this study. No ictal or interictal epileptiform activity was noted.  Did not tolerate trial of lamictal. Off.  Has not had another episode.

## 2022-12-23 NOTE — Assessment & Plan Note (Signed)
Saw GI. Continue protonix.

## 2022-12-23 NOTE — Progress Notes (Signed)
Subjective:    Patient ID: Scott Cohen, male    DOB: 1950-11-15, 72 y.o.   MRN: [id number]  Patient here for  Chief Complaint  Patient presents with   Medical Management of Chronic Issues    HPI Here for follow up regarding hypercholesterolemia and hypertension. Continues on amlodipine and losartan. Recently evaluated by Dr Ether Griffins for left foot pain. Left foot x-rays and these were negative for fracture. Mild decreased joint space to the midfoot region. Mild prominent dorsomedial eminence.  Diagnosed with strain/sprain of left foot.  Recommended compression therapy to the ankle with a sleeve. Wore a compression sock. Helped.  No pain now. Had f/u with neurology 10/17/22 - MRI Brain with and without contrast 05/20/2022 - No evidence of an acute intracranial abnormality. Punctate chronic microhemorrhage within the right parietal lobe. 3. Otherwise unremarkable MRI appearance of the brain for age. Sleep deprived EEG 06/11/2022 - This is a normal awake and sleep record, without EEG evidence for focality or epileptiform discharges. Prolonged EEG 09/15/2022 - This is a normal in-office unmonitored EEG study lasting 2 hr 28 min. No typical spell was recorded during this study. No ictal or interictal epileptiform activity was noted.  Did not tolerate trial of lamictal. Off.  Has not had another episode. Saw Dr Juliann Pares 12/13/22 - no changes made. No chest pain.  Stays active.  Breathing stable. No increased cough or congestion. No abdominal pain or bowel change. Handling stress.    Past Medical History:  Diagnosis Date   Hypercholesterolemia    Hypertension    Leukopenia    benign cyclical   Past Surgical History:  Procedure Laterality Date   WISDOM TOOTH EXTRACTION     Family History  Problem Relation Age of Onset   Heart disease Mother        pacemaker   Diabetes Mother    Heart disease Father        myocardial infarction   Hypertension Father    Diabetes Father    CVA Father    Colon  cancer Neg Hx    Prostate cancer Neg Hx    Social History   Socioeconomic History   Marital status: Married    Spouse name: Not on file   Number of children: Not on file   Years of education: Not on file   Highest education level: Bachelor's degree (e.g., BA, AB, BS)  Occupational History    Employer: [employer]  Tobacco Use   Smoking status: Never   Smokeless tobacco: Never  Vaping Use   Vaping status: Never Used  Substance and Sexual Activity   Alcohol use: Yes    Alcohol/week: 0.0 standard drinks of alcohol    Comment: 2-3 beers daily   Drug use: No   Sexual activity: Not on file  Other Topics Concern   Not on file  Social History Narrative   Not on file   Social Determinants of Health   Financial Resource Strain: Low Risk  (12/20/2022)   Overall Financial Resource Strain (CARDIA)    Difficulty of Paying Living Expenses: Not very hard  Food Insecurity: No Food Insecurity (12/20/2022)   Hunger Vital Sign    Worried About Running Out of Food in the Last Year: Never true    Ran Out of Food in the Last Year: Never true  Transportation Needs: No Transportation Needs (12/20/2022)   PRAPARE - Administrator, Civil Service (Medical): No    Lack of Transportation (Non-Medical): No  Physical Activity: Insufficiently Active (12/20/2022)   Exercise Vital Sign    Days of Exercise per Week: 1 day    Minutes of Exercise per Session: 30 min  Stress: No Stress Concern Present (12/20/2022)   [employer] of Occupational Health - Occupational Stress Questionnaire    Feeling of Stress : Only a little  Social Connections: Moderately Integrated (12/20/2022)   Social Connection and Isolation Panel [NHANES]    Frequency of Communication with Friends and Family: Once a week    Frequency of Social Gatherings with Friends and Family: Once a week    Attends Religious Services: More than 4 times per year    Active Member of [employer] or Organizations: Yes    Attends  Engineer, structural: More than 4 times per year    Marital Status: Married     Review of Systems  Constitutional:  Negative for appetite change and unexpected weight change.  HENT:  Negative for congestion and sinus pressure.   Respiratory:  Negative for cough, chest tightness and shortness of breath.   Cardiovascular:  Negative for chest pain, palpitations and leg swelling.  Gastrointestinal:  Negative for abdominal pain, diarrhea, nausea and vomiting.  Genitourinary:  Negative for difficulty urinating and dysuria.  Musculoskeletal:  Negative for joint swelling and myalgias.  Skin:  Negative for color change and rash.  Neurological:  Negative for dizziness and headaches.  Psychiatric/Behavioral:  Negative for agitation and dysphoric mood.        Objective:     BP 118/70   Pulse 81   Temp 97.9 F (36.6 C)   Resp 16   Ht 6' (1.829 m)   Wt 146 lb (66.2 kg)   SpO2 98%   BMI 19.80 kg/m  Wt Readings from Last 3 Encounters:  12/23/22 146 lb (66.2 kg)  09/20/22 151 lb 6.4 oz (68.7 kg)  09/05/22 152 lb (68.9 kg)    Physical Exam   Outpatient Encounter Medications as of 12/23/2022  Medication Sig   amLODipine (NORVASC) 2.5 MG tablet TAKE 1 TABLET(2.5 MG) BY MOUTH DAILY   aspirin EC 81 MG tablet Take 81 mg by mouth daily.   Cholecalciferol (VITAMIN D-1000 MAX ST) 25 MCG (1000 UT) tablet Take 2,000 Units by mouth 2 (two) times daily.   doxylamine, Sleep, (UNISOM) 25 MG tablet Take 25 mg by mouth at bedtime as needed.   EPINEPHrine (EPIPEN 2-PAK) 0.3 mg/0.3 mL IJ SOAJ injection Inject 0.3 mg into the muscle as needed for anaphylaxis.   famotidine (PEPCID) 20 MG tablet Take 20 mg by mouth daily.   losartan (COZAAR) 100 MG tablet Take 1 tablet (100 mg total) by mouth daily.   pantoprazole (PROTONIX) 40 MG tablet TAKE 1 TABLET BY MOUTH EVERY DAY   rosuvastatin (CRESTOR) 10 MG tablet Take 1 tablet (10 mg total) by mouth daily.   [DISCONTINUED] amLODipine (NORVASC) 2.5 MG  tablet TAKE 1 TABLET(2.5 MG) BY MOUTH DAILY   [DISCONTINUED] losartan (COZAAR) 100 MG tablet Take 1 tablet (100 mg total) by mouth daily.   [DISCONTINUED] rosuvastatin (CRESTOR) 10 MG tablet Take 1 tablet (10 mg total) by mouth daily.   No facility-administered encounter medications on file as of 12/23/2022.     Lab Results  Component Value Date   WBC 5.5 03/22/2022   HGB 13.9 03/22/2022   HCT 41.4 03/22/2022   PLT 283.0 03/22/2022   GLUCOSE 91 12/21/2022   CHOL 167 12/21/2022   TRIG 91.0 12/21/2022   HDL 76.80 12/21/2022  LDLDIRECT 144.2 08/22/2012   LDLCALC 72 12/21/2022   ALT 17 12/21/2022   AST 22 12/21/2022   NA 134 (L) 12/21/2022   K 4.3 12/21/2022   CL 99 12/21/2022   CREATININE 0.80 12/21/2022   BUN 10 12/21/2022   CO2 28 12/21/2022   TSH 1.22 03/18/2022   PSA 2.46 03/22/2022    MR BRAIN W WO CONTRAST  Result Date: 05/23/2022 CLINICAL DATA:  Provided history: Syncope, unspecified syncope type. EXAM: MRI HEAD WITHOUT AND WITH CONTRAST TECHNIQUE: Multiplanar, multiecho pulse sequences of the brain and surrounding structures were obtained without and with intravenous contrast. CONTRAST:  6mL GADAVIST GADOBUTROL 1 MMOL/ML IV SOLN COMPARISON:  No pertinent prior exams available for comparison. FINDINGS: Brain: No age advanced or lobar predominant parenchymal atrophy. Punctate chronic microhemorrhage within the right parietal lobe. Prominent perivascular spaces within the inferior left basal ganglia. No cortical encephalomalacia is identified. No significant cerebral white matter disease for age. There is no acute infarct. No evidence of an intracranial mass. No extra-axial fluid collection. No midline shift. No pathologic intracranial enhancement identified. Vascular: Maintained flow voids within the proximal large arterial vessels. Skull and upper cervical spine: No focal suspicious marrow lesion. Sinuses/Orbits: No mass or acute finding within the imaged orbits. No significant  paranasal sinus disease. IMPRESSION: 1. No evidence of an acute intracranial abnormality. 2. Punctate chronic microhemorrhage within the right parietal lobe. 3. Otherwise unremarkable MRI appearance of the brain for age. Electronically Signed   By: Jackey Loge D.O.   On: 05/23/2022 18:20       Assessment & Plan:  Pure hypercholesterolemia Assessment & Plan: On crestor.  Low cholesterol diet and exercise.  Follow lipid panel and liver function tests.    Orders: -     CBC with Differential/Platelet; Future -     Basic metabolic panel; Future -     Lipid panel; Future -     Hepatic function panel; Future -     TSH; Future  Prostate cancer screening -     PSA, Medicare; Future  Need for influenza vaccination -     Flu Vaccine Trivalent High Dose (Fluad)  Essential (primary) hypertension Assessment & Plan: Continue amlodipine and losartan.  Follow pressures.  Doing well.  No changes.    Gastroesophageal reflux disease, unspecified whether esophagitis present Assessment & Plan: Saw GI. Continue protonix.    H/O syncope Assessment & Plan: Had f/u with neurology 10/17/22 - MRI Brain with and without contrast 05/20/2022 - No evidence of an acute intracranial abnormality. Punctate chronic microhemorrhage within the right parietal lobe. 3. Otherwise unremarkable MRI appearance of the brain for age. Sleep deprived EEG 06/11/2022 - This is a normal awake and sleep record, without EEG evidence for focality or epileptiform discharges. Prolonged EEG 09/15/2022 - This is a normal in-office unmonitored EEG study lasting 2 hr 28 min. No typical spell was recorded during this study. No ictal or interictal epileptiform activity was noted.  Did not tolerate trial of lamictal. Off.  Has not had another episode.   Hyponatremia Assessment & Plan: Sodium level stable.  Discussed the need to continue decreased alcohol intake.    Left foot pain Assessment & Plan: Recently evaluated by Dr Ether Griffins for left  foot pain. Left foot x-rays and these were negative for fracture. Mild decreased joint space to the midfoot region. Mild prominent dorsomedial eminence.  Diagnosed with strain/sprain of left foot.  Recommended compression therapy to the ankle with a sleeve. Wore a compression sock.  Helped.    Stress Assessment & Plan: Overall handling things well.  Follow    Other orders -     amLODIPine Besylate; TAKE 1 TABLET(2.5 MG) BY MOUTH DAILY  Dispense: 90 tablet; Refill: 1 -     Losartan Potassium; Take 1 tablet (100 mg total) by mouth daily.  Dispense: 90 tablet; Refill: 2 -     Rosuvastatin Calcium; Take 1 tablet (10 mg total) by mouth daily.  Dispense: 90 tablet; Refill: 3     Dale , MD

## 2022-12-23 NOTE — Assessment & Plan Note (Signed)
Continue amlodipine and losartan.  Follow pressures.  Doing well.  No changes.

## 2022-12-23 NOTE — Assessment & Plan Note (Signed)
Overall handling things well.  Follow.

## 2022-12-23 NOTE — Assessment & Plan Note (Signed)
Sodium level stable.  Discussed the need to continue decreased alcohol intake.

## 2022-12-23 NOTE — Assessment & Plan Note (Signed)
Recently evaluated by Dr Ether Griffins for left foot pain. Left foot x-rays and these were negative for fracture. Mild decreased joint space to the midfoot region. Mild prominent dorsomedial eminence.  Diagnosed with strain/sprain of left foot.  Recommended compression therapy to the ankle with a sleeve. Wore a compression sock. Helped.

## 2023-03-20 DIAGNOSIS — R569 Unspecified convulsions: Secondary | ICD-10-CM | POA: Diagnosis not present

## 2023-03-20 DIAGNOSIS — G4733 Obstructive sleep apnea (adult) (pediatric): Secondary | ICD-10-CM | POA: Diagnosis not present

## 2023-04-21 ENCOUNTER — Other Ambulatory Visit: Payer: Medicare PPO

## 2023-04-21 DIAGNOSIS — E7800 Pure hypercholesterolemia, unspecified: Secondary | ICD-10-CM | POA: Diagnosis not present

## 2023-04-21 DIAGNOSIS — Z125 Encounter for screening for malignant neoplasm of prostate: Secondary | ICD-10-CM | POA: Diagnosis not present

## 2023-04-21 LAB — CBC WITH DIFFERENTIAL/PLATELET
Basophils Absolute: 0 10*3/uL (ref 0.0–0.1)
Basophils Relative: 1.5 % (ref 0.0–3.0)
Eosinophils Absolute: 0.1 10*3/uL (ref 0.0–0.7)
Eosinophils Relative: 1.7 % (ref 0.0–5.0)
HCT: 39.2 % (ref 39.0–52.0)
Hemoglobin: 13.4 g/dL (ref 13.0–17.0)
Lymphocytes Relative: 46 % (ref 12.0–46.0)
Lymphs Abs: 1.5 10*3/uL (ref 0.7–4.0)
MCHC: 34.1 g/dL (ref 30.0–36.0)
MCV: 93.5 fl (ref 78.0–100.0)
Monocytes Absolute: 0.4 10*3/uL (ref 0.1–1.0)
Monocytes Relative: 13.8 % — ABNORMAL HIGH (ref 3.0–12.0)
Neutro Abs: 1.2 10*3/uL — ABNORMAL LOW (ref 1.4–7.7)
Neutrophils Relative %: 37 % — ABNORMAL LOW (ref 43.0–77.0)
Platelets: 239 10*3/uL (ref 150.0–400.0)
RBC: 4.2 Mil/uL — ABNORMAL LOW (ref 4.22–5.81)
RDW: 12.7 % (ref 11.5–15.5)
WBC: 3.2 10*3/uL — ABNORMAL LOW (ref 4.0–10.5)

## 2023-04-21 LAB — HEPATIC FUNCTION PANEL
ALT: 22 U/L (ref 0–53)
AST: 24 U/L (ref 0–37)
Albumin: 4.4 g/dL (ref 3.5–5.2)
Alkaline Phosphatase: 66 U/L (ref 39–117)
Bilirubin, Direct: 0.1 mg/dL (ref 0.0–0.3)
Total Bilirubin: 0.5 mg/dL (ref 0.2–1.2)
Total Protein: 6.5 g/dL (ref 6.0–8.3)

## 2023-04-21 LAB — LIPID PANEL
Cholesterol: 173 mg/dL (ref 0–200)
HDL: 87 mg/dL (ref >39.00)
LDL Cholesterol: 65 mg/dL (ref 0–99)
NonHDL: 86.15
Total CHOL/HDL Ratio: 2
Triglycerides: 106 mg/dL (ref 0.0–149.0)
VLDL: 21.2 mg/dL (ref 0.0–40.0)

## 2023-04-21 LAB — BASIC METABOLIC PANEL
BUN: 14 mg/dL (ref 6–23)
CO2: 29 meq/L (ref 19–32)
Calcium: 9.3 mg/dL (ref 8.4–10.5)
Chloride: 101 meq/L (ref 96–112)
Creatinine, Ser: 0.83 mg/dL (ref 0.40–1.50)
GFR: 87.38 mL/min (ref >60.00)
Glucose, Bld: 88 mg/dL (ref 70–99)
Potassium: 4.4 meq/L (ref 3.5–5.1)
Sodium: 137 meq/L (ref 135–145)

## 2023-04-21 LAB — TSH: TSH: 1.01 u[IU]/mL (ref 0.35–5.50)

## 2023-04-21 LAB — PSA, MEDICARE: PSA: 2.82 ng/mL (ref 0.10–4.00)

## 2023-04-26 ENCOUNTER — Ambulatory Visit: Payer: Medicare PPO | Admitting: Internal Medicine

## 2023-04-26 VITALS — BP 126/70 | HR 83 | Temp 98.0°F | Resp 16 | Ht 72.0 in | Wt 148.0 lb

## 2023-04-26 DIAGNOSIS — Z87898 Personal history of other specified conditions: Secondary | ICD-10-CM | POA: Diagnosis not present

## 2023-04-26 DIAGNOSIS — E7800 Pure hypercholesterolemia, unspecified: Secondary | ICD-10-CM

## 2023-04-26 DIAGNOSIS — R7989 Other specified abnormal findings of blood chemistry: Secondary | ICD-10-CM | POA: Diagnosis not present

## 2023-04-26 DIAGNOSIS — E871 Hypo-osmolality and hyponatremia: Secondary | ICD-10-CM

## 2023-04-26 DIAGNOSIS — R0789 Other chest pain: Secondary | ICD-10-CM

## 2023-04-26 DIAGNOSIS — K219 Gastro-esophageal reflux disease without esophagitis: Secondary | ICD-10-CM

## 2023-04-26 DIAGNOSIS — F439 Reaction to severe stress, unspecified: Secondary | ICD-10-CM

## 2023-04-26 DIAGNOSIS — I10 Essential (primary) hypertension: Secondary | ICD-10-CM

## 2023-04-26 DIAGNOSIS — G4733 Obstructive sleep apnea (adult) (pediatric): Secondary | ICD-10-CM

## 2023-04-26 MED ORDER — AMLODIPINE BESYLATE 2.5 MG PO TABS: ORAL_TABLET | ORAL | 1 refills | Status: DC

## 2023-04-26 MED ORDER — SERTRALINE HCL 50 MG PO TABS: ORAL_TABLET | ORAL | 2 refills | Status: DC

## 2023-04-26 NOTE — Progress Notes (Unsigned)
 Subjective:    Patient ID: Scott Cohen, male    DOB: April 01, 1950, 73 y.o.   MRN: [id number]  Patient here for  Chief Complaint  Patient presents with   Medical Management of Chronic Issues    HPI Here for a scheduled follow up -  follow up regarding hypercholesterolemia and hypertension. Continues on amlodipine and losartan.  Had f/u with neurology 03/20/23. To review previous w/up - MRI Brain with and without contrast 05/20/2022 - No evidence of an acute intracranial abnormality. Punctate chronic microhemorrhage within the right parietal lobe. 3. Otherwise unremarkable MRI appearance of the brain for age. Sleep deprived EEG 06/11/2022 - This is a normal awake and sleep record, without EEG evidence for focality or epileptiform discharges. Prolonged EEG 09/15/2022 - This is a normal in-office unmonitored EEG study lasting 2 hr 28 min. No typical spell was recorded during this study. No ictal or interictal epileptiform activity was noted.  Did not tolerate trial of lamictal. Off. Concern regarding possible seizure disorder. On no medications. Concern regarding mild sleep apnea. Recommended retest for sleep apnea in June. Does report had an episode 1-2 weeks ago. Was standing in the kitchen. Felt like was going to pass out. Notified his wife and laid down on the floor. Tried to do things to elevate his blood pressure. After lying for a brief period, symptoms resolved. No LOC. Has been occurring when standing. Can sense when going to occur. Resolves with lying down. Discussed extensive w/up previously. Discussed further reevaluation and f/u with cardiology - monitor, etc. Wants to hold on further testing/evaluation. Does report left upper anterior chest pain. Is reproducible on exam with palpation. No sob. No pain now. Does have remote arm/shoulder injury. Some aggravation with certain activities. Eating. No abdominal pain. Bowels moving.    Past Medical History:  Diagnosis Date   Hypercholesterolemia     Hypertension    Leukopenia    benign cyclical   Past Surgical History:  Procedure Laterality Date   WISDOM TOOTH EXTRACTION     Family History  Problem Relation Age of Onset   Heart disease Mother        pacemaker   Diabetes Mother    Heart disease Father        myocardial infarction   Hypertension Father    Diabetes Father    CVA Father    Colon cancer Neg Hx    Prostate cancer Neg Hx    Social History   Socioeconomic History   Marital status: Married    Spouse name: Not on file   Number of children: Not on file   Years of education: Not on file   Highest education level: Bachelor's degree (e.g., BA, AB, BS)  Occupational History    Employer: [employer]  Tobacco Use   Smoking status: Never   Smokeless tobacco: Never  Vaping Use   Vaping status: Never Used  Substance and Sexual Activity   Alcohol use: Yes    Alcohol/week: 0.0 standard drinks of alcohol    Comment: 2-3 beers daily   Drug use: No   Sexual activity: Not on file  Other Topics Concern   Not on file  Social History Narrative   Not on file   Social Drivers of Health   Financial Resource Strain: Patient Declined (04/23/2023)   Overall Financial Resource Strain (CARDIA)    Difficulty of Paying Living Expenses: Patient declined  Food Insecurity: Patient Declined (04/23/2023)   Hunger Vital Sign    Worried  About Running Out of Food in the Last Year: Patient declined    Ran Out of Food in the Last Year: Patient declined  Transportation Needs: Unknown (04/23/2023)   PRAPARE - Transportation    Lack of Transportation (Medical): No    Lack of Transportation (Non-Medical): Patient declined  Physical Activity: Unknown (04/23/2023)   Exercise Vital Sign    Days of Exercise per Week: Patient declined    Minutes of Exercise per Session: 30 min  Stress: Patient Declined (04/23/2023)   [employer] of Occupational Health - Occupational Stress Questionnaire    Feeling of Stress : Patient declined   Social Connections: Unknown (04/23/2023)   Social Connection and Isolation Panel [NHANES]    Frequency of Communication with Friends and Family: Patient declined    Frequency of Social Gatherings with Friends and Family: Patient declined    Attends Religious Services: Patient declined    Database administrator or Organizations: Patient declined    Attends Engineer, structural: More than 4 times per year    Marital Status: Patient declined     Review of Systems  Constitutional:  Negative for appetite change and unexpected weight change.  HENT:  Negative for congestion and sinus pressure.   Respiratory:  Negative for cough, chest tightness and shortness of breath.   Cardiovascular:  Negative for chest pain, palpitations and leg swelling.  Gastrointestinal:  Negative for abdominal pain, diarrhea, nausea and vomiting.  Genitourinary:  Negative for difficulty urinating and dysuria.  Musculoskeletal:  Negative for joint swelling and myalgias.  Skin:  Negative for color change and rash.  Neurological:  Negative for dizziness and headaches.  Psychiatric/Behavioral:  Negative for agitation and dysphoric mood.        Objective:     BP 126/70   Pulse 83   Temp 98 F (36.7 C)   Resp 16   Ht 6' (1.829 m)   Wt 148 lb (67.1 kg)   SpO2 98%   BMI 20.07 kg/m  Wt Readings from Last 3 Encounters:  04/26/23 148 lb (67.1 kg)  12/23/22 146 lb (66.2 kg)  09/20/22 151 lb 6.4 oz (68.7 kg)    Physical Exam Vitals reviewed.  Constitutional:      General: He is not in acute distress.    Appearance: Normal appearance. He is well-developed.  HENT:     Head: Normocephalic and atraumatic.     Right Ear: External ear normal.     Left Ear: External ear normal.     Mouth/Throat:     Pharynx: No oropharyngeal exudate or posterior oropharyngeal erythema.  Eyes:     General: No scleral icterus.       Right eye: No discharge.        Left eye: No discharge.     Conjunctiva/sclera:  Conjunctivae normal.  Cardiovascular:     Rate and Rhythm: Normal rate and regular rhythm.  Pulmonary:     Effort: Pulmonary effort is normal. No respiratory distress.     Breath sounds: Normal breath sounds.  Abdominal:     General: Bowel sounds are normal.     Palpations: Abdomen is soft.     Tenderness: There is no abdominal tenderness.  Musculoskeletal:        General: No swelling or tenderness.     Cervical back: Neck supple. No tenderness.  Lymphadenopathy:     Cervical: No cervical adenopathy.  Skin:    Findings: No erythema or rash.  Neurological:  Mental Status: He is alert.  Psychiatric:        Mood and Affect: Mood normal.        Behavior: Behavior normal.         Outpatient Encounter Medications as of 04/26/2023  Medication Sig   sertraline (ZOLOFT) 50 MG tablet 1/2 - 1 tablet q day   amLODipine (NORVASC) 2.5 MG tablet TAKE 1 TABLET(2.5 MG) BY MOUTH DAILY   aspirin EC 81 MG tablet Take 81 mg by mouth daily.   Cholecalciferol (VITAMIN D-1000 MAX ST) 25 MCG (1000 UT) tablet Take 2,000 Units by mouth 2 (two) times daily.   doxylamine, Sleep, (UNISOM) 25 MG tablet Take 25 mg by mouth at bedtime as needed.   EPINEPHrine (EPIPEN 2-PAK) 0.3 mg/0.3 mL IJ SOAJ injection Inject 0.3 mg into the muscle as needed for anaphylaxis.   famotidine (PEPCID) 20 MG tablet Take 20 mg by mouth daily.   losartan (COZAAR) 100 MG tablet Take 1 tablet (100 mg total) by mouth daily.   pantoprazole (PROTONIX) 40 MG tablet TAKE 1 TABLET BY MOUTH EVERY DAY   rosuvastatin (CRESTOR) 10 MG tablet Take 1 tablet (10 mg total) by mouth daily.   [DISCONTINUED] amLODipine (NORVASC) 2.5 MG tablet TAKE 1 TABLET(2.5 MG) BY MOUTH DAILY   No facility-administered encounter medications on file as of 04/26/2023.     Lab Results  Component Value Date   WBC 3.2 (L) 04/21/2023   HGB 13.4 04/21/2023   HCT 39.2 04/21/2023   PLT 239.0 04/21/2023   GLUCOSE 88 04/21/2023   CHOL 173 04/21/2023   TRIG  106.0 04/21/2023   HDL 87.00 04/21/2023   LDLDIRECT 144.2 08/22/2012   LDLCALC 65 04/21/2023   ALT 22 04/21/2023   AST 24 04/21/2023   NA 137 04/21/2023   K 4.4 04/21/2023   CL 101 04/21/2023   CREATININE 0.83 04/21/2023   BUN 14 04/21/2023   CO2 29 04/21/2023   TSH 1.01 04/21/2023   PSA 2.82 04/21/2023    MR BRAIN W WO CONTRAST Result Date: 05/23/2022 CLINICAL DATA:  Provided history: Syncope, unspecified syncope type. EXAM: MRI HEAD WITHOUT AND WITH CONTRAST TECHNIQUE: Multiplanar, multiecho pulse sequences of the brain and surrounding structures were obtained without and with intravenous contrast. CONTRAST:  6mL GADAVIST GADOBUTROL 1 MMOL/ML IV SOLN COMPARISON:  No pertinent prior exams available for comparison. FINDINGS: Brain: No age advanced or lobar predominant parenchymal atrophy. Punctate chronic microhemorrhage within the right parietal lobe. Prominent perivascular spaces within the inferior left basal ganglia. No cortical encephalomalacia is identified. No significant cerebral white matter disease for age. There is no acute infarct. No evidence of an intracranial mass. No extra-axial fluid collection. No midline shift. No pathologic intracranial enhancement identified. Vascular: Maintained flow voids within the proximal large arterial vessels. Skull and upper cervical spine: No focal suspicious marrow lesion. Sinuses/Orbits: No mass or acute finding within the imaged orbits. No significant paranasal sinus disease. IMPRESSION: 1. No evidence of an acute intracranial abnormality. 2. Punctate chronic microhemorrhage within the right parietal lobe. 3. Otherwise unremarkable MRI appearance of the brain for age. Electronically Signed   By: Jackey Loge D.O.   On: 05/23/2022 18:20       Assessment & Plan:  H/O syncope Assessment & Plan: Had f/u with neurology 03/20/23. To review previous w/up - MRI Brain with and without contrast 05/20/2022 - No evidence of an acute intracranial  abnormality. Punctate chronic microhemorrhage within the right parietal lobe. 3. Otherwise unremarkable MRI appearance of  the brain for age. Sleep deprived EEG 06/11/2022 - This is a normal awake and sleep record, without EEG evidence for focality or epileptiform discharges. Prolonged EEG 09/15/2022 - This is a normal in-office unmonitored EEG study lasting 2 hr 28 min. No typical spell was recorded during this study. No ictal or interictal epileptiform activity was noted.  Did not tolerate trial of lamictal. Off. Concern regarding possible seizure disorder. On no medications. Concern regarding mild sleep apnea. Recommended retest for sleep apnea in June. Had the recent episode as outlined. Lying down - resolved. Discussed further w/up and evaluation as outlined. Wants to hold on any further w/up at this time. Follow. Check blood pressure when occurs.   Orders: -     EKG 12-Lead  Pure hypercholesterolemia Assessment & Plan: On crestor.  Low cholesterol diet and exercise.  Follow lipid panel and liver function tests.  Recent check reviewed.  Lab Results  Component Value Date   CHOL 173 04/21/2023   HDL 87.00 04/21/2023   LDLCALC 65 04/21/2023   LDLDIRECT 144.2 08/22/2012   TRIG 106.0 04/21/2023   CHOLHDL 2 04/21/2023     Orders: -     Basic metabolic panel with GFR; Future -     Lipid panel; Future -     Hepatic function panel; Future  Abnormal liver function tests Assessment & Plan: Recent liver panel wnl. Avoid increased alcohol intake. Discussed today. Follow liver function tests.    Essential (primary) hypertension Assessment & Plan: Continue amlodipine and losartan.  Follow pressures. Blood pressure doing well. No changes today.    Gastroesophageal reflux disease, unspecified whether esophagitis present Assessment & Plan: No upper symptoms reported. Continue protonix.    Hyponatremia Assessment & Plan: Sodium level wnl recent check. Discussed the need to avoid increased  alcohol intake. Follow.    Left-sided chest wall pain Assessment & Plan: Intermittent flares. Aggravated by palpation on the area.  No known injury or trauma.  Appears to be more msk in origin. Given recent episode and pain, EKG - SR with no acute ischemic changes noted. No pain today. Follow.     OSA (obstructive sleep apnea) Assessment & Plan: Plan for repeat sleep study this summer.    Stress Assessment & Plan: Overall appears to be handling things well. Follow.    Other orders -     amLODIPine Besylate; TAKE 1 TABLET(2.5 MG) BY MOUTH DAILY  Dispense: 90 tablet; Refill: 1 -     Sertraline HCl; 1/2 - 1 tablet q day  Dispense: 30 tablet; Refill: 2     Dale Milford, MD

## 2023-04-27 ENCOUNTER — Encounter: Payer: Self-pay | Admitting: Internal Medicine

## 2023-04-27 NOTE — Assessment & Plan Note (Signed)
 On crestor.  Low cholesterol diet and exercise.  Follow lipid panel and liver function tests.  Recent check reviewed.  Lab Results  Component Value Date   CHOL 173 04/21/2023   HDL 87.00 04/21/2023   LDLCALC 65 04/21/2023   LDLDIRECT 144.2 08/22/2012   TRIG 106.0 04/21/2023   CHOLHDL 2 04/21/2023

## 2023-04-27 NOTE — Assessment & Plan Note (Signed)
 Had f/u with neurology 03/20/23. To review previous w/up - MRI Brain with and without contrast 05/20/2022 - No evidence of an acute intracranial abnormality. Punctate chronic microhemorrhage within the right parietal lobe. 3. Otherwise unremarkable MRI appearance of the brain for age. Sleep deprived EEG 06/11/2022 - This is a normal awake and sleep record, without EEG evidence for focality or epileptiform discharges. Prolonged EEG 09/15/2022 - This is a normal in-office unmonitored EEG study lasting 2 hr 28 min. No typical spell was recorded during this study. No ictal or interictal epileptiform activity was noted.  Did not tolerate trial of lamictal. Off. Concern regarding possible seizure disorder. On no medications. Concern regarding mild sleep apnea. Recommended retest for sleep apnea in June. Had the recent episode as outlined. Lying down - resolved. Discussed further w/up and evaluation as outlined. Wants to hold on any further w/up at this time. Follow. Check blood pressure when occurs.

## 2023-04-27 NOTE — Assessment & Plan Note (Signed)
 Intermittent flares. Aggravated by palpation on the area.  No known injury or trauma.  Appears to be more msk in origin. Given recent episode and pain, EKG - SR with no acute ischemic changes noted. No pain today. Follow.

## 2023-04-27 NOTE — Assessment & Plan Note (Signed)
 Plan for repeat sleep study this summer.

## 2023-04-27 NOTE — Assessment & Plan Note (Signed)
 Overall appears to be handling things well.  Follow.  ?

## 2023-04-27 NOTE — Assessment & Plan Note (Signed)
 Sodium level wnl recent check. Discussed the need to avoid increased alcohol intake. Follow.

## 2023-04-27 NOTE — Assessment & Plan Note (Signed)
 Continue amlodipine and losartan.  Follow pressures. Blood pressure doing well. No changes today.

## 2023-04-27 NOTE — Assessment & Plan Note (Signed)
 No upper symptoms reported.  Continue protonix.

## 2023-04-27 NOTE — Assessment & Plan Note (Signed)
 Recent liver panel wnl. Avoid increased alcohol intake. Discussed today. Follow liver function tests.

## 2023-05-08 ENCOUNTER — Encounter: Payer: Self-pay | Admitting: Internal Medicine

## 2023-05-08 NOTE — Telephone Encounter (Signed)
 Please call and confirm doing ok. I can move his appt earlier if wants to discuss other treatment options.

## 2023-05-08 NOTE — Telephone Encounter (Signed)
 Called pt. Confirmed doing ok. Pt has a lab appt on 4/23. He is ok with moving his appt up until that date. Will follow up to see what repeat labs are needed.

## 2023-05-09 NOTE — Telephone Encounter (Signed)
 Patient is coming in to see Dr Lorin Picket on 4/23 to discuss other treatment options. Will repeat CBC

## 2023-05-23 DIAGNOSIS — H2513 Age-related nuclear cataract, bilateral: Secondary | ICD-10-CM | POA: Diagnosis not present

## 2023-05-24 ENCOUNTER — Ambulatory Visit: Admitting: Internal Medicine

## 2023-05-24 ENCOUNTER — Other Ambulatory Visit

## 2023-05-24 ENCOUNTER — Encounter: Payer: Self-pay | Admitting: Internal Medicine

## 2023-05-24 VITALS — BP 136/76 | HR 79 | Temp 98.2°F | Resp 16 | Ht 72.0 in | Wt 147.0 lb

## 2023-05-24 DIAGNOSIS — R7989 Other specified abnormal findings of blood chemistry: Secondary | ICD-10-CM | POA: Diagnosis not present

## 2023-05-24 DIAGNOSIS — F439 Reaction to severe stress, unspecified: Secondary | ICD-10-CM | POA: Diagnosis not present

## 2023-05-24 DIAGNOSIS — I10 Essential (primary) hypertension: Secondary | ICD-10-CM | POA: Diagnosis not present

## 2023-05-24 DIAGNOSIS — D72819 Decreased white blood cell count, unspecified: Secondary | ICD-10-CM

## 2023-05-24 LAB — CBC WITH DIFFERENTIAL/PLATELET
Basophils Absolute: 0 10*3/uL (ref 0.0–0.1)
Basophils Relative: 1.2 % (ref 0.0–3.0)
Eosinophils Absolute: 0 10*3/uL (ref 0.0–0.7)
Eosinophils Relative: 1.1 % (ref 0.0–5.0)
HCT: 37 % — ABNORMAL LOW (ref 39.0–52.0)
Hemoglobin: 12.8 g/dL — ABNORMAL LOW (ref 13.0–17.0)
Lymphocytes Relative: 43.7 % (ref 12.0–46.0)
Lymphs Abs: 1.6 10*3/uL (ref 0.7–4.0)
MCHC: 34.5 g/dL (ref 30.0–36.0)
MCV: 92.3 fl (ref 78.0–100.0)
Monocytes Absolute: 0.5 10*3/uL (ref 0.1–1.0)
Monocytes Relative: 13.9 % — ABNORMAL HIGH (ref 3.0–12.0)
Neutro Abs: 1.5 10*3/uL (ref 1.4–7.7)
Neutrophils Relative %: 40.1 % — ABNORMAL LOW (ref 43.0–77.0)
Platelets: 253 10*3/uL (ref 150.0–400.0)
RBC: 4.01 Mil/uL — ABNORMAL LOW (ref 4.22–5.81)
RDW: 12.2 % (ref 11.5–15.5)
WBC: 3.8 10*3/uL — ABNORMAL LOW (ref 4.0–10.5)

## 2023-05-24 MED ORDER — BUSPIRONE HCL 5 MG PO TABS: 5.0000 mg | ORAL_TABLET | Freq: Every day | ORAL | 1 refills | Status: DC

## 2023-05-24 NOTE — Assessment & Plan Note (Signed)
 Continue amlodipine  and losartan .  Follow pressures. Blood pressure as outlined. No changes in medication today.

## 2023-05-24 NOTE — Assessment & Plan Note (Signed)
 Discussed. Did not tolerate zoloft . Would like trial of a different medication. Trial of buspar . Follow.  Call with update.

## 2023-05-24 NOTE — Progress Notes (Signed)
 Subjective:    Patient ID: Scott Cohen, male    DOB: 18-Jan-1951, 73 y.o.   MRN: [id number]  Patient here for  Chief Complaint  Patient presents with   Anxiety    HPI Here for work in appt. Was started on zoloft  last visit. Started with 25mg  per day. Did not tolerate. Did try to go up to 50mg , but noticed some uneasiness and nervous anxiety. Stopped the medication. Symptoms resolved. Would like to try a different medication. Increased stress. Stays active. No chest pain. Breathing stable. No further "spells".    Past Medical History:  Diagnosis Date   Hypercholesterolemia    Hypertension    Leukopenia    benign cyclical   Past Surgical History:  Procedure Laterality Date   WISDOM TOOTH EXTRACTION     Family History  Problem Relation Age of Onset   Heart disease Mother        pacemaker   Diabetes Mother    Heart disease Father        myocardial infarction   Hypertension Father    Diabetes Father    CVA Father    Colon cancer Neg Hx    Prostate cancer Neg Hx    Social History   Socioeconomic History   Marital status: Married    Spouse name: Not on file   Number of children: Not on file   Years of education: Not on file   Highest education level: Bachelor's degree (e.g., BA, AB, BS)  Occupational History    Employer: [employer]  Tobacco Use   Smoking status: Never   Smokeless tobacco: Never  Vaping Use   Vaping status: Never Used  Substance and Sexual Activity   Alcohol use: Yes    Alcohol/week: 0.0 standard drinks of alcohol    Comment: 2-3 beers daily   Drug use: No   Sexual activity: Not on file  Other Topics Concern   Not on file  Social History Narrative   Not on file   Social Drivers of Health   Financial Resource Strain: Patient Declined (04/23/2023)   Overall Financial Resource Strain (CARDIA)    Difficulty of Paying Living Expenses: Patient declined  Food Insecurity: Patient Declined (04/23/2023)   Hunger Vital Sign    Worried About  Running Out of Food in the Last Year: Patient declined    Ran Out of Food in the Last Year: Patient declined  Transportation Needs: Unknown (04/23/2023)   PRAPARE - Administrator, Civil Service (Medical): No    Lack of Transportation (Non-Medical): Patient declined  Physical Activity: Unknown (04/23/2023)   Exercise Vital Sign    Days of Exercise per Week: Patient declined    Minutes of Exercise per Session: 30 min  Stress: Patient Declined (04/23/2023)   [employer] of Occupational Health - Occupational Stress Questionnaire    Feeling of Stress : Patient declined  Social Connections: Unknown (04/23/2023)   Social Connection and Isolation Panel [NHANES]    Frequency of Communication with Friends and Family: Patient declined    Frequency of Social Gatherings with Friends and Family: Patient declined    Attends Religious Services: Patient declined    Database administrator or Organizations: Patient declined    Attends Engineer, structural: More than 4 times per year    Marital Status: Patient declined     Review of Systems  Constitutional:  Negative for appetite change and unexpected weight change.  HENT:  Negative for congestion and  sinus pressure.   Respiratory:  Negative for cough, chest tightness and shortness of breath.   Cardiovascular:  Negative for chest pain, palpitations and leg swelling.  Gastrointestinal:  Negative for abdominal pain, diarrhea, nausea and vomiting.  Genitourinary:  Negative for difficulty urinating and dysuria.  Musculoskeletal:  Negative for joint swelling and myalgias.  Skin:  Negative for color change and rash.  Neurological:  Negative for dizziness and headaches.  Psychiatric/Behavioral:  Negative for agitation and dysphoric mood.        Objective:     BP 136/76   Pulse 79   Temp 98.2 F (36.8 C)   Resp 16   Ht 6' (1.829 m)   Wt 147 lb (66.7 kg)   SpO2 98%   BMI 19.94 kg/m  Wt Readings from Last 3 Encounters:   05/24/23 147 lb (66.7 kg)  04/26/23 148 lb (67.1 kg)  12/23/22 146 lb (66.2 kg)    Physical Exam Vitals reviewed.  Constitutional:      General: He is not in acute distress.    Appearance: Normal appearance. He is well-developed.  HENT:     Head: Normocephalic and atraumatic.     Right Ear: External ear normal.     Left Ear: External ear normal.     Mouth/Throat:     Pharynx: No oropharyngeal exudate or posterior oropharyngeal erythema.  Eyes:     General: No scleral icterus.       Right eye: No discharge.        Left eye: No discharge.     Conjunctiva/sclera: Conjunctivae normal.  Cardiovascular:     Rate and Rhythm: Normal rate and regular rhythm.  Pulmonary:     Effort: Pulmonary effort is normal. No respiratory distress.     Breath sounds: Normal breath sounds.  Abdominal:     General: Bowel sounds are normal.     Palpations: Abdomen is soft.     Tenderness: There is no abdominal tenderness.  Musculoskeletal:        General: No swelling or tenderness.     Cervical back: Neck supple. No tenderness.  Lymphadenopathy:     Cervical: No cervical adenopathy.  Skin:    Findings: No erythema or rash.  Neurological:     Mental Status: He is alert.  Psychiatric:        Mood and Affect: Mood normal.        Behavior: Behavior normal.         Outpatient Encounter Medications as of 05/24/2023  Medication Sig   busPIRone  (BUSPAR ) 5 MG tablet Take 1 tablet (5 mg total) by mouth daily.   amLODipine  (NORVASC ) 2.5 MG tablet TAKE 1 TABLET(2.5 MG) BY MOUTH DAILY   aspirin EC 81 MG tablet Take 81 mg by mouth daily.   Cholecalciferol (VITAMIN D -1000 MAX ST) 25 MCG (1000 UT) tablet Take 2,000 Units by mouth 2 (two) times daily.   doxylamine, Sleep, (UNISOM) 25 MG tablet Take 25 mg by mouth at bedtime as needed.   EPINEPHrine  (EPIPEN  2-PAK) 0.3 mg/0.3 mL IJ SOAJ injection Inject 0.3 mg into the muscle as needed for anaphylaxis.   famotidine (PEPCID) 20 MG tablet Take 20 mg by  mouth daily.   losartan  (COZAAR ) 100 MG tablet Take 1 tablet (100 mg total) by mouth daily.   pantoprazole  (PROTONIX ) 40 MG tablet TAKE 1 TABLET BY MOUTH EVERY DAY   rosuvastatin  (CRESTOR ) 10 MG tablet Take 1 tablet (10 mg total) by mouth daily.   [DISCONTINUED] sertraline  (  ZOLOFT ) 50 MG tablet 1/2 - 1 tablet q day   No facility-administered encounter medications on file as of 05/24/2023.     Lab Results  Component Value Date   WBC 3.2 (L) 04/21/2023   HGB 13.4 04/21/2023   HCT 39.2 04/21/2023   PLT 239.0 04/21/2023   GLUCOSE 88 04/21/2023   CHOL 173 04/21/2023   TRIG 106.0 04/21/2023   HDL 87.00 04/21/2023   LDLDIRECT 144.2 08/22/2012   LDLCALC 65 04/21/2023   ALT 22 04/21/2023   AST 24 04/21/2023   NA 137 04/21/2023   K 4.4 04/21/2023   CL 101 04/21/2023   CREATININE 0.83 04/21/2023   BUN 14 04/21/2023   CO2 29 04/21/2023   TSH 1.01 04/21/2023   PSA 2.82 04/21/2023    MR BRAIN W WO CONTRAST Result Date: 05/23/2022 CLINICAL DATA:  Provided history: Syncope, unspecified syncope type. EXAM: MRI HEAD WITHOUT AND WITH CONTRAST TECHNIQUE: Multiplanar, multiecho pulse sequences of the brain and surrounding structures were obtained without and with intravenous contrast. CONTRAST:  6mL GADAVIST  GADOBUTROL  1 MMOL/ML IV SOLN COMPARISON:  No pertinent prior exams available for comparison. FINDINGS: Brain: No age advanced or lobar predominant parenchymal atrophy. Punctate chronic microhemorrhage within the right parietal lobe. Prominent perivascular spaces within the inferior left basal ganglia. No cortical encephalomalacia is identified. No significant cerebral white matter disease for age. There is no acute infarct. No evidence of an intracranial mass. No extra-axial fluid collection. No midline shift. No pathologic intracranial enhancement identified. Vascular: Maintained flow voids within the proximal large arterial vessels. Skull and upper cervical spine: No focal suspicious marrow lesion.  Sinuses/Orbits: No mass or acute finding within the imaged orbits. No significant paranasal sinus disease. IMPRESSION: 1. No evidence of an acute intracranial abnormality. 2. Punctate chronic microhemorrhage within the right parietal lobe. 3. Otherwise unremarkable MRI appearance of the brain for age. Electronically Signed   By: Bascom Lily D.O.   On: 05/23/2022 18:20       Assessment & Plan:  Abnormal CBC -     CBC with Differential/Platelet  Stress Assessment & Plan: Discussed. Did not tolerate zoloft . Would like trial of a different medication. Trial of buspar . Follow.  Call with update.    Leukopenia, unspecified type Assessment & Plan: Recheck today to confirm stable.    Essential (primary) hypertension Assessment & Plan: Continue amlodipine  and losartan .  Follow pressures. Blood pressure as outlined. No changes in medication today.    Other orders -     busPIRone  HCl; Take 1 tablet (5 mg total) by mouth daily.  Dispense: 30 tablet; Refill: 1     Dellar Fenton, MD

## 2023-05-24 NOTE — Assessment & Plan Note (Signed)
Recheck today to confirm stable.

## 2023-05-30 ENCOUNTER — Ambulatory Visit (INDEPENDENT_AMBULATORY_CARE_PROVIDER_SITE_OTHER): Payer: Medicare PPO | Admitting: *Deleted

## 2023-05-30 VITALS — Ht 72.0 in | Wt 148.0 lb

## 2023-05-30 DIAGNOSIS — Z0000 Encounter for general adult medical examination without abnormal findings: Secondary | ICD-10-CM

## 2023-05-30 NOTE — Patient Instructions (Signed)
 Mr. Saunders , Thank you for taking time to come for your Medicare Wellness Visit. I appreciate your ongoing commitment to your health goals. Please review the following plan we discussed and let me know if I can assist you in the future.   Referrals/Orders/Follow-Ups/Clinician Recommendations: Remember to update your pneumonia vaccine.  This is a list of the screening recommended for you and due dates:  Health Maintenance  Topic Date Due   Pneumonia Vaccine (2 of 2 - PPSV23) 11/30/2019   COVID-19 Vaccine (5 - 2024-25 season) 06/26/2023   Flu Shot  09/01/2023   Medicare Annual Wellness Visit  05/29/2024   Colon Cancer Screening  01/06/2028   DTaP/Tdap/Td vaccine (2 - Td or Tdap) 05/17/2029   Hepatitis C Screening  Completed   Zoster (Shingles) Vaccine  Completed   HPV Vaccine  Aged Out   Meningitis B Vaccine  Aged Out    Advanced directives: (Declined) Advance directive discussed with you today. Even though you declined this today, please call our office should you change your mind, and we can give you the proper paperwork for you to fill out.  Patient stated that he will work on this.  Next Medicare Annual Wellness Visit scheduled for next year: Yes 06/04/24 @ 1:40

## 2023-05-30 NOTE — Progress Notes (Signed)
 Subjective:   Scott Cohen is a 73 y.o. who presents for a Medicare Wellness preventive visit.  Visit Complete: Virtual I connected with  Mathew Solomon on 05/30/23 by a audio enabled telemedicine application and verified that I am speaking with the correct person using two identifiers.  Patient Location: Home  Provider Location: Office/Clinic  I discussed the limitations of evaluation and management by telemedicine. The patient expressed understanding and agreed to proceed.  Vital Signs: Because this visit was a virtual/telehealth visit, some criteria may be missing or patient reported. Any vitals not documented were not able to be obtained and vitals that have been documented are patient reported.  VideoDeclined- This patient declined Librarian, academic. Therefore the visit was completed with audio only.  Persons Participating in Visit: Patient.  AWV Questionnaire: No: Patient Medicare AWV questionnaire was not completed prior to this visit.  Cardiac Risk Factors include: advanced age (>57men, >81 women);male gender;dyslipidemia;hypertension     Objective:    Today's Vitals   05/30/23 1340  Weight: 148 lb (67.1 kg)  Height: 6' (1.829 m)   Body mass index is 20.07 kg/m.     05/30/2023    2:00 PM 05/27/2022    2:08 PM 05/19/2021    9:34 AM 06/05/2019   12:57 PM 05/18/2019    2:40 PM 07/09/2015   11:56 AM  Advanced Directives  Does Patient Have a Medical Advance Directive? No No No No No No  Would patient like information on creating a medical advance directive? No - Patient declined No - Patient declined No - Patient declined No - Guardian declined  No - patient declined information    Current Medications (verified) Outpatient Encounter Medications as of 05/30/2023  Medication Sig   amLODipine  (NORVASC ) 2.5 MG tablet TAKE 1 TABLET(2.5 MG) BY MOUTH DAILY   aspirin EC 81 MG tablet Take 81 mg by mouth daily.   busPIRone  (BUSPAR ) 5 MG tablet Take 1  tablet (5 mg total) by mouth daily.   Cholecalciferol (VITAMIN D -1000 MAX ST) 25 MCG (1000 UT) tablet Take 2,000 Units by mouth 2 (two) times daily.   doxylamine, Sleep, (UNISOM) 25 MG tablet Take 25 mg by mouth at bedtime as needed.   EPINEPHrine  (EPIPEN  2-PAK) 0.3 mg/0.3 mL IJ SOAJ injection Inject 0.3 mg into the muscle as needed for anaphylaxis.   famotidine (PEPCID) 20 MG tablet Take 20 mg by mouth daily.   losartan  (COZAAR ) 100 MG tablet Take 1 tablet (100 mg total) by mouth daily.   pantoprazole  (PROTONIX ) 40 MG tablet TAKE 1 TABLET BY MOUTH EVERY DAY   rosuvastatin  (CRESTOR ) 10 MG tablet Take 1 tablet (10 mg total) by mouth daily.   No facility-administered encounter medications on file as of 05/30/2023.    Allergies (verified) Lamotrigine and Zoloft  [sertraline  hcl]   History: Past Medical History:  Diagnosis Date   Hypercholesterolemia    Hypertension    Leukopenia    benign cyclical   Past Surgical History:  Procedure Laterality Date   WISDOM TOOTH EXTRACTION     Family History  Problem Relation Age of Onset   Heart disease Mother        pacemaker   Diabetes Mother    Heart disease Father        myocardial infarction   Hypertension Father    Diabetes Father    CVA Father    Colon cancer Neg Hx    Prostate cancer Neg Hx    Social History  Socioeconomic History   Marital status: Married    Spouse name: Not on file   Number of children: Not on file   Years of education: Not on file   Highest education level: Bachelor's degree (e.g., BA, AB, BS)  Occupational History    Employer: [employer]  Tobacco Use   Smoking status: Never   Smokeless tobacco: Never  Vaping Use   Vaping status: Never Used  Substance and Sexual Activity   Alcohol use: Yes    Alcohol/week: 0.0 standard drinks of alcohol    Comment: 2-3 beers daily   Drug use: No   Sexual activity: Not on file  Other Topics Concern   Not on file  Social History Narrative   Not on file    Social Drivers of Health   Financial Resource Strain: Low Risk  (05/30/2023)   Overall Financial Resource Strain (CARDIA)    Difficulty of Paying Living Expenses: Not hard at all  Food Insecurity: No Food Insecurity (05/30/2023)   Hunger Vital Sign    Worried About Running Out of Food in the Last Year: Never true    Ran Out of Food in the Last Year: Never true  Transportation Needs: No Transportation Needs (05/30/2023)   PRAPARE - Administrator, Civil Service (Medical): No    Lack of Transportation (Non-Medical): No  Physical Activity: Inactive (05/30/2023)   Exercise Vital Sign    Days of Exercise per Week: 0 days    Minutes of Exercise per Session: 0 min  Stress: No Stress Concern Present (05/30/2023)   [employer] of Occupational Health - Occupational Stress Questionnaire    Feeling of Stress : Only a little  Social Connections: Socially Integrated (05/30/2023)   Social Connection and Isolation Panel [NHANES]    Frequency of Communication with Friends and Family: More than three times a week    Frequency of Social Gatherings with Friends and Family: Once a week    Attends Religious Services: More than 4 times per year    Active Member of [employer] or Organizations: Yes    Attends Engineer, structural: More than 4 times per year    Marital Status: Married    Tobacco Counseling Counseling given: Not Answered    Clinical Intake:  Pre-visit preparation completed: Yes  Pain : No/denies pain     BMI - recorded: 20.07 Nutritional Status: BMI of 19-24  Normal Nutritional Risks: None Diabetes: No  No results found for: "HGBA1C"   How often do you need to have someone help you when you read instructions, pamphlets, or other written materials from your doctor or pharmacy?: 1 - Never  Interpreter Needed?: No  Information entered by :: R. Jenipher Havel LPB   Activities of Daily Living     05/30/2023    1:41 PM  In your present state of health, do you have  any difficulty performing the following activities:  Hearing? 0  Vision? 0  Difficulty concentrating or making decisions? 0  Walking or climbing stairs? 0  Dressing or bathing? 0  Doing errands, shopping? 0  Preparing Food and eating ? N  Using the Toilet? N  In the past six months, have you accidently leaked urine? N  Do you have problems with loss of bowel control? N  Managing your Medications? N  Managing your Finances? N  Housekeeping or managing your Housekeeping? N    Patient Care Team: Dellar Fenton, MD as PCP - General (Internal Medicine)  Indicate any recent  Medical Services you may have received from other than [facility] providers in the past year (date may be approximate).     Assessment:   This is a routine wellness examination for [place].  Hearing/Vision screen Hearing Screening - Comments:: No issues Vision Screening - Comments:: glasses   Goals Addressed             This Visit's Progress    Patient Stated       Wants to be able to sleep better       Depression Screen     05/30/2023    1:55 PM 05/30/2023    1:52 PM 05/27/2022    2:08 PM 03/22/2022   10:44 AM 11/16/2021   11:18 AM 08/06/2021    2:31 PM 05/19/2021    9:40 AM  PHQ 2/9 Scores  PHQ - 2 Score 0 3 0 0 0 0 0  PHQ- 9 Score 3 6         Fall Risk     05/30/2023    1:44 PM 05/24/2022    6:59 AM 03/22/2022   10:44 AM 11/16/2021   11:18 AM 08/06/2021    2:31 PM  Fall Risk   Falls in the past year? 0 0 0 0 0  Number falls in past yr: 0 0 0 0 0  Injury with Fall? 0 0 0 0 0  Risk for fall due to : No Fall Risks  No Fall Risks No Fall Risks No Fall Risks  Follow up Falls prevention discussed;Falls evaluation completed Falls evaluation completed;Falls prevention discussed Falls evaluation completed Falls evaluation completed Falls evaluation completed    MEDICARE RISK AT HOME:  Medicare Risk at Home Any stairs in or around the home?: Yes If so, are there any without handrails?: No Home free of  loose throw rugs in walkways, pet beds, electrical cords, etc?: Yes Adequate lighting in your home to reduce risk of falls?: Yes Life alert?: No Use of a cane, walker or w/c?: No Grab bars in the bathroom?: Yes Shower chair or bench in shower?: Yes Elevated toilet seat or a handicapped toilet?: Yes  TIMED UP AND GO:  Was the test performed?  No  Cognitive Function: 6CIT completed        05/30/2023    2:01 PM 05/27/2022    2:09 PM  6CIT Screen  What Year? 0 points 0 points  What month? 0 points 0 points  What time? 0 points 0 points  Count back from 20 0 points 0 points  Months in reverse 0 points 0 points  Repeat phrase 0 points 0 points  Total Score 0 points 0 points    Immunizations Immunization History  Administered Date(s) Administered   Fluad Quad(high Dose 65+) 10/18/2018, 10/29/2019, 11/16/2021   Fluad Trivalent(High Dose 65+) 12/23/2022   Influenza Split 02/09/2012, 12/10/2012   Influenza, High Dose Seasonal PF 01/03/2017   Influenza,inj,Quad PF,6+ Mos 01/07/2015   Influenza-Unspecified 11/27/2017   MMR 12/07/2017   Moderna Covid-19 Fall Seasonal Vaccine 19yrs & older 12/27/2022   PFIZER(Purple Top)SARS-COV-2 Vaccination 03/18/2019, 04/08/2019, 01/14/2020   Pneumococcal Conjugate-13 11/30/2018   Tdap 05/18/2019   Zoster Recombinant(Shingrix) 12/15/2018, 12/27/2022    Screening Tests Health Maintenance  Topic Date Due   Pneumonia Vaccine 87+ Years old (2 of 2 - PPSV23) 11/30/2019   Medicare Annual Wellness (AWV)  05/27/2023   COVID-19 Vaccine (5 - 2024-25 season) 06/26/2023   INFLUENZA VACCINE  09/01/2023   Colonoscopy  01/06/2028   DTaP/Tdap/Td (2 -  Td or Tdap) 05/17/2029   Hepatitis C Screening  Completed   Zoster Vaccines- Shingrix  Completed   HPV VACCINES  Aged Out   Meningococcal B Vaccine  Aged Out    Health Maintenance  Health Maintenance Due  Topic Date Due   Pneumonia Vaccine 96+ Years old (2 of 2 - PPSV23) 11/30/2019   Medicare Annual  Wellness (AWV)  05/27/2023   Health Maintenance Items Addressed: Discussed the need to update pneumonia vaccine.  Additional Screening:  Vision Screening: Recommended annual ophthalmology exams for early detection of glaucoma and other disorders of the eye. Up to date  Dr. Murray Arnold  Dental Screening: Recommended annual dental exams for proper oral hygiene  Community Resource Referral / Chronic Care Management: CRR required this visit?  No   CCM required this visit?  No     Plan:     I have personally reviewed and noted the following in the patient's chart:   Medical and social history Use of alcohol, tobacco or illicit drugs  Current medications and supplements including opioid prescriptions. Patient is not currently taking opioid prescriptions. Functional ability and status Nutritional status Physical activity Advanced directives List of other physicians Hospitalizations, surgeries, and ER visits in previous 12 months Vitals Screenings to include cognitive, depression, and falls Referrals and appointments  In addition, I have reviewed and discussed with patient certain preventive protocols, quality metrics, and best practice recommendations. A written personalized care plan for preventive services as well as general preventive health recommendations were provided to patient.     Felicitas Horse, LPN   02/19/1476   After Visit Summary: (MyChart) Due to this being a telephonic visit, the after visit summary with patients personalized plan was offered to patient via MyChart   Notes: Nothing significant to report at this time.

## 2023-06-08 ENCOUNTER — Encounter: Payer: Self-pay | Admitting: Internal Medicine

## 2023-06-08 ENCOUNTER — Ambulatory Visit: Admitting: Internal Medicine

## 2023-06-08 VITALS — BP 128/70 | HR 79 | Temp 98.0°F | Resp 16 | Ht 72.0 in | Wt 147.6 lb

## 2023-06-08 DIAGNOSIS — F439 Reaction to severe stress, unspecified: Secondary | ICD-10-CM | POA: Diagnosis not present

## 2023-06-08 DIAGNOSIS — D649 Anemia, unspecified: Secondary | ICD-10-CM | POA: Diagnosis not present

## 2023-06-08 DIAGNOSIS — E7800 Pure hypercholesterolemia, unspecified: Secondary | ICD-10-CM | POA: Diagnosis not present

## 2023-06-08 DIAGNOSIS — D72819 Decreased white blood cell count, unspecified: Secondary | ICD-10-CM | POA: Diagnosis not present

## 2023-06-08 DIAGNOSIS — E559 Vitamin D deficiency, unspecified: Secondary | ICD-10-CM | POA: Diagnosis not present

## 2023-06-08 DIAGNOSIS — I10 Essential (primary) hypertension: Secondary | ICD-10-CM | POA: Diagnosis not present

## 2023-06-08 LAB — IBC + FERRITIN
Ferritin: 113.3 ng/mL (ref 22.0–322.0)
Iron: 94 ug/dL (ref 42–165)
Saturation Ratios: 22.2 % (ref 20.0–50.0)
TIBC: 422.8 ug/dL (ref 250.0–450.0)
Transferrin: 302 mg/dL (ref 212.0–360.0)

## 2023-06-08 LAB — CBC WITH DIFFERENTIAL/PLATELET
Basophils Absolute: 0 10*3/uL (ref 0.0–0.1)
Basophils Relative: 1.1 % (ref 0.0–3.0)
Eosinophils Absolute: 0 10*3/uL (ref 0.0–0.7)
Eosinophils Relative: 1.2 % (ref 0.0–5.0)
HCT: 40.2 % (ref 39.0–52.0)
Hemoglobin: 13.9 g/dL (ref 13.0–17.0)
Lymphocytes Relative: 37.4 % (ref 12.0–46.0)
Lymphs Abs: 1.4 10*3/uL (ref 0.7–4.0)
MCHC: 34.5 g/dL (ref 30.0–36.0)
MCV: 93.8 fl (ref 78.0–100.0)
Monocytes Absolute: 0.5 10*3/uL (ref 0.1–1.0)
Monocytes Relative: 12.1 % — ABNORMAL HIGH (ref 3.0–12.0)
Neutro Abs: 1.9 10*3/uL (ref 1.4–7.7)
Neutrophils Relative %: 48.2 % (ref 43.0–77.0)
Platelets: 251 10*3/uL (ref 150.0–400.0)
RBC: 4.29 Mil/uL (ref 4.22–5.81)
RDW: 12.5 % (ref 11.5–15.5)
WBC: 3.9 10*3/uL — ABNORMAL LOW (ref 4.0–10.5)

## 2023-06-08 LAB — VITAMIN B12: Vitamin B-12: 211 pg/mL (ref 211–911)

## 2023-06-08 LAB — VITAMIN D 25 HYDROXY (VIT D DEFICIENCY, FRACTURES): VITD: 42.85 ng/mL (ref 30.00–100.00)

## 2023-06-08 MED ORDER — BUSPIRONE HCL 5 MG PO TABS: 5.0000 mg | ORAL_TABLET | Freq: Two times a day (BID) | ORAL | 2 refills | Status: DC | PRN

## 2023-06-08 NOTE — Assessment & Plan Note (Signed)
 Recent hgb decreased.  Recheck cbc today. Also check B12 and iron studies.

## 2023-06-08 NOTE — Assessment & Plan Note (Signed)
 Recently started on buspar . Does feel better. Sleep issues as outlined. Will have him take 5mg  buspar  q hs.  1/2 tablet in afternoon. Call with update. Follow. Overall doing better.

## 2023-06-08 NOTE — Progress Notes (Signed)
 Subjective:    Patient ID: Scott Cohen, male    DOB: 11-13-1950, 73 y.o.   MRN: [id number]  Patient here for  Chief Complaint  Patient presents with   Medical Management of Chronic Issues    HPI Here for a scheduled follow up - follow up regarding hypercholesterolemia, hypertension and increased stress. Previously started on zoloft . Did not tolerate. Last visit, decided to try buspar . Appears to be doing well with buspar . Is adjusting the dose. Currently taking 1/2 tablet around 2 pm and 1/2 tablet around 6pm and 1/2 tablet before bed. Is helping. Having no problems falling asleep, but is having some issues staying asleep. Discussed adjusting the dose. Feels like the medication is helping to level things out. No further "spells". Discussed recent labs and f/u labs today    Past Medical History:  Diagnosis Date   Hypercholesterolemia    Hypertension    Leukopenia    benign cyclical   Past Surgical History:  Procedure Laterality Date   WISDOM TOOTH EXTRACTION     Family History  Problem Relation Age of Onset   Heart disease Mother        pacemaker   Diabetes Mother    Heart disease Father        myocardial infarction   Hypertension Father    Diabetes Father    CVA Father    Colon cancer Neg Hx    Prostate cancer Neg Hx    Social History   Socioeconomic History   Marital status: Married    Spouse name: Not on file   Number of children: Not on file   Years of education: Not on file   Highest education level: Bachelor's degree (e.g., BA, AB, BS)  Occupational History    Employer: [employer]  Tobacco Use   Smoking status: Never   Smokeless tobacco: Never  Vaping Use   Vaping status: Never Used  Substance and Sexual Activity   Alcohol use: Yes    Alcohol/week: 0.0 standard drinks of alcohol    Comment: 2-3 beers daily   Drug use: No   Sexual activity: Not on file  Other Topics Concern   Not on file  Social History Narrative   Not on file   Social  Drivers of Health   Financial Resource Strain: Low Risk  (05/30/2023)   Overall Financial Resource Strain (CARDIA)    Difficulty of Paying Living Expenses: Not hard at all  Food Insecurity: No Food Insecurity (05/30/2023)   Hunger Vital Sign    Worried About Running Out of Food in the Last Year: Never true    Ran Out of Food in the Last Year: Never true  Transportation Needs: No Transportation Needs (05/30/2023)   PRAPARE - Administrator, Civil Service (Medical): No    Lack of Transportation (Non-Medical): No  Physical Activity: Inactive (05/30/2023)   Exercise Vital Sign    Days of Exercise per Week: 0 days    Minutes of Exercise per Session: 0 min  Stress: No Stress Concern Present (05/30/2023)   [employer] of Occupational Health - Occupational Stress Questionnaire    Feeling of Stress : Only a little  Social Connections: Socially Integrated (05/30/2023)   Social Connection and Isolation Panel [NHANES]    Frequency of Communication with Friends and Family: More than three times a week    Frequency of Social Gatherings with Friends and Family: Once a week    Attends Religious Services: More than 4 times  per year    Active Member of Clubs or Organizations: Yes    Attends Banker Meetings: More than 4 times per year    Marital Status: Married     Review of Systems  Constitutional:  Negative for appetite change and unexpected weight change.  HENT:  Negative for congestion and sinus pressure.   Respiratory:  Negative for cough, chest tightness and shortness of breath.   Cardiovascular:  Negative for chest pain, palpitations and leg swelling.  Gastrointestinal:  Negative for abdominal pain, diarrhea, nausea and vomiting.  Genitourinary:  Negative for difficulty urinating and dysuria.  Musculoskeletal:  Negative for joint swelling and myalgias.  Skin:  Negative for color change and rash.  Neurological:  Negative for dizziness and headaches.   Psychiatric/Behavioral:  Negative for agitation and dysphoric mood.        Objective:     BP 128/70   Pulse 79   Temp 98 F (36.7 C)   Resp 16   Ht 6' (1.829 m)   Wt 147 lb 9.6 oz (67 kg)   SpO2 98%   BMI 20.02 kg/m  Wt Readings from Last 3 Encounters:  06/08/23 147 lb 9.6 oz (67 kg)  05/30/23 148 lb (67.1 kg)  05/24/23 147 lb (66.7 kg)    Physical Exam Vitals reviewed.  Constitutional:      General: He is not in acute distress.    Appearance: Normal appearance. He is well-developed.  HENT:     Head: Normocephalic and atraumatic.     Right Ear: External ear normal.     Left Ear: External ear normal.     Mouth/Throat:     Pharynx: No oropharyngeal exudate or posterior oropharyngeal erythema.  Eyes:     General: No scleral icterus.       Right eye: No discharge.        Left eye: No discharge.     Conjunctiva/sclera: Conjunctivae normal.  Cardiovascular:     Rate and Rhythm: Normal rate and regular rhythm.  Pulmonary:     Effort: Pulmonary effort is normal. No respiratory distress.     Breath sounds: Normal breath sounds.  Abdominal:     General: Bowel sounds are normal.     Palpations: Abdomen is soft.     Tenderness: There is no abdominal tenderness.  Musculoskeletal:        General: No swelling or tenderness.     Cervical back: Neck supple. No tenderness.  Lymphadenopathy:     Cervical: No cervical adenopathy.  Skin:    Findings: No erythema or rash.  Neurological:     Mental Status: He is alert.  Psychiatric:        Mood and Affect: Mood normal.        Behavior: Behavior normal.         Outpatient Encounter Medications as of 06/08/2023  Medication Sig   amLODipine  (NORVASC ) 2.5 MG tablet TAKE 1 TABLET(2.5 MG) BY MOUTH DAILY   aspirin EC 81 MG tablet Take 81 mg by mouth daily.   busPIRone  (BUSPAR ) 5 MG tablet Take 1 tablet (5 mg total) by mouth 2 (two) times daily as needed.   Cholecalciferol (VITAMIN D -1000 MAX ST) 25 MCG (1000 UT) tablet Take  2,000 Units by mouth 2 (two) times daily.   doxylamine, Sleep, (UNISOM) 25 MG tablet Take 25 mg by mouth at bedtime as needed.   EPINEPHrine  (EPIPEN  2-PAK) 0.3 mg/0.3 mL IJ SOAJ injection Inject 0.3 mg into the muscle as  needed for anaphylaxis.   famotidine (PEPCID) 20 MG tablet Take 20 mg by mouth daily.   losartan  (COZAAR ) 100 MG tablet Take 1 tablet (100 mg total) by mouth daily.   pantoprazole  (PROTONIX ) 40 MG tablet TAKE 1 TABLET BY MOUTH EVERY DAY   rosuvastatin  (CRESTOR ) 10 MG tablet Take 1 tablet (10 mg total) by mouth daily.   [DISCONTINUED] busPIRone  (BUSPAR ) 5 MG tablet Take 1 tablet (5 mg total) by mouth daily.   No facility-administered encounter medications on file as of 06/08/2023.     Lab Results  Component Value Date   WBC 3.8 (L) 05/24/2023   HGB 12.8 (L) 05/24/2023   HCT 37.0 (L) 05/24/2023   PLT 253.0 05/24/2023   GLUCOSE 88 04/21/2023   CHOL 173 04/21/2023   TRIG 106.0 04/21/2023   HDL 87.00 04/21/2023   LDLDIRECT 144.2 08/22/2012   LDLCALC 65 04/21/2023   ALT 22 04/21/2023   AST 24 04/21/2023   NA 137 04/21/2023   K 4.4 04/21/2023   CL 101 04/21/2023   CREATININE 0.83 04/21/2023   BUN 14 04/21/2023   CO2 29 04/21/2023   TSH 1.01 04/21/2023   PSA 2.82 04/21/2023    MR BRAIN W WO CONTRAST Result Date: 05/23/2022 CLINICAL DATA:  Provided history: Syncope, unspecified syncope type. EXAM: MRI HEAD WITHOUT AND WITH CONTRAST TECHNIQUE: Multiplanar, multiecho pulse sequences of the brain and surrounding structures were obtained without and with intravenous contrast. CONTRAST:  6mL GADAVIST  GADOBUTROL  1 MMOL/ML IV SOLN COMPARISON:  No pertinent prior exams available for comparison. FINDINGS: Brain: No age advanced or lobar predominant parenchymal atrophy. Punctate chronic microhemorrhage within the right parietal lobe. Prominent perivascular spaces within the inferior left basal ganglia. No cortical encephalomalacia is identified. No significant cerebral white matter  disease for age. There is no acute infarct. No evidence of an intracranial mass. No extra-axial fluid collection. No midline shift. No pathologic intracranial enhancement identified. Vascular: Maintained flow voids within the proximal large arterial vessels. Skull and upper cervical spine: No focal suspicious marrow lesion. Sinuses/Orbits: No mass or acute finding within the imaged orbits. No significant paranasal sinus disease. IMPRESSION: 1. No evidence of an acute intracranial abnormality. 2. Punctate chronic microhemorrhage within the right parietal lobe. 3. Otherwise unremarkable MRI appearance of the brain for age. Electronically Signed   By: Bascom Lily D.O.   On: 05/23/2022 18:20       Assessment & Plan:  Essential (primary) hypertension Assessment & Plan: Continue amlodipine  and losartan .  Follow pressures. Blood pressure as outlined. No changes in medication today. Follow.    Anemia, unspecified type Assessment & Plan: Recent hgb decreased.  Recheck cbc today. Also check B12 and iron studies.   Orders: -     CBC with Differential/Platelet -     Vitamin B12 -     IBC + Ferritin  Vitamin D  deficiency, unspecified Assessment & Plan: Recheck vitamin D  level today.   Orders: -     VITAMIN D  25 Hydroxy (Vit-D Deficiency, Fractures)  Leukopenia, unspecified type Assessment & Plan: Recheck cbc today.    Pure hypercholesterolemia -     Hepatic function panel -     Lipid panel -     Basic metabolic panel with GFR  Stress Assessment & Plan: Recently started on buspar . Does feel better. Sleep issues as outlined. Will have him take 5mg  buspar  q hs.  1/2 tablet in afternoon. Call with update. Follow. Overall doing better.    Other orders -  busPIRone  HCl; Take 1 tablet (5 mg total) by mouth 2 (two) times daily as needed.  Dispense: 60 tablet; Refill: 2     Dellar Fenton, MD

## 2023-06-08 NOTE — Assessment & Plan Note (Signed)
 Recheck cbc today.

## 2023-06-08 NOTE — Addendum Note (Signed)
 Addended by: Victorino Grates D on: 06/08/2023 10:21 AM   Modules accepted: Orders

## 2023-06-08 NOTE — Assessment & Plan Note (Signed)
 Continue amlodipine  and losartan .  Follow pressures. Blood pressure as outlined. No changes in medication today. Follow.

## 2023-06-08 NOTE — Assessment & Plan Note (Signed)
 Recheck vitamin  D level today.

## 2023-06-13 ENCOUNTER — Ambulatory Visit

## 2023-06-13 DIAGNOSIS — E538 Deficiency of other specified B group vitamins: Secondary | ICD-10-CM

## 2023-06-13 MED ORDER — CYANOCOBALAMIN 1000 MCG/ML IJ SOLN
1000.0000 ug | Freq: Once | INTRAMUSCULAR | Status: AC
  Administered 2023-06-13: 1000 ug via INTRAMUSCULAR

## 2023-06-13 NOTE — Progress Notes (Signed)
 Patient presented for B 12 injection to left deltoid, patient voiced no concerns nor showed any signs of distress during injection    Pt also wanted to learn how to be able to give the b12 injection himself at home. I explained to him that he will have to see if wife is agreeable to giving it to him, because he can not give it to himself.

## 2023-06-21 ENCOUNTER — Ambulatory Visit (INDEPENDENT_AMBULATORY_CARE_PROVIDER_SITE_OTHER)

## 2023-06-21 DIAGNOSIS — E538 Deficiency of other specified B group vitamins: Secondary | ICD-10-CM

## 2023-06-21 MED ORDER — CYANOCOBALAMIN 1000 MCG/ML IJ SOLN
1000.0000 ug | Freq: Once | INTRAMUSCULAR | Status: AC
  Administered 2023-06-21: 1000 ug via INTRAMUSCULAR

## 2023-06-21 NOTE — Progress Notes (Addendum)
 Patient was administered a B12 injection into her right deltoid. Patient tolerated the injection well.

## 2023-06-28 ENCOUNTER — Ambulatory Visit (INDEPENDENT_AMBULATORY_CARE_PROVIDER_SITE_OTHER)

## 2023-06-28 DIAGNOSIS — E538 Deficiency of other specified B group vitamins: Secondary | ICD-10-CM

## 2023-06-28 MED ORDER — CYANOCOBALAMIN 1000 MCG/ML IJ SOLN
1000.0000 ug | Freq: Once | INTRAMUSCULAR | Status: AC
  Administered 2023-06-28: 1000 ug via INTRAMUSCULAR

## 2023-06-28 NOTE — Progress Notes (Signed)
 Patient was administered into his left deltoid. Patient tolerated the B12 injection well.

## 2023-07-05 ENCOUNTER — Ambulatory Visit (INDEPENDENT_AMBULATORY_CARE_PROVIDER_SITE_OTHER): Admitting: *Deleted

## 2023-07-05 DIAGNOSIS — E538 Deficiency of other specified B group vitamins: Secondary | ICD-10-CM | POA: Diagnosis not present

## 2023-07-05 MED ORDER — CYANOCOBALAMIN 1000 MCG/ML IJ SOLN
1000.0000 ug | Freq: Once | INTRAMUSCULAR | Status: AC
  Administered 2023-07-05: 1000 ug via INTRAMUSCULAR

## 2023-07-05 NOTE — Progress Notes (Addendum)
Pt received B12 injection in right deltoid muscle. Pt tolerated it well with no complaints or concerns.  

## 2023-08-07 ENCOUNTER — Ambulatory Visit (INDEPENDENT_AMBULATORY_CARE_PROVIDER_SITE_OTHER)

## 2023-08-07 DIAGNOSIS — E538 Deficiency of other specified B group vitamins: Secondary | ICD-10-CM

## 2023-08-07 MED ORDER — CYANOCOBALAMIN 1000 MCG/ML IJ SOLN
1000.0000 ug | Freq: Once | INTRAMUSCULAR | Status: AC
  Administered 2023-08-07: 1000 ug via INTRAMUSCULAR

## 2023-08-07 NOTE — Progress Notes (Signed)
 Patient is in office today for a nurse visit for B12 Injection. Patient Injection was given in the  Right deltoid. Patient tolerated injection well.

## 2023-08-16 ENCOUNTER — Telehealth: Payer: Self-pay | Admitting: Internal Medicine

## 2023-08-16 DIAGNOSIS — E7800 Pure hypercholesterolemia, unspecified: Secondary | ICD-10-CM

## 2023-08-16 DIAGNOSIS — I10 Essential (primary) hypertension: Secondary | ICD-10-CM

## 2023-08-16 DIAGNOSIS — D72819 Decreased white blood cell count, unspecified: Secondary | ICD-10-CM

## 2023-08-16 DIAGNOSIS — R7989 Other specified abnormal findings of blood chemistry: Secondary | ICD-10-CM

## 2023-08-16 NOTE — Telephone Encounter (Signed)
 Lab order needed

## 2023-08-17 NOTE — Telephone Encounter (Signed)
 Orders placed for labs

## 2023-08-28 ENCOUNTER — Ambulatory Visit: Payer: Self-pay | Admitting: Internal Medicine

## 2023-08-28 ENCOUNTER — Other Ambulatory Visit (INDEPENDENT_AMBULATORY_CARE_PROVIDER_SITE_OTHER)

## 2023-08-28 DIAGNOSIS — D72819 Decreased white blood cell count, unspecified: Secondary | ICD-10-CM

## 2023-08-28 DIAGNOSIS — I10 Essential (primary) hypertension: Secondary | ICD-10-CM

## 2023-08-28 DIAGNOSIS — E7800 Pure hypercholesterolemia, unspecified: Secondary | ICD-10-CM

## 2023-08-28 DIAGNOSIS — R7989 Other specified abnormal findings of blood chemistry: Secondary | ICD-10-CM

## 2023-08-28 LAB — CBC WITH DIFFERENTIAL/PLATELET
Basophils Absolute: 0 K/uL (ref 0.0–0.1)
Basophils Relative: 0.8 % (ref 0.0–3.0)
Eosinophils Absolute: 0 K/uL (ref 0.0–0.7)
Eosinophils Relative: 1.2 % (ref 0.0–5.0)
HCT: 38.6 % — ABNORMAL LOW (ref 39.0–52.0)
Hemoglobin: 13.2 g/dL (ref 13.0–17.0)
Lymphocytes Relative: 40.6 % (ref 12.0–46.0)
Lymphs Abs: 1.6 K/uL (ref 0.7–4.0)
MCHC: 34.3 g/dL (ref 30.0–36.0)
MCV: 91.6 fl (ref 78.0–100.0)
Monocytes Absolute: 0.5 K/uL (ref 0.1–1.0)
Monocytes Relative: 12.4 % — ABNORMAL HIGH (ref 3.0–12.0)
Neutro Abs: 1.7 K/uL (ref 1.4–7.7)
Neutrophils Relative %: 45 % (ref 43.0–77.0)
Platelets: 246 K/uL (ref 150.0–400.0)
RBC: 4.21 Mil/uL — ABNORMAL LOW (ref 4.22–5.81)
RDW: 12.2 % (ref 11.5–15.5)
WBC: 3.9 K/uL — ABNORMAL LOW (ref 4.0–10.5)

## 2023-08-28 LAB — BASIC METABOLIC PANEL WITH GFR
BUN: 10 mg/dL (ref 6–23)
CO2: 27 meq/L (ref 19–32)
Calcium: 9 mg/dL (ref 8.4–10.5)
Chloride: 100 meq/L (ref 96–112)
Creatinine, Ser: 0.86 mg/dL (ref 0.40–1.50)
GFR: 86.23 mL/min (ref >60.00)
Glucose, Bld: 89 mg/dL (ref 70–99)
Potassium: 4.6 meq/L (ref 3.5–5.1)
Sodium: 135 meq/L (ref 135–145)

## 2023-08-28 LAB — HEPATIC FUNCTION PANEL
ALT: 34 U/L (ref 0–53)
AST: 34 U/L (ref 0–37)
Albumin: 4.3 g/dL (ref 3.5–5.2)
Alkaline Phosphatase: 55 U/L (ref 39–117)
Bilirubin, Direct: 0.2 mg/dL (ref 0.0–0.3)
Total Bilirubin: 0.6 mg/dL (ref 0.2–1.2)
Total Protein: 6.5 g/dL (ref 6.0–8.3)

## 2023-08-28 LAB — LIPID PANEL
Cholesterol: 116 mg/dL (ref 0–200)
HDL: 54.5 mg/dL (ref >39.00)
LDL Cholesterol: 50 mg/dL (ref 0–99)
NonHDL: 61.03
Total CHOL/HDL Ratio: 2
Triglycerides: 57 mg/dL (ref 0.0–149.0)
VLDL: 11.4 mg/dL (ref 0.0–40.0)

## 2023-08-31 ENCOUNTER — Ambulatory Visit: Admitting: Internal Medicine

## 2023-08-31 VITALS — BP 128/70 | HR 70 | Resp 16 | Ht 72.0 in | Wt 147.8 lb

## 2023-08-31 DIAGNOSIS — R202 Paresthesia of skin: Secondary | ICD-10-CM

## 2023-08-31 DIAGNOSIS — I10 Essential (primary) hypertension: Secondary | ICD-10-CM | POA: Diagnosis not present

## 2023-08-31 DIAGNOSIS — Z87898 Personal history of other specified conditions: Secondary | ICD-10-CM | POA: Diagnosis not present

## 2023-08-31 DIAGNOSIS — D72819 Decreased white blood cell count, unspecified: Secondary | ICD-10-CM | POA: Diagnosis not present

## 2023-08-31 DIAGNOSIS — E7800 Pure hypercholesterolemia, unspecified: Secondary | ICD-10-CM

## 2023-08-31 DIAGNOSIS — R200 Anesthesia of skin: Secondary | ICD-10-CM

## 2023-08-31 DIAGNOSIS — K219 Gastro-esophageal reflux disease without esophagitis: Secondary | ICD-10-CM

## 2023-08-31 DIAGNOSIS — R7989 Other specified abnormal findings of blood chemistry: Secondary | ICD-10-CM

## 2023-08-31 MED ORDER — LOSARTAN POTASSIUM 100 MG PO TABS: 100.0000 mg | ORAL_TABLET | Freq: Every day | ORAL | 2 refills | Status: DC

## 2023-08-31 MED ORDER — AMLODIPINE BESYLATE 2.5 MG PO TABS: ORAL_TABLET | ORAL | 1 refills | Status: DC

## 2023-08-31 MED ORDER — BUSPIRONE HCL 5 MG PO TABS: 5.0000 mg | ORAL_TABLET | Freq: Two times a day (BID) | ORAL | 1 refills | Status: DC | PRN

## 2023-08-31 MED ORDER — ROSUVASTATIN CALCIUM 10 MG PO TABS: 10.0000 mg | ORAL_TABLET | Freq: Every day | ORAL | 3 refills | Status: DC

## 2023-08-31 MED ORDER — PANTOPRAZOLE SODIUM 40 MG PO TBEC: DELAYED_RELEASE_TABLET | ORAL | 3 refills | Status: DC

## 2023-08-31 NOTE — Progress Notes (Signed)
 Subjective:    Patient ID: Alm DELENA Roys, male    DOB: 1950/04/17, 73 y.o.   MRN: [id number]  Patient here for  Chief Complaint  Patient presents with   Medical Management of Chronic Issues    HPI Here for a scheduled follow up - follow up regarding hypercholesterolemia, hypertension and increased stress. Previously started on zoloft . Did not tolerate. Now taking buspar . Appears to be doing well on buspar  on his current regimen. Continues on amlodipine  and losartan . Tries to stay active. Does report increased tingling - hands. Also mid lower legs to feet - tingling. Has noticed worsening over the last few weeks. Discussed possible NCS.    Past Medical History:  Diagnosis Date   Hypercholesterolemia    Hypertension    Leukopenia    benign cyclical   Past Surgical History:  Procedure Laterality Date   WISDOM TOOTH EXTRACTION     Family History  Problem Relation Age of Onset   Heart disease Mother        pacemaker   Diabetes Mother    Heart disease Father        myocardial infarction   Hypertension Father    Diabetes Father    CVA Father    Colon cancer Neg Hx    Prostate cancer Neg Hx    Social History   Socioeconomic History   Marital status: Married    Spouse name: Not on file   Number of children: Not on file   Years of education: Not on file   Highest education level: Bachelor's degree (e.g., BA, AB, BS)  Occupational History    Employer: [employer]  Tobacco Use   Smoking status: Never   Smokeless tobacco: Never  Vaping Use   Vaping status: Never Used  Substance and Sexual Activity   Alcohol use: Yes    Alcohol/week: 0.0 standard drinks of alcohol    Comment: 2-3 beers daily   Drug use: No   Sexual activity: Not on file  Other Topics Concern   Not on file  Social History Narrative   Not on file   Social Drivers of Health   Financial Resource Strain: Patient Declined (08/27/2023)   Overall Financial Resource Strain (CARDIA)    Difficulty of  Paying Living Expenses: Patient declined  Food Insecurity: Patient Declined (08/27/2023)   Hunger Vital Sign    Worried About Running Out of Food in the Last Year: Patient declined    Ran Out of Food in the Last Year: Patient declined  Transportation Needs: Patient Declined (08/27/2023)   PRAPARE - Administrator, Civil Service (Medical): Patient declined    Lack of Transportation (Non-Medical): Patient declined  Physical Activity: Unknown (08/27/2023)   Exercise Vital Sign    Days of Exercise per Week: Patient declined    Minutes of Exercise per Session: Not on file  Recent Concern: Physical Activity - Inactive (05/30/2023)   Exercise Vital Sign    Days of Exercise per Week: 0 days    Minutes of Exercise per Session: 0 min  Stress: Patient Declined (08/27/2023)   [employer] of Occupational Health - Occupational Stress Questionnaire    Feeling of Stress: Patient declined  Social Connections: Unknown (08/27/2023)   Social Connection and Isolation Panel    Frequency of Communication with Friends and Family: Patient declined    Frequency of Social Gatherings with Friends and Family: Patient declined    Attends Religious Services: Patient declined    Active Member of  Clubs or Organizations: Patient declined    Attends Banker Meetings: Not on file    Marital Status: Patient declined     Review of Systems  Constitutional:  Negative for appetite change and unexpected weight change.  HENT:  Negative for congestion and sinus pressure.   Respiratory:  Negative for cough, chest tightness and shortness of breath.   Cardiovascular:  Negative for chest pain, palpitations and leg swelling.  Gastrointestinal:  Negative for abdominal pain, diarrhea, nausea and vomiting.  Genitourinary:  Negative for difficulty urinating and dysuria.  Musculoskeletal:  Negative for joint swelling and myalgias.  Skin:  Negative for color change and rash.  Neurological:  Negative for  dizziness and headaches.  Psychiatric/Behavioral:  Negative for agitation and dysphoric mood.        Objective:     BP 128/70   Pulse 70   Resp 16   Ht 6' (1.829 m)   Wt 147 lb 12.8 oz (67 kg)   SpO2 98%   BMI 20.05 kg/m  Wt Readings from Last 3 Encounters:  08/31/23 147 lb 12.8 oz (67 kg)  06/08/23 147 lb 9.6 oz (67 kg)  05/30/23 148 lb (67.1 kg)    Physical Exam Vitals reviewed.  Constitutional:      General: He is not in acute distress.    Appearance: Normal appearance. He is well-developed.  HENT:     Head: Normocephalic and atraumatic.     Right Ear: External ear normal.     Left Ear: External ear normal.     Mouth/Throat:     Pharynx: No oropharyngeal exudate or posterior oropharyngeal erythema.  Eyes:     General: No scleral icterus.       Right eye: No discharge.        Left eye: No discharge.     Conjunctiva/sclera: Conjunctivae normal.  Cardiovascular:     Rate and Rhythm: Normal rate and regular rhythm.  Pulmonary:     Effort: Pulmonary effort is normal. No respiratory distress.     Breath sounds: Normal breath sounds.  Abdominal:     General: Bowel sounds are normal.     Palpations: Abdomen is soft.     Tenderness: There is no abdominal tenderness.  Musculoskeletal:        General: No swelling or tenderness.     Cervical back: Neck supple. No tenderness.  Lymphadenopathy:     Cervical: No cervical adenopathy.  Skin:    Findings: No erythema or rash.  Neurological:     Mental Status: He is alert.  Psychiatric:        Mood and Affect: Mood normal.        Behavior: Behavior normal.         Outpatient Encounter Medications as of 08/31/2023  Medication Sig   amLODipine  (NORVASC ) 2.5 MG tablet TAKE 1 TABLET(2.5 MG) BY MOUTH DAILY   aspirin EC 81 MG tablet Take 81 mg by mouth daily.   busPIRone  (BUSPAR ) 5 MG tablet Take 1 tablet (5 mg total) by mouth 2 (two) times daily as needed.   Cholecalciferol (VITAMIN D -1000 MAX ST) 25 MCG (1000 UT)  tablet Take 2,000 Units by mouth 2 (two) times daily.   doxylamine, Sleep, (UNISOM) 25 MG tablet Take 25 mg by mouth at bedtime as needed.   EPINEPHrine  (EPIPEN  2-PAK) 0.3 mg/0.3 mL IJ SOAJ injection Inject 0.3 mg into the muscle as needed for anaphylaxis.   famotidine (PEPCID) 20 MG tablet Take 20 mg by  mouth daily.   losartan  (COZAAR ) 100 MG tablet Take 1 tablet (100 mg total) by mouth daily.   pantoprazole  (PROTONIX ) 40 MG tablet TAKE 1 TABLET BY MOUTH EVERY DAY   rosuvastatin  (CRESTOR ) 10 MG tablet Take 1 tablet (10 mg total) by mouth daily.   [DISCONTINUED] amLODipine  (NORVASC ) 2.5 MG tablet TAKE 1 TABLET(2.5 MG) BY MOUTH DAILY   [DISCONTINUED] busPIRone  (BUSPAR ) 5 MG tablet Take 1 tablet (5 mg total) by mouth 2 (two) times daily as needed.   [DISCONTINUED] losartan  (COZAAR ) 100 MG tablet Take 1 tablet (100 mg total) by mouth daily.   [DISCONTINUED] pantoprazole  (PROTONIX ) 40 MG tablet TAKE 1 TABLET BY MOUTH EVERY DAY   [DISCONTINUED] rosuvastatin  (CRESTOR ) 10 MG tablet Take 1 tablet (10 mg total) by mouth daily.   No facility-administered encounter medications on file as of 08/31/2023.     Lab Results  Component Value Date   WBC 3.9 (L) 08/28/2023   HGB 13.2 08/28/2023   HCT 38.6 (L) 08/28/2023   PLT 246.0 08/28/2023   GLUCOSE 89 08/28/2023   CHOL 116 08/28/2023   TRIG 57.0 08/28/2023   HDL 54.50 08/28/2023   LDLDIRECT 144.2 08/22/2012   LDLCALC 50 08/28/2023   ALT 34 08/28/2023   AST 34 08/28/2023   NA 135 08/28/2023   K 4.6 08/28/2023   CL 100 08/28/2023   CREATININE 0.86 08/28/2023   BUN 10 08/28/2023   CO2 27 08/28/2023   TSH 1.01 04/21/2023   PSA 2.82 04/21/2023    MR BRAIN W WO CONTRAST Result Date: 05/23/2022 CLINICAL DATA:  Provided history: Syncope, unspecified syncope type. EXAM: MRI HEAD WITHOUT AND WITH CONTRAST TECHNIQUE: Multiplanar, multiecho pulse sequences of the brain and surrounding structures were obtained without and with intravenous contrast.  CONTRAST:  6mL GADAVIST  GADOBUTROL  1 MMOL/ML IV SOLN COMPARISON:  No pertinent prior exams available for comparison. FINDINGS: Brain: No age advanced or lobar predominant parenchymal atrophy. Punctate chronic microhemorrhage within the right parietal lobe. Prominent perivascular spaces within the inferior left basal ganglia. No cortical encephalomalacia is identified. No significant cerebral white matter disease for age. There is no acute infarct. No evidence of an intracranial mass. No extra-axial fluid collection. No midline shift. No pathologic intracranial enhancement identified. Vascular: Maintained flow voids within the proximal large arterial vessels. Skull and upper cervical spine: No focal suspicious marrow lesion. Sinuses/Orbits: No mass or acute finding within the imaged orbits. No significant paranasal sinus disease. IMPRESSION: 1. No evidence of an acute intracranial abnormality. 2. Punctate chronic microhemorrhage within the right parietal lobe. 3. Otherwise unremarkable MRI appearance of the brain for age. Electronically Signed   By: Rockey Childs D.O.   On: 05/23/2022 18:20       Assessment & Plan:  Gastroesophageal reflux disease, unspecified whether esophagitis present Assessment & Plan: Continues on protonix . No upper symptoms reported.    Essential (primary) hypertension Assessment & Plan: Continue amlodipine  and losartan .  Follow pressures. Blood pressure as outlined. No changes in medication today. Follow.   Orders: -     Basic metabolic panel with GFR; Future  Pure hypercholesterolemia -     Lipid panel; Future -     Hepatic function panel; Future  Abnormal liver function tests Assessment & Plan: Has decreased alcohol intake some. Continue diet and exercise. Follow liver panel.   Orders: -     Hepatic function panel; Future  Leukopenia, unspecified type Assessment & Plan: Follow cbc.   Orders: -     CBC with Differential/Platelet;  Future  H/O  syncope Assessment & Plan: Had f/u with neurology 03/20/23. To review previous w/up - MRI Brain with and without contrast 05/20/2022 - No evidence of an acute intracranial abnormality. Punctate chronic microhemorrhage within the right parietal lobe. 3. Otherwise unremarkable MRI appearance of the brain for age. Sleep deprived EEG 06/11/2022 - This is a normal awake and sleep record, without EEG evidence for focality or epileptiform discharges. Prolonged EEG 09/15/2022 - This is a normal in-office unmonitored EEG study lasting 2 hr 28 min. No typical spell was recorded during this study. No ictal or interictal epileptiform activity was noted.  Did not tolerate trial of lamictal. Off. Concern regarding possible seizure disorder. On no medications. Concern regarding mild sleep apnea. Recommended retest for sleep apnea in June. Continues f/u with neurology.    Numbness and tingling in both hands Assessment & Plan: Noticed in hands and feet and lower legs. Seeing neurology as outlined. Keep f/u. Evaluation - question of need for NCS.    Other orders -     amLODIPine  Besylate; TAKE 1 TABLET(2.5 MG) BY MOUTH DAILY  Dispense: 90 tablet; Refill: 1 -     busPIRone  HCl; Take 1 tablet (5 mg total) by mouth 2 (two) times daily as needed.  Dispense: 180 tablet; Refill: 1 -     Losartan  Potassium; Take 1 tablet (100 mg total) by mouth daily.  Dispense: 90 tablet; Refill: 2 -     Pantoprazole  Sodium; TAKE 1 TABLET BY MOUTH EVERY DAY  Dispense: 90 tablet; Refill: 3 -     Rosuvastatin  Calcium ; Take 1 tablet (10 mg total) by mouth daily.  Dispense: 90 tablet; Refill: 3     Allena Hamilton, MD

## 2023-09-03 ENCOUNTER — Encounter: Payer: Self-pay | Admitting: Internal Medicine

## 2023-09-03 DIAGNOSIS — R202 Paresthesia of skin: Secondary | ICD-10-CM | POA: Insufficient documentation

## 2023-09-03 NOTE — Assessment & Plan Note (Signed)
 Has decreased alcohol intake some. Continue diet and exercise. Follow liver panel.

## 2023-09-03 NOTE — Assessment & Plan Note (Signed)
 Follow cbc.

## 2023-09-03 NOTE — Assessment & Plan Note (Signed)
 Noticed in hands and feet and lower legs. Seeing neurology as outlined. Keep f/u. Evaluation - question of need for NCS.

## 2023-09-03 NOTE — Assessment & Plan Note (Signed)
 Continues on protonix. No upper symptoms reported.

## 2023-09-03 NOTE — Assessment & Plan Note (Signed)
 Had f/u with neurology 03/20/23. To review previous w/up - MRI Brain with and without contrast 05/20/2022 - No evidence of an acute intracranial abnormality. Punctate chronic microhemorrhage within the right parietal lobe. 3. Otherwise unremarkable MRI appearance of the brain for age. Sleep deprived EEG 06/11/2022 - This is a normal awake and sleep record, without EEG evidence for focality or epileptiform discharges. Prolonged EEG 09/15/2022 - This is a normal in-office unmonitored EEG study lasting 2 hr 28 min. No typical spell was recorded during this study. No ictal or interictal epileptiform activity was noted.  Did not tolerate trial of lamictal. Off. Concern regarding possible seizure disorder. On no medications. Concern regarding mild sleep apnea. Recommended retest for sleep apnea in June. Continues f/u with neurology.

## 2023-09-03 NOTE — Assessment & Plan Note (Signed)
 Continue amlodipine  and losartan .  Follow pressures. Blood pressure as outlined. No changes in medication today. Follow.

## 2023-09-04 ENCOUNTER — Encounter: Payer: Self-pay | Admitting: Internal Medicine

## 2023-09-11 ENCOUNTER — Ambulatory Visit (INDEPENDENT_AMBULATORY_CARE_PROVIDER_SITE_OTHER)

## 2023-09-11 ENCOUNTER — Telehealth: Payer: Self-pay | Admitting: Internal Medicine

## 2023-09-11 DIAGNOSIS — E538 Deficiency of other specified B group vitamins: Secondary | ICD-10-CM | POA: Diagnosis not present

## 2023-09-11 MED ORDER — CYANOCOBALAMIN 1000 MCG/ML IJ SOLN: 1000.0000 ug | INTRAMUSCULAR | 0 refills | Status: AC

## 2023-09-11 MED ORDER — CYANOCOBALAMIN 1000 MCG/ML IJ SOLN
1000.0000 ug | Freq: Once | INTRAMUSCULAR | Status: AC
  Administered 2023-09-11 (×2): 1000 ug via INTRAMUSCULAR

## 2023-09-11 MED ORDER — "SYRINGE 25G X 1"" 3 ML MISC": 0 refills | Status: DC

## 2023-09-11 NOTE — Progress Notes (Signed)
 Pt presented for their vitamin B12 injection. Pt was identified through two identifiers. Pt tolerated shot well in their right deltoid.    Pt also brought his granddaughter in for a teaching as pt would like to start receiving his B12 injections at home.  Showed his granddaughter how to clean the area , each injection site, and how to draw the medication from the vial.    Granddaughter verbalized she understood and had no further questions.    Pt would like a script for his B12 to be sent to his pharmacy.

## 2023-09-11 NOTE — Addendum Note (Signed)
 Addended by: Ezzard Ditmer on: 09/11/2023 02:30 PM   Modules accepted: Orders

## 2023-09-11 NOTE — Telephone Encounter (Signed)
 Pt is aware that b12 has been sent to pharmacy.

## 2023-09-11 NOTE — Telephone Encounter (Signed)
 Received notification that his granddaughter was instructed on how to give B12 injections. Ok to send in rx for him to get B12 injections at home - 1000mcg q month.

## 2023-11-20 DIAGNOSIS — R202 Paresthesia of skin: Secondary | ICD-10-CM | POA: Diagnosis not present

## 2023-11-20 DIAGNOSIS — Z1331 Encounter for screening for depression: Secondary | ICD-10-CM | POA: Diagnosis not present

## 2023-11-20 DIAGNOSIS — R200 Anesthesia of skin: Secondary | ICD-10-CM | POA: Diagnosis not present

## 2023-11-20 DIAGNOSIS — G4733 Obstructive sleep apnea (adult) (pediatric): Secondary | ICD-10-CM | POA: Diagnosis not present

## 2023-11-20 DIAGNOSIS — R569 Unspecified convulsions: Secondary | ICD-10-CM | POA: Diagnosis not present

## 2023-12-18 DIAGNOSIS — R202 Paresthesia of skin: Secondary | ICD-10-CM | POA: Diagnosis not present

## 2023-12-18 DIAGNOSIS — R200 Anesthesia of skin: Secondary | ICD-10-CM | POA: Diagnosis not present

## 2023-12-19 DIAGNOSIS — R200 Anesthesia of skin: Secondary | ICD-10-CM | POA: Diagnosis not present

## 2023-12-19 DIAGNOSIS — R202 Paresthesia of skin: Secondary | ICD-10-CM | POA: Diagnosis not present

## 2023-12-20 DIAGNOSIS — L609 Nail disorder, unspecified: Secondary | ICD-10-CM | POA: Diagnosis not present

## 2023-12-20 DIAGNOSIS — L570 Actinic keratosis: Secondary | ICD-10-CM | POA: Diagnosis not present

## 2023-12-20 DIAGNOSIS — D1801 Hemangioma of skin and subcutaneous tissue: Secondary | ICD-10-CM | POA: Diagnosis not present

## 2023-12-20 DIAGNOSIS — L821 Other seborrheic keratosis: Secondary | ICD-10-CM | POA: Diagnosis not present

## 2024-01-03 ENCOUNTER — Other Ambulatory Visit

## 2024-01-03 ENCOUNTER — Ambulatory Visit: Payer: Self-pay | Admitting: Internal Medicine

## 2024-01-03 DIAGNOSIS — R7989 Other specified abnormal findings of blood chemistry: Secondary | ICD-10-CM | POA: Diagnosis not present

## 2024-01-03 DIAGNOSIS — I10 Essential (primary) hypertension: Secondary | ICD-10-CM | POA: Diagnosis not present

## 2024-01-03 DIAGNOSIS — E7800 Pure hypercholesterolemia, unspecified: Secondary | ICD-10-CM

## 2024-01-03 DIAGNOSIS — D72819 Decreased white blood cell count, unspecified: Secondary | ICD-10-CM | POA: Diagnosis not present

## 2024-01-03 LAB — CBC WITH DIFFERENTIAL/PLATELET
Basophils Absolute: 0 K/uL (ref 0.0–0.1)
Basophils Relative: 0.7 % (ref 0.0–3.0)
Eosinophils Absolute: 0 K/uL (ref 0.0–0.7)
Eosinophils Relative: 0.8 % (ref 0.0–5.0)
HCT: 39.8 % (ref 39.0–52.0)
Hemoglobin: 13.6 g/dL (ref 13.0–17.0)
Lymphocytes Relative: 36.6 % (ref 12.0–46.0)
Lymphs Abs: 1.8 K/uL (ref 0.7–4.0)
MCHC: 34.1 g/dL (ref 30.0–36.0)
MCV: 91.3 fl (ref 78.0–100.0)
Monocytes Absolute: 0.5 K/uL (ref 0.1–1.0)
Monocytes Relative: 10.7 % (ref 3.0–12.0)
Neutro Abs: 2.5 K/uL (ref 1.4–7.7)
Neutrophils Relative %: 51.2 % (ref 43.0–77.0)
Platelets: 238 K/uL (ref 150.0–400.0)
RBC: 4.36 Mil/uL (ref 4.22–5.81)
RDW: 12.7 % (ref 11.5–15.5)
WBC: 4.9 K/uL (ref 4.0–10.5)

## 2024-01-03 LAB — HEPATIC FUNCTION PANEL
ALT: 25 U/L (ref 0–53)
AST: 29 U/L (ref 0–37)
Albumin: 4.6 g/dL (ref 3.5–5.2)
Alkaline Phosphatase: 50 U/L (ref 39–117)
Bilirubin, Direct: 0.1 mg/dL (ref 0.0–0.3)
Total Bilirubin: 0.7 mg/dL (ref 0.2–1.2)
Total Protein: 6.7 g/dL (ref 6.0–8.3)

## 2024-01-03 LAB — LIPID PANEL
Cholesterol: 144 mg/dL (ref 0–200)
HDL: 59.3 mg/dL (ref >39.00)
LDL Cholesterol: 70 mg/dL (ref 0–99)
NonHDL: 84.77
Total CHOL/HDL Ratio: 2
Triglycerides: 74 mg/dL (ref 0.0–149.0)
VLDL: 14.8 mg/dL (ref 0.0–40.0)

## 2024-01-03 LAB — BASIC METABOLIC PANEL WITH GFR
BUN: 14 mg/dL (ref 6–23)
CO2: 26 meq/L (ref 19–32)
Calcium: 9.7 mg/dL (ref 8.4–10.5)
Chloride: 97 meq/L (ref 96–112)
Creatinine, Ser: 0.84 mg/dL (ref 0.40–1.50)
GFR: 86.63 mL/min (ref >60.00)
Glucose, Bld: 79 mg/dL (ref 70–99)
Potassium: 4.5 meq/L (ref 3.5–5.1)
Sodium: 134 meq/L — ABNORMAL LOW (ref 135–145)

## 2024-01-05 ENCOUNTER — Ambulatory Visit: Admitting: Internal Medicine

## 2024-01-05 ENCOUNTER — Encounter: Payer: Self-pay | Admitting: Internal Medicine

## 2024-01-05 VITALS — BP 128/74 | HR 73 | Temp 98.1°F | Ht 72.0 in | Wt 148.0 lb

## 2024-01-05 DIAGNOSIS — E559 Vitamin D deficiency, unspecified: Secondary | ICD-10-CM

## 2024-01-05 DIAGNOSIS — I10 Essential (primary) hypertension: Secondary | ICD-10-CM

## 2024-01-05 DIAGNOSIS — Z125 Encounter for screening for malignant neoplasm of prostate: Secondary | ICD-10-CM

## 2024-01-05 DIAGNOSIS — R200 Anesthesia of skin: Secondary | ICD-10-CM

## 2024-01-05 DIAGNOSIS — R7989 Other specified abnormal findings of blood chemistry: Secondary | ICD-10-CM

## 2024-01-05 DIAGNOSIS — K219 Gastro-esophageal reflux disease without esophagitis: Secondary | ICD-10-CM

## 2024-01-05 DIAGNOSIS — E538 Deficiency of other specified B group vitamins: Secondary | ICD-10-CM | POA: Insufficient documentation

## 2024-01-05 DIAGNOSIS — Z87898 Personal history of other specified conditions: Secondary | ICD-10-CM

## 2024-01-05 DIAGNOSIS — D72819 Decreased white blood cell count, unspecified: Secondary | ICD-10-CM

## 2024-01-05 DIAGNOSIS — E7800 Pure hypercholesterolemia, unspecified: Secondary | ICD-10-CM

## 2024-01-05 DIAGNOSIS — F439 Reaction to severe stress, unspecified: Secondary | ICD-10-CM

## 2024-01-05 DIAGNOSIS — Z23 Encounter for immunization: Secondary | ICD-10-CM

## 2024-01-05 DIAGNOSIS — Z0000 Encounter for general adult medical examination without abnormal findings: Secondary | ICD-10-CM

## 2024-01-05 DIAGNOSIS — E871 Hypo-osmolality and hyponatremia: Secondary | ICD-10-CM

## 2024-01-05 MED ORDER — BUSPIRONE HCL 5 MG PO TABS: 5.0000 mg | ORAL_TABLET | Freq: Two times a day (BID) | ORAL | 0 refills | Status: DC | PRN

## 2024-01-05 MED ORDER — AMLODIPINE BESYLATE 2.5 MG PO TABS: ORAL_TABLET | ORAL | 1 refills | Status: DC

## 2024-01-05 NOTE — Assessment & Plan Note (Signed)
 Physical today 01/05/24.  Colonoscopy 12/2017 - normal. PSA 04/2023 - 2.82.

## 2024-01-05 NOTE — Patient Instructions (Addendum)
 Benefiber daily.   Prevnar 20

## 2024-01-05 NOTE — Progress Notes (Unsigned)
 Subjective:    Patient ID: Scott Cohen, male    DOB: 1950/02/18, 73 y.o.   MRN: [id number]  Patient here for  Chief Complaint  Patient presents with  . Annual Exam    HPI Here for a physical exam. Taking buspar  - increased stress. Continues on amlodipine  and losartan . Had f/u with neurology 11/20/23 - f/u seizure disorder. Stable. Recommended NCS - numbness/tingling. 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    Past Medical History:  Diagnosis Date  . Hypercholesterolemia   . Hypertension   . Leukopenia    benign cyclical   Past Surgical History:  Procedure Laterality Date  . WISDOM TOOTH EXTRACTION     Family History  Problem Relation Age of Onset  . Heart disease Mother        pacemaker  . Diabetes Mother   . Heart disease Father        myocardial infarction  . Hypertension Father   . Diabetes Father   . CVA Father   . Colon cancer Neg Hx   . Prostate cancer Neg Hx    Social History   Socioeconomic History  . Marital status: Married    Spouse name: Not on file  . Number of children: Not on file  . Years of education: Not on file  . Highest education level: Bachelor's degree (e.g., BA, AB, BS)  Occupational History    Employer: [employer]  Tobacco Use  . Smoking status: Never  . Smokeless tobacco: Never  Vaping Use  . Vaping status: Never Used  Substance and Sexual Activity  . Alcohol use: Yes    Comment: 2-3 beers daily  . Drug use: Never  . Sexual activity: Not Currently    Birth control/protection: None  Other Topics Concern  . Not on file  Social History Narrative  . Not on file   Social Drivers of Health   Financial Resource Strain: Patient Declined (01/01/2024)   Overall Financial Resource Strain (CARDIA)   . Difficulty of Paying Living Expenses: Patient declined  Food Insecurity: Patient Declined (01/01/2024)   Hunger Vital Sign   . Worried  About Programme Researcher, Broadcasting/film/video in the Last Year: Patient declined   . Ran Out of Food in the Last Year: Patient declined  Transportation Needs: Patient Declined (01/01/2024)   PRAPARE - Transportation   . Lack of Transportation (Medical): Patient declined   . Lack of Transportation (Non-Medical): Patient declined  Physical Activity: Unknown (08/27/2023)   Exercise Vital Sign   . Days of Exercise per Week: Patient declined   . Minutes of Exercise per Session: Not on file  Recent Concern: Physical Activity - Inactive (05/30/2023)   Exercise Vital Sign   . Days of Exercise per Week: 0 days   . Minutes of Exercise per Session: 0 min  Stress: Patient Declined (01/01/2024)   [employer] of Occupational Health - Occupational Stress Questionnaire   . Feeling of Stress: Patient declined  Social Connections: Unknown (01/01/2024)   Social Connection and Isolation Panel   . Frequency of Communication with Friends and Family: Patient declined   . Frequency of Social Gatherings with Friends and Family: Patient declined   . Attends Religious Services: Patient declined   . Active Member of Clubs or Organizations: Patient declined   . Attends Banker Meetings: Not on file   . Marital Status: Married     Review of Systems  Objective:     BP 134/70   Pulse 73   Temp 98.1 F (36.7 C) (Oral)   Ht 6' (1.829 m)   Wt 148 lb (67.1 kg)   SpO2 99%   BMI 20.07 kg/m  Wt Readings from Last 3 Encounters:  01/05/24 148 lb (67.1 kg)  08/31/23 147 lb 12.8 oz (67 kg)  06/08/23 147 lb 9.6 oz (67 kg)    Physical Exam  {Perform Simple Foot Exam  Perform Detailed exam:1} {Insert foot Exam (Optional):30965}   Outpatient Encounter Medications as of 01/05/2024  Medication Sig  . amLODipine  (NORVASC ) 2.5 MG tablet TAKE 1 TABLET(2.5 MG) BY MOUTH DAILY  . aspirin EC 81 MG tablet Take 81 mg by mouth daily.  . busPIRone  (BUSPAR ) 5 MG tablet Take 1 tablet (5 mg total) by mouth 2 (two) times  daily as needed.  . Calcium  Carb-Cholecalciferol (CALCIUM -VITAMIN D3 PO)   . cyanocobalamin  (VITAMIN B12) 1000 MCG/ML injection Inject 1 mL (1,000 mcg total) into the muscle every 30 (thirty) days.  . EPINEPHrine  (EPIPEN  2-PAK) 0.3 mg/0.3 mL IJ SOAJ injection Inject 0.3 mg into the muscle as needed for anaphylaxis.  . famotidine (PEPCID) 20 MG tablet Take 20 mg by mouth daily.  . losartan  (COZAAR ) 100 MG tablet Take 1 tablet (100 mg total) by mouth daily.  . pantoprazole  (PROTONIX ) 40 MG tablet TAKE 1 TABLET BY MOUTH EVERY DAY  . rosuvastatin  (CRESTOR ) 10 MG tablet Take 1 tablet (10 mg total) by mouth daily.  . Syringe/Needle, Disp, (SYRINGE 3CC/25GX1) 25G X 1 3 ML MISC Use to give b12 injection once every 30 days  . [DISCONTINUED] Cholecalciferol (VITAMIN D -1000 MAX ST) 25 MCG (1000 UT) tablet Take 2,000 Units by mouth 2 (two) times daily.  . [DISCONTINUED] doxylamine, Sleep, (UNISOM) 25 MG tablet Take 25 mg by mouth at bedtime as needed.   No facility-administered encounter medications on file as of 01/05/2024.     Lab Results  Component Value Date   WBC 4.9 01/03/2024   HGB 13.6 01/03/2024   HCT 39.8 01/03/2024   PLT 238.0 01/03/2024   GLUCOSE 79 01/03/2024   CHOL 144 01/03/2024   TRIG 74.0 01/03/2024   HDL 59.30 01/03/2024   LDLDIRECT 144.2 08/22/2012   LDLCALC 70 01/03/2024   ALT 25 01/03/2024   AST 29 01/03/2024   NA 134 (L) 01/03/2024   K 4.5 01/03/2024   CL 97 01/03/2024   CREATININE 0.84 01/03/2024   BUN 14 01/03/2024   CO2 26 01/03/2024   TSH 1.01 04/21/2023   PSA 2.82 04/21/2023    MR BRAIN W WO CONTRAST Result Date: 05/23/2022 CLINICAL DATA:  Provided history: Syncope, unspecified syncope type. EXAM: MRI HEAD WITHOUT AND WITH CONTRAST TECHNIQUE: Multiplanar, multiecho pulse sequences of the brain and surrounding structures were obtained without and with intravenous contrast. CONTRAST:  6mL GADAVIST  GADOBUTROL  1 MMOL/ML IV SOLN COMPARISON:  No pertinent prior exams  available for comparison. FINDINGS: Brain: No age advanced or lobar predominant parenchymal atrophy. Punctate chronic microhemorrhage within the right parietal lobe. Prominent perivascular spaces within the inferior left basal ganglia. No cortical encephalomalacia is identified. No significant cerebral white matter disease for age. There is no acute infarct. No evidence of an intracranial mass. No extra-axial fluid collection. No midline shift. No pathologic intracranial enhancement identified. Vascular: Maintained flow voids within the proximal large arterial vessels. Skull and upper cervical spine: No focal suspicious marrow lesion. Sinuses/Orbits: No mass or acute finding within the imaged orbits. No significant paranasal  sinus disease. IMPRESSION: 1. No evidence of an acute intracranial abnormality. 2. Punctate chronic microhemorrhage within the right parietal lobe. 3. Otherwise unremarkable MRI appearance of the brain for age. Electronically Signed   By: Rockey Childs D.O.   On: 05/23/2022 18:20       Assessment & Plan:  Healthcare maintenance Assessment & Plan: Physical today 01/05/24.  Colonoscopy 12/2017 - normal. PSA 04/2023 - 2.82.    Leukopenia, unspecified type  Pure hypercholesterolemia  Abnormal liver function tests  Essential (primary) hypertension  Need for influenza vaccination -     Flu vaccine HIGH DOSE PF(Fluzone Trivalent)     Allena Hamilton, MD

## 2024-01-07 ENCOUNTER — Encounter: Payer: Self-pay | Admitting: Internal Medicine

## 2024-01-07 NOTE — Assessment & Plan Note (Signed)
 Had f/u with neurology 03/20/23. To review previous w/up - MRI Brain with and without contrast 05/20/2022 - No evidence of an acute intracranial abnormality. Punctate chronic microhemorrhage within the right parietal lobe. 3. Otherwise unremarkable MRI appearance of the brain for age. Sleep deprived EEG 06/11/2022 - This is a normal awake and sleep record, without EEG evidence for focality or epileptiform discharges. Prolonged EEG 09/15/2022 - This is a normal in-office unmonitored EEG study lasting 2 hr 28 min. No typical spell was recorded during this study. No ictal or interictal epileptiform activity was noted.  Did not tolerate trial of lamictal. Off. Concern regarding possible seizure disorder. On no medications. Concern regarding mild sleep apnea. Recommended retest for sleep apnea in June.  Need results. Last evaluated 11/20/23 - f/u seizure disorder. Stable.

## 2024-01-07 NOTE — Assessment & Plan Note (Signed)
 Check vitamin D level with next labs.  ?

## 2024-01-07 NOTE — Assessment & Plan Note (Signed)
 Check b12 level with next lab.

## 2024-01-07 NOTE — Assessment & Plan Note (Signed)
 On crestor .  Low cholesterol diet and exercise.  Follow lipid panel and liver function tests.  Discussed recent lab results.  Lab Results  Component Value Date   CHOL 144 01/03/2024   HDL 59.30 01/03/2024   LDLCALC 70 01/03/2024   LDLDIRECT 144.2 08/22/2012   TRIG 74.0 01/03/2024   CHOLHDL 2 01/03/2024

## 2024-01-07 NOTE — Assessment & Plan Note (Signed)
 Has buspar . Overall appears to be doing well. Follow.

## 2024-01-07 NOTE — Assessment & Plan Note (Signed)
No upper symptoms reported.  Protonix.  

## 2024-01-07 NOTE — Assessment & Plan Note (Signed)
 Recent sodium check 134. Discussed the need to decrease alcohol intake. Follow.

## 2024-01-07 NOTE — Assessment & Plan Note (Signed)
Most recent check wnl.   

## 2024-01-07 NOTE — Assessment & Plan Note (Signed)
 Continue amlodipine  and losartan .  Follow pressures. Blood pressure as outlined. No changes in medication today. Follow metabolic panel.

## 2024-01-07 NOTE — Assessment & Plan Note (Signed)
 Had f/u with neurology 11/20/23 - f/u seizure disorder. Stable. Recommended NCS - numbness/tingling. 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 Discussed alcohol intake. Drinks 3-4 beers per day. No binge drinking. Discussed decreasing intake. Also discussed increased work on computer and positioning of arm - controlling the mouse. (May be aggravating).

## 2024-03-06 ENCOUNTER — Telehealth: Payer: Self-pay

## 2024-03-06 NOTE — Telephone Encounter (Signed)
 Copied from CRM #[id number]. Topic: Clinical - Medication Refill >> Mar 06, 2024 12:24 PM Rea ORN wrote: Medication:  losartan  (COZAAR ) 100 MG tablet cyanocobalamin  (VITAMIN B12) 1000 MCG/ML injection  Syringe/Needle, Disp, (SYRINGE 3CC/25GX1) 25G X 1 3 ML MISC busPIRone  (BUSPAR ) 5 MG tablet  amLODipine  (NORVASC ) 2.5 MG tablet rosuvastatin  (CRESTOR ) 10 MG tablet pantoprazole  (PROTONIX ) 40 MG tablet  * 90 day supply requested   Has the patient contacted their pharmacy? Yes (Agent: If no, request that the patient contact the pharmacy for the refill. If patient does not wish to contact the pharmacy document the reason why and proceed with request.) (Agent: If yes, when and what did the pharmacy advise?)  This is the patient's preferred pharmacy:  [facility] - [place], [place] - [address] [place] [address] Phone: [phone] Fax: [phone]  Is this the correct pharmacy for this prescription? Yes If no, delete pharmacy and type the correct one.   Has the prescription been filled recently? No  Is the patient out of the medication? No  Has the patient been seen for an appointment in the last year OR does the patient have an upcoming appointment? Yes  Can we respond through MyChart? No  Agent: Please be advised that Rx refills may take up to 3 business days. We ask that you follow-up with your pharmacy.

## 2024-03-07 MED ORDER — BUSPIRONE HCL 5 MG PO TABS: 5.0000 mg | ORAL_TABLET | Freq: Two times a day (BID) | ORAL | 0 refills | Status: AC | PRN

## 2024-03-07 MED ORDER — ROSUVASTATIN CALCIUM 10 MG PO TABS: 10.0000 mg | ORAL_TABLET | Freq: Every day | ORAL | 1 refills | Status: AC

## 2024-03-07 MED ORDER — "SYRINGE 25G X 1"" 3 ML MISC": 0 refills | Status: AC

## 2024-03-07 MED ORDER — AMLODIPINE BESYLATE 2.5 MG PO TABS: ORAL_TABLET | ORAL | 1 refills | Status: AC

## 2024-03-07 MED ORDER — LOSARTAN POTASSIUM 100 MG PO TABS: 100.0000 mg | ORAL_TABLET | Freq: Every day | ORAL | 1 refills | Status: AC

## 2024-03-07 MED ORDER — PANTOPRAZOLE SODIUM 40 MG PO TBEC: DELAYED_RELEASE_TABLET | ORAL | 1 refills | Status: AC

## 2024-03-07 NOTE — Telephone Encounter (Signed)
 Refill sent for pt

## 2024-03-07 NOTE — Addendum Note (Signed)
 Addended by: Iverna Hammac on: 03/07/2024 12:13 PM   Modules accepted: Orders

## 2024-05-07 ENCOUNTER — Other Ambulatory Visit

## 2024-05-09 ENCOUNTER — Ambulatory Visit: Admitting: Internal Medicine

## 2024-05-09 ENCOUNTER — Other Ambulatory Visit

## 2024-05-13 ENCOUNTER — Ambulatory Visit: Admitting: Internal Medicine

## 2024-06-04 ENCOUNTER — Ambulatory Visit
